# Patient Record
Sex: Female | Born: 1935 | Race: Black or African American | Hispanic: No | Smoking: Never smoker
Health system: Southern US, Community
[De-identification: ages and names within clinical notes are randomized; demographics above are authoritative.]

## PROBLEM LIST (undated history)

## (undated) DIAGNOSIS — R7611 Nonspecific reaction to tuberculin skin test without active tuberculosis: Secondary | ICD-10-CM

## (undated) DIAGNOSIS — D649 Anemia, unspecified: Secondary | ICD-10-CM

## (undated) DIAGNOSIS — E876 Hypokalemia: Secondary | ICD-10-CM

## (undated) DIAGNOSIS — I10 Essential (primary) hypertension: Secondary | ICD-10-CM

## (undated) DIAGNOSIS — M810 Age-related osteoporosis without current pathological fracture: Secondary | ICD-10-CM

## (undated) DIAGNOSIS — F0390 Unspecified dementia without behavioral disturbance: Secondary | ICD-10-CM

## (undated) DIAGNOSIS — I951 Orthostatic hypotension: Secondary | ICD-10-CM

## (undated) DIAGNOSIS — R7303 Prediabetes: Secondary | ICD-10-CM

## (undated) DIAGNOSIS — Z8781 Personal history of (healed) traumatic fracture: Secondary | ICD-10-CM

## (undated) HISTORY — PX: APPENDECTOMY: SHX54

---

## 2005-01-14 ENCOUNTER — Ambulatory Visit: Payer: Self-pay | Admitting: Family Medicine

## 2006-02-27 ENCOUNTER — Ambulatory Visit: Payer: Self-pay | Admitting: Nurse Practitioner

## 2007-03-11 ENCOUNTER — Ambulatory Visit: Payer: Self-pay | Admitting: Nurse Practitioner

## 2007-10-30 ENCOUNTER — Ambulatory Visit: Payer: Self-pay | Admitting: Gastroenterology

## 2008-03-29 ENCOUNTER — Ambulatory Visit: Payer: Self-pay | Admitting: Family Medicine

## 2009-03-30 ENCOUNTER — Ambulatory Visit: Payer: Self-pay | Admitting: Family Medicine

## 2010-04-03 ENCOUNTER — Ambulatory Visit: Payer: Self-pay | Admitting: Family Medicine

## 2010-05-10 ENCOUNTER — Ambulatory Visit: Payer: Self-pay | Admitting: Family Medicine

## 2010-05-18 ENCOUNTER — Ambulatory Visit: Payer: Self-pay | Admitting: Gynecologic Oncology

## 2010-05-29 ENCOUNTER — Ambulatory Visit: Payer: Self-pay | Admitting: Gynecologic Oncology

## 2010-06-01 LAB — PATHOLOGY REPORT

## 2010-06-18 ENCOUNTER — Ambulatory Visit: Payer: Self-pay | Admitting: Gynecologic Oncology

## 2011-05-31 ENCOUNTER — Ambulatory Visit: Payer: Self-pay | Admitting: Family Medicine

## 2012-06-02 ENCOUNTER — Ambulatory Visit: Payer: Self-pay | Admitting: Family Medicine

## 2013-06-03 ENCOUNTER — Ambulatory Visit: Payer: Self-pay | Admitting: Family Medicine

## 2014-05-25 ENCOUNTER — Ambulatory Visit: Payer: Self-pay | Admitting: Family Medicine

## 2014-06-06 ENCOUNTER — Ambulatory Visit: Payer: Self-pay | Admitting: Family Medicine

## 2016-11-01 ENCOUNTER — Emergency Department
Admission: EM | Admit: 2016-11-01 | Discharge: 2016-11-01 | Disposition: A | Discharge status: Home / Self Care | Payer: Medicare Other | Attending: Emergency Medicine | Admitting: Emergency Medicine

## 2016-11-01 ENCOUNTER — Emergency Department: Payer: Medicare Other

## 2016-11-01 ENCOUNTER — Encounter: Payer: Self-pay | Admitting: Emergency Medicine

## 2016-11-01 DIAGNOSIS — M79604 Pain in right leg: Secondary | ICD-10-CM | POA: Diagnosis present

## 2016-11-01 DIAGNOSIS — I10 Essential (primary) hypertension: Secondary | ICD-10-CM | POA: Diagnosis not present

## 2016-11-01 HISTORY — DX: Essential (primary) hypertension: I10
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
## 2016-11-24 ENCOUNTER — Encounter: Payer: Self-pay | Admitting: Emergency Medicine

## 2016-11-24 ENCOUNTER — Emergency Department
Admission: EM | Admit: 2016-11-24 | Discharge: 2016-11-24 | Disposition: A | Discharge status: Home / Self Care | Payer: Medicare Other | Attending: Emergency Medicine | Admitting: Emergency Medicine

## 2016-11-24 DIAGNOSIS — R4182 Altered mental status, unspecified: Secondary | ICD-10-CM | POA: Diagnosis present

## 2016-11-24 DIAGNOSIS — I10 Essential (primary) hypertension: Secondary | ICD-10-CM | POA: Insufficient documentation

## 2016-11-24 DIAGNOSIS — F0390 Unspecified dementia without behavioral disturbance: Secondary | ICD-10-CM | POA: Diagnosis not present

## 2016-11-24 LAB — BASIC METABOLIC PANEL
Anion gap: 8 (ref 5–15)
BUN: 15 mg/dL (ref 6–20)
CO2: 31 mmol/L (ref 22–32)
CREATININE: 0.62 mg/dL (ref 0.44–1.00)
Calcium: 11.9 mg/dL — ABNORMAL HIGH (ref 8.9–10.3)
Chloride: 105 mmol/L (ref 101–111)
GFR calc Af Amer: >60 mL/min (ref >60)
Glucose, Bld: 103 mg/dL — ABNORMAL HIGH (ref 65–99)
POTASSIUM: 3.4 mmol/L — AB (ref 3.5–5.1)
SODIUM: 144 mmol/L (ref 135–145)

## 2016-11-24 LAB — CBC WITH DIFFERENTIAL/PLATELET
BASOS PCT: 1 %
Basophils Absolute: 0.1 10*3/uL (ref 0–0.1)
EOS ABS: 0.1 10*3/uL (ref 0–0.7)
EOS PCT: 1 %
HCT: 47 % (ref 35.0–47.0)
Hemoglobin: 15.6 g/dL (ref 12.0–16.0)
LYMPHS ABS: 1.4 10*3/uL (ref 1.0–3.6)
Lymphocytes Relative: 17 %
MCH: 29.7 pg (ref 26.0–34.0)
MCHC: 33.3 g/dL (ref 32.0–36.0)
MCV: 89.2 fL (ref 80.0–100.0)
Monocytes Absolute: 0.4 10*3/uL (ref 0.2–0.9)
Monocytes Relative: 5 %
Neutro Abs: 6.2 10*3/uL (ref 1.4–6.5)
Neutrophils Relative %: 76 %
PLATELETS: 188 10*3/uL (ref 150–440)
RBC: 5.27 MIL/uL — AB (ref 3.80–5.20)
RDW: 15.2 % — ABNORMAL HIGH (ref 11.5–14.5)
WBC: 8.2 10*3/uL (ref 3.6–11.0)

## 2016-11-24 LAB — URINALYSIS, COMPLETE (UACMP) WITH MICROSCOPIC
Bacteria, UA: NONE SEEN
Bilirubin Urine: NEGATIVE
GLUCOSE, UA: NEGATIVE mg/dL
Ketones, ur: NEGATIVE mg/dL
Leukocytes, UA: NEGATIVE
Nitrite: NEGATIVE
PH: 8 (ref 5.0–8.0)
Protein, ur: 100 mg/dL — AB
Specific Gravity, Urine: 1.008 (ref 1.005–1.030)

## 2016-11-24 MED ORDER — LORAZEPAM 1 MG PO TABS: 1.0000 mg | ORAL_TABLET | Freq: Three times a day (TID) | ORAL | 0 refills | Status: DC | PRN

## 2016-11-24 NOTE — ED Notes (Signed)
Pt & family requesting psych eval while in ED today. EDP notified.
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
## 2016-11-24 NOTE — ED Notes (Signed)

## 2016-11-24 NOTE — Discharge Instructions (Signed)
Please seek medical attention for any high fevers, chest pain, shortness of breath, change in behavior, persistent vomiting, bloody stool or any other new or concerning symptoms.  
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
## 2016-12-03 ENCOUNTER — Encounter: Payer: Self-pay | Admitting: Emergency Medicine

## 2016-12-03 ENCOUNTER — Emergency Department
Admission: EM | Admit: 2016-12-03 | Discharge: 2016-12-05 | Disposition: A | Discharge status: Home / Self Care | Payer: Medicare Other | Attending: Emergency Medicine | Admitting: Emergency Medicine

## 2016-12-03 DIAGNOSIS — Z5181 Encounter for therapeutic drug level monitoring: Secondary | ICD-10-CM | POA: Insufficient documentation

## 2016-12-03 DIAGNOSIS — G309 Alzheimer's disease, unspecified: Secondary | ICD-10-CM

## 2016-12-03 DIAGNOSIS — F22 Delusional disorders: Secondary | ICD-10-CM

## 2016-12-03 DIAGNOSIS — F0280 Dementia in other diseases classified elsewhere without behavioral disturbance: Secondary | ICD-10-CM

## 2016-12-03 DIAGNOSIS — I10 Essential (primary) hypertension: Secondary | ICD-10-CM | POA: Diagnosis not present

## 2016-12-03 HISTORY — DX: Age-related osteoporosis without current pathological fracture: M81.0

## 2016-12-03 HISTORY — DX: Prediabetes: R73.03

## 2016-12-03 HISTORY — DX: Unspecified dementia, unspecified severity, without behavioral disturbance, psychotic disturbance, mood disturbance, and anxiety: F03.90

## 2016-12-03 HISTORY — DX: Hypokalemia: E87.6

## 2016-12-03 HISTORY — DX: Hypercalcemia: E83.52

## 2016-12-03 LAB — COMPREHENSIVE METABOLIC PANEL
ALBUMIN: 4.3 g/dL (ref 3.5–5.0)
ALK PHOS: 83 U/L (ref 38–126)
ALT: 13 U/L — ABNORMAL LOW (ref 14–54)
ANION GAP: 7 (ref 5–15)
AST: 22 U/L (ref 15–41)
BUN: 16 mg/dL (ref 6–20)
CALCIUM: 11.4 mg/dL — AB (ref 8.9–10.3)
CO2: 29 mmol/L (ref 22–32)
CREATININE: 0.66 mg/dL (ref 0.44–1.00)
Chloride: 106 mmol/L (ref 101–111)
GFR calc Af Amer: >60 mL/min (ref >60)
GFR calc non Af Amer: >60 mL/min (ref >60)
GLUCOSE: 157 mg/dL — AB (ref 65–99)
Potassium: 3.7 mmol/L (ref 3.5–5.1)
SODIUM: 142 mmol/L (ref 135–145)
Total Bilirubin: 0.6 mg/dL (ref 0.3–1.2)
Total Protein: 7.5 g/dL (ref 6.5–8.1)

## 2016-12-03 LAB — URINALYSIS, ROUTINE W REFLEX MICROSCOPIC
BILIRUBIN URINE: NEGATIVE
Glucose, UA: NEGATIVE mg/dL
KETONES UR: NEGATIVE mg/dL
Nitrite: NEGATIVE
Protein, ur: NEGATIVE mg/dL
SPECIFIC GRAVITY, URINE: 1.012 (ref 1.005–1.030)
pH: 7 (ref 5.0–8.0)

## 2016-12-03 LAB — CBC
HEMATOCRIT: 47.6 % — AB (ref 35.0–47.0)
HEMOGLOBIN: 15.7 g/dL (ref 12.0–16.0)
MCH: 29.8 pg (ref 26.0–34.0)
MCHC: 32.9 g/dL (ref 32.0–36.0)
MCV: 90.6 fL (ref 80.0–100.0)
Platelets: 166 10*3/uL (ref 150–440)
RBC: 5.25 MIL/uL — ABNORMAL HIGH (ref 3.80–5.20)
RDW: 14.8 % — ABNORMAL HIGH (ref 11.5–14.5)
WBC: 9.6 10*3/uL (ref 3.6–11.0)

## 2016-12-03 LAB — ETHANOL: Alcohol, Ethyl (B): <5 mg/dL (ref <5)

## 2016-12-03 LAB — URINE DRUG SCREEN, QUALITATIVE (ARMC ONLY)
Amphetamines, Ur Screen: NOT DETECTED
BARBITURATES, UR SCREEN: NOT DETECTED
Benzodiazepine, Ur Scrn: NOT DETECTED
COCAINE METABOLITE, UR ~~LOC~~: NOT DETECTED
Cannabinoid 50 Ng, Ur ~~LOC~~: NOT DETECTED
MDMA (Ecstasy)Ur Screen: NOT DETECTED
METHADONE SCREEN, URINE: NOT DETECTED
OPIATE, UR SCREEN: NOT DETECTED
Phencyclidine (PCP) Ur S: NOT DETECTED
Tricyclic, Ur Screen: NOT DETECTED

## 2016-12-03 LAB — ACETAMINOPHEN LEVEL

## 2016-12-03 LAB — SALICYLATE LEVEL: Salicylate Lvl: <7 mg/dL (ref 2.8–30.0)
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
## 2016-12-03 NOTE — ED Notes (Signed)
Patient dressed out into hospital scrubs and clothes put in bag.  Bag with clothes and purse given to sister to take home.  Sister in room during process.
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
## 2016-12-04 DIAGNOSIS — G309 Alzheimer's disease, unspecified: Secondary | ICD-10-CM

## 2016-12-04 DIAGNOSIS — G301 Alzheimer's disease with late onset: Secondary | ICD-10-CM

## 2016-12-04 DIAGNOSIS — F0280 Dementia in other diseases classified elsewhere without behavioral disturbance: Secondary | ICD-10-CM
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
## 2016-12-04 NOTE — ED Notes (Signed)
Patient ambulatory to bathroom. Patient now back resting in the bed

## 2016-12-04 NOTE — ED Notes (Signed)
Resumed care of pt at this time   Paperwork shows that she will discharge to home
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
## 2016-12-04 NOTE — ED Notes (Signed)
IVC  RESCINDED  PER  DR  CLAPACS 
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
## 2016-12-04 NOTE — ED Notes (Signed)
BEHAVIORAL HEALTH ROUNDING Patient sleeping: No. Patient alert and oriented: yes Behavior appropriate: Yes.  ; If no, describe:  Nutrition and fluids offered: yes Toileting and hygiene offered: Yes  Sitter present: q15 minute observations and security  monitoring Law enforcement present: Yes  ODS  

## 2016-12-04 NOTE — ED Provider Notes (Signed)
Patient has been cleared by psychiatry for outpatient follow-up. She is not felt to be a threat to herself or others at this time.   Yesenia FilbertJonathan E Williams, MD 12/04/16 [phone]

## 2016-12-05 NOTE — ED Notes (Signed)
Per Larita FifeLynn, CSW, sister states they will try to pick up pt this afternoon, but it may be tomorrow, based on the weather and road situation.

## 2016-12-05 NOTE — ED Notes (Signed)
Pt discharged home after verbalizing understanding of discharge instructions; nad noted. 
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
## 2016-12-05 NOTE — ED Notes (Signed)
Pt's sister called to check on pt. States she cannot get here to pick pt up and wanted to know situation. Told her that a social work consult has been ordered. Her name is Isabella BowensLacheata Hall, phone [phone]

## 2016-12-05 NOTE — ED Notes (Signed)
Resumed care from [place], [place]. Pt resting comfortably. Will continue to monitor.
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
## 2016-12-05 NOTE — ED Notes (Signed)
Pt. ambulates without assistance.

## 2016-12-05 NOTE — ED Notes (Signed)
PT  VOL  PENDING  D/C ?

## 2016-12-10 ENCOUNTER — Encounter: Payer: Self-pay | Admitting: Emergency Medicine

## 2016-12-10 ENCOUNTER — Emergency Department
Admission: EM | Admit: 2016-12-10 | Discharge: 2016-12-14 | Disposition: A | Discharge status: Home / Self Care | Payer: Medicare Other | Attending: Emergency Medicine | Admitting: Emergency Medicine

## 2016-12-10 DIAGNOSIS — Z5900 Homelessness unspecified: Secondary | ICD-10-CM

## 2016-12-10 DIAGNOSIS — R509 Fever, unspecified: Secondary | ICD-10-CM | POA: Insufficient documentation

## 2016-12-10 DIAGNOSIS — I10 Essential (primary) hypertension: Secondary | ICD-10-CM | POA: Insufficient documentation

## 2016-12-10 DIAGNOSIS — R197 Diarrhea, unspecified: Secondary | ICD-10-CM | POA: Diagnosis not present

## 2016-12-10 DIAGNOSIS — Z7982 Long term (current) use of aspirin: Secondary | ICD-10-CM | POA: Diagnosis not present

## 2016-12-10 DIAGNOSIS — F0280 Dementia in other diseases classified elsewhere without behavioral disturbance: Secondary | ICD-10-CM | POA: Insufficient documentation

## 2016-12-10 DIAGNOSIS — R2681 Unsteadiness on feet: Secondary | ICD-10-CM | POA: Insufficient documentation

## 2016-12-10 DIAGNOSIS — R109 Unspecified abdominal pain: Secondary | ICD-10-CM | POA: Diagnosis not present

## 2016-12-10 DIAGNOSIS — Z79899 Other long term (current) drug therapy: Secondary | ICD-10-CM | POA: Insufficient documentation

## 2016-12-10 DIAGNOSIS — N289 Disorder of kidney and ureter, unspecified: Secondary | ICD-10-CM

## 2016-12-10 DIAGNOSIS — G308 Other Alzheimer's disease: Secondary | ICD-10-CM | POA: Diagnosis not present

## 2016-12-10 DIAGNOSIS — N390 Urinary tract infection, site not specified: Secondary | ICD-10-CM

## 2016-12-10 DIAGNOSIS — F0390 Unspecified dementia without behavioral disturbance: Secondary | ICD-10-CM

## 2016-12-10 LAB — CBC
HCT: 44.3 % (ref 35.0–47.0)
HEMOGLOBIN: 14.7 g/dL (ref 12.0–16.0)
MCH: 29.8 pg (ref 26.0–34.0)
MCHC: 33.3 g/dL (ref 32.0–36.0)
MCV: 89.7 fL (ref 80.0–100.0)
Platelets: 153 10*3/uL (ref 150–440)
RBC: 4.94 MIL/uL (ref 3.80–5.20)
RDW: 14.8 % — ABNORMAL HIGH (ref 11.5–14.5)
WBC: 6.8 10*3/uL (ref 3.6–11.0)

## 2016-12-10 LAB — COMPREHENSIVE METABOLIC PANEL
ALK PHOS: 65 U/L (ref 38–126)
ALT: 19 U/L (ref 14–54)
ANION GAP: 8 (ref 5–15)
AST: 30 U/L (ref 15–41)
Albumin: 4 g/dL (ref 3.5–5.0)
BUN: 36 mg/dL — ABNORMAL HIGH (ref 6–20)
CALCIUM: 10.8 mg/dL — AB (ref 8.9–10.3)
CO2: 26 mmol/L (ref 22–32)
Chloride: 107 mmol/L (ref 101–111)
Creatinine, Ser: 1.1 mg/dL — ABNORMAL HIGH (ref 0.44–1.00)
GFR, EST AFRICAN AMERICAN: 50 mL/min — AB (ref >60)
GFR, EST NON AFRICAN AMERICAN: 43 mL/min — AB (ref >60)
Glucose, Bld: 136 mg/dL — ABNORMAL HIGH (ref 65–99)
Potassium: 3.5 mmol/L (ref 3.5–5.1)
SODIUM: 141 mmol/L (ref 135–145)
Total Bilirubin: 0.9 mg/dL (ref 0.3–1.2)
Total Protein: 7.1 g/dL (ref 6.5–8.1)

## 2016-12-10 LAB — LIPASE, BLOOD: LIPASE: 14 U/L (ref 11–51)

## 2016-12-10 LAB — INFLUENZA PANEL BY PCR (TYPE A & B)
INFLAPCR: NEGATIVE
INFLBPCR: NEGATIVE

## 2016-12-10 MED ORDER — SODIUM CHLORIDE 0.9 % IV BOLUS (SEPSIS)
1000.0000 mL | Freq: Once | INTRAVENOUS | Status: AC
Start: 2016-12-10 — End: 2016-12-10
  Administered 2016-12-10: 1000 mL via INTRAVENOUS
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
## 2016-12-10 NOTE — NC FL2 (Signed)
  [place] MEDICAID FL2 LEVEL OF CARE SCREENING TOOL     IDENTIFICATION  Patient Name: Izora GalaDoris O Moon Birthdate: Sep 30, 1924 Sex: female Admission Date (Current Location): 12/10/2016  [place]   Facility and Address:  [facility], [address], [place], [place] [address]      Provider Number: [id number]  Attending Physician Name and Address:  Rockne MenghiniAnne-Caroline Norman, MD  Relative Name and Phone Number:       Current Level of Care: Hospital Recommended Level of Care: Assisted Living Facility Prior Approval Number:    Date Approved/Denied:  12/10/16 PASRR Number:   [id number]   Discharge Plan: Other (Comment) (Assisted Living Facility )    Current Diagnoses: Patient Active Problem List   Diagnosis Date Noted  . Alzheimer's dementia 12/04/2016    Orientation RESPIRATION BLADDER Height & Weight     Self, Place (Dementia diagnosis )  Normal Incontinent Weight: 130 lb (59 kg) Height:     BEHAVIORAL SYMPTOMS/MOOD NEUROLOGICAL BOWEL NUTRITION STATUS     (None) Continent Diet (Regular )  AMBULATORY STATUS COMMUNICATION OF NEEDS Skin   Limited Assist Verbally Normal                       Personal Care Assistance Level of Assistance  Bathing, Feeding, Dressing Bathing Assistance: Limited assistance Feeding assistance: Independent Dressing Assistance: Limited assistance     Functional Limitations Info  Sight, Hearing, Speech Sight Info: Adequate Hearing Info: Impaired (Pt is hard of hearing ) Speech Info: Adequate    SPECIAL CARE FACTORS FREQUENCY                       Contractures Contractures Info: Not present    Additional Factors Info  Isolation Precautions         Isolation Precautions Info: Enteric precautions (UV disinfection)     Current Medications (12/10/2016):  This is the current hospital active medication list Current Facility-Administered Medications  Medication Dose Route  Frequency Provider Last Rate Last Dose  . sodium chloride 0.9 % bolus 1,000 mL  1,000 mL Intravenous Once Rockne MenghiniAnne-Caroline Norman, MD       Current Outpatient Prescriptions  Medication Sig Dispense Refill  . alendronate (FOSAMAX) 70 MG tablet Take 70 mg by mouth once a week. Take with a full glass of water on an empty stomach.    Marland Kitchen. amLODipine (NORVASC) 10 MG tablet Take 10 mg by mouth daily.    Marland Kitchen. aspirin 81 MG chewable tablet Chew 81 mg by mouth daily.    . carvedilol (COREG) 3.125 MG tablet Take 3.125 mg by mouth 2 (two) times daily with a meal.    . cholecalciferol (VITAMIN D) 1000 units tablet Take 1,000 Units by mouth daily.    . Fluocinolone Acetonide 0.01 % OIL Place 1 application in ear(s) daily.    Marland Kitchen. lisinopril (PRINIVIL,ZESTRIL) 20 MG tablet Take 40 mg by mouth daily.    Marland Kitchen. LORazepam (ATIVAN) 1 MG tablet Take 1 tablet (1 mg total) by mouth every 8 (eight) hours as needed (agitation). (Patient not taking: Reported on 12/04/2016) 20 tablet 0  . potassium chloride 20 MEQ/15ML (10%) SOLN Take 60 mEq by mouth daily.       Discharge Medications: Please see discharge summary for a list of discharge medications.  Relevant Imaging Results:  Relevant Lab Results:   Additional Information SSN: [id number]  Jonathon JordanLynn B Amilia Vandenbrink, LCSWA
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
## 2016-12-10 NOTE — ED Notes (Addendum)
Pt assisted to restroom by [name], tech, unable to catch urine sample.

## 2016-12-10 NOTE — ED Triage Notes (Signed)
Pt presents with diarrhea for three days. Pt is homeless and needs a social work consult. Pt brought in daughter who is homeless as well and has been living in a hotel.
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
## 2016-12-11 NOTE — ED Notes (Signed)
Pt used bathroom (urine) behind room door. Floor cleaned and pt linens changed. Pt sitting at foot of the bed.

## 2016-12-11 NOTE — ED Notes (Signed)
Melody from [facility] came to visit the pt. CSW was present during the visit. Melody believes that pt would be a good fit for the facility. Pt could possibly transfer to facility tomorrow or Thursday. Melody will review pt's case with her team and get back to CSW with a final answer tomorrow.   Jonathon JordanLynn B Seif Teichert, MSW, [name] [phone]

## 2016-12-11 NOTE — ED Notes (Signed)
Yesenia Leonard received a visit from Yesenia Leonard with DSS. CSW was present at the time of the visit. Yesenia Leonard completed a mental status exam on the Yesenia Leonard. CSW and Yesenia Leonard discussed placement options with the Yesenia Leonard.   CSW called [facility] at 2:39pm, via phone number [phone], to inquire about when they would be coming to assess the Yesenia Leonard. Team members were in a meeting at the time. CSW left a message with the receptionist and is awaiting a call back.  Yesenia Leonard, MSW, [name] [phone]

## 2016-12-11 NOTE — ED Notes (Signed)
According to pt's sister, Yesenia Leonard [phone], pt's daughter left the ED last night and is now staying with a cousin.   Per [name] at [facility], pt's referral is currently being considered. [facility] would have an available bed for the pt on Friday. Pt's referral is also being considered by [facility]. Christian (admission Interior and spatial designerdirector at [facility]) will be coming to the ED today around 2 or 2:30 to assess the pt for appropriateness.   CSW received call from Cory MunchJanay Powell with APS [phone]). CSW explained that pt remains in the ED however, pt's daughter is no longer in the ED. CSW informed Marijo ConceptionJanay of above and stated that she would keep her posted on pt's status. Marijo ConceptionJanay thanked CSW for her assistance.  [name], MSW, [name] [phone]

## 2016-12-11 NOTE — ED Notes (Signed)
Spoke with a lady by the name of Guy Sandiferat Borbon that reports that this pts daughter is living with her - she spoke with the pt on the phone for more than 15 minutes  The pt now reports that she can go live with her sister in law Bethann Gooatty     Pat Helmuth  [phone]

## 2016-12-11 NOTE — ED Notes (Signed)
Vitals attempted by tech and RN, patient refused. Pt walking around wanting her dad to be called.

## 2016-12-11 NOTE — ED Notes (Signed)
Pt sitting on endof bed looking through her calendar. Pt is being cooperative.

## 2016-12-11 NOTE — ED Notes (Signed)
Pt given lunch tray, but refusing to eat. States she doesn't think she is going to do that.

## 2016-12-11 NOTE — ED Notes (Signed)
PT VOLUNTARY PENDING PLACEMENT. 

## 2016-12-11 NOTE — ED Notes (Signed)
She is sitting at the foot of the bed  - NAD observed  States to me  "I am ready to go home - I need to help my daughter  I am supposed to live with her."  Pt reassured  toileting offered  - I introduced myself to her
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
## 2016-12-12 ENCOUNTER — Emergency Department: Payer: Medicare Other

## 2016-12-12 LAB — URINALYSIS, COMPLETE (UACMP) WITH MICROSCOPIC
Bilirubin Urine: NEGATIVE
GLUCOSE, UA: NEGATIVE mg/dL
Ketones, ur: NEGATIVE mg/dL
NITRITE: NEGATIVE
PROTEIN: 100 mg/dL — AB
Specific Gravity, Urine: 1.018 (ref 1.005–1.030)
pH: 5 (ref 5.0–8.0)

## 2016-12-12 MED ORDER — CEPHALEXIN 500 MG PO CAPS
500.0000 mg | ORAL_CAPSULE | Freq: Four times a day (QID) | ORAL | Status: DC
  Administered 2016-12-12 – 2016-12-14 (×10): 500 mg via ORAL
  Filled 2016-12-12 (×10): qty 1

## 2016-12-12 MED ORDER — POTASSIUM CHLORIDE 20 MEQ/15ML (10%) PO SOLN
60.0000 meq | Freq: Every day | ORAL | Status: DC
  Administered 2016-12-12 – 2016-12-13 (×2): 60 meq via ORAL
  Filled 2016-12-12 (×3): qty 45

## 2016-12-12 MED ORDER — AMLODIPINE BESYLATE 5 MG PO TABS
10.0000 mg | ORAL_TABLET | Freq: Every day | ORAL | Status: DC
  Administered 2016-12-13: 10 mg via ORAL
  Filled 2016-12-12 (×3): qty 2

## 2016-12-12 MED ORDER — CARVEDILOL 6.25 MG PO TABS
3.1250 mg | ORAL_TABLET | Freq: Two times a day (BID) | ORAL | Status: DC
  Administered 2016-12-13 – 2016-12-14 (×3): 3.125 mg via ORAL
  Filled 2016-12-12 (×3): qty 1

## 2016-12-12 MED ORDER — VITAMIN D 1000 UNITS PO TABS
1000.0000 [IU] | ORAL_TABLET | Freq: Every day | ORAL | Status: DC
  Administered 2016-12-12 – 2016-12-14 (×3): 1000 [IU] via ORAL
  Filled 2016-12-12 (×3): qty 1

## 2016-12-12 MED ORDER — ASPIRIN 81 MG PO CHEW
81.0000 mg | CHEWABLE_TABLET | Freq: Every day | ORAL | Status: DC
  Administered 2016-12-12 – 2016-12-14 (×3): 81 mg via ORAL
  Filled 2016-12-12 (×3): qty 1

## 2016-12-12 MED ORDER — LISINOPRIL 20 MG PO TABS
40.0000 mg | ORAL_TABLET | Freq: Every day | ORAL | Status: DC
  Administered 2016-12-13: 40 mg via ORAL
  Filled 2016-12-12 (×2): qty 2

## 2016-12-12 MED ORDER — LATANOPROST 0.005 % OP SOLN
1.0000 [drp] | Freq: Every day | OPHTHALMIC | Status: DC
  Administered 2016-12-12 – 2016-12-13 (×2): 1 [drp] via OPHTHALMIC
  Filled 2016-12-12: qty 2.5

## 2016-12-12 MED ORDER — LORAZEPAM 1 MG PO TABS
1.0000 mg | ORAL_TABLET | Freq: Three times a day (TID) | ORAL | Status: DC | PRN
Start: 2016-12-12 — End: 2016-12-15

## 2016-12-12 NOTE — ED Notes (Signed)
Pt is asleep; rise and fall of chest noted; lights are out, tv is on with low volume; stretcher locked in lowest position.

## 2016-12-12 NOTE — ED Notes (Signed)
Daughter wanda called. Let her speak with pt. Daughters phone number is [phone]

## 2016-12-12 NOTE — ED Notes (Signed)
All clothing secured. Only other belongings were ring. Left on pt to wear, did not remove.
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
## 2016-12-12 NOTE — ED Notes (Signed)
Attempted to call daughter [name] at [phone] to ask when the patient last had her Fosamax; there was no answer; no voicemail available to leave message.  Will attempt to call again at a later time.

## 2016-12-12 NOTE — ED Notes (Signed)
Patient back from x-ray 

## 2016-12-12 NOTE — ED Notes (Signed)
Urine sample sent to lab for UA.

## 2016-12-12 NOTE — ED Notes (Signed)
Pt given breakfast tray and juice. 

## 2016-12-12 NOTE — ED Notes (Signed)
Patients niece called on phone. Pt given phone to talk to niece to see if can help re-orient her. Pt willing to speak with her.

## 2016-12-12 NOTE — ED Notes (Addendum)
Pt put the styrofoam food tray box on the floor and attempted to void in that box, pt ended up voiding on the floor in the room; Ed tech helped the patient clean up, change clothes and get her back to bed; pt offered liquids; pt refused.

## 2016-12-12 NOTE — ED Notes (Addendum)
RN at bedside with tech to attempt to obtain blood sample. Pt very agitated and confused at this time. Will not let anyone touch her or draw blood. Keeps stating "what is this place". Attempted to re-orient pt. Pt states "i need to go to my daughters funeral, you know I have a husband".

## 2016-12-12 NOTE — ED Provider Notes (Signed)
Patient remains medically stable for psychiatric or social work disposition or placement Vitals:   12/12/16 0548 12/12/16 0740  BP: (!) 173/108 (!) 148/92  Pulse: (!) 109   Resp: 18   Temp: 97.5 F (36.4 C)       Emily FilbertJonathan E Williams, MD 12/12/16 [phone]

## 2016-12-12 NOTE — Clinical Social Work Note (Signed)
Yesenia Leonard stated that [facility] but they will require a chest xray that specifically is read to rule out TB or a PPD that would have to be initiated and read prior to patient coming to their facility. In addition, she stated that the patient's DSS APS caseworker would have to verify that she has applied for medicaid. CSW contacted the DSS APS caseworker: Yesenia MunchJanay Leonard: [phone] and she stated she was going to apply at 1pm today.  York SpanielMonica Eleisha Leonard MSW,LCSW [phone]

## 2016-12-12 NOTE — ED Notes (Signed)
Pt drank two cups of water and still had pills in her mouth and had not swallowed them.  With the third cup of water, pt finally swallowed the pills.Pt seems to have difficulty swallowing medications with water; may consider administering medication with applesauce for the next administration or administration one pill at a time instead of all of them at once.

## 2016-12-12 NOTE — ED Notes (Signed)
Lunch was given to patient ,patient refused ,but placed on side of sink

## 2016-12-12 NOTE — ED Notes (Addendum)
Pt is cooperative; alert and oriented to self; pt asked about taking her misplaced blood pressure medication; this RN told the patient that we will provide her with all her needed medications.

## 2016-12-12 NOTE — ED Notes (Signed)
Attempted again to let RN draw blood. Pt refusing stating "Yesenia Leonard drew her blood".
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
## 2016-12-12 NOTE — ED Notes (Signed)
Given meal tray.

## 2016-12-12 NOTE — ED Notes (Signed)
PT VOLUNTARY PENDING PLACEMENT. 

## 2016-12-12 NOTE — ED Notes (Signed)
Patient transported to X-ray 

## 2016-12-12 NOTE — ED Notes (Signed)
Pt given paper scrubs and changed out of clothes. Cleaned up. Given toothbrush/toothpaste to brush teeth. Offered drink, declined.

## 2016-12-12 NOTE — ED Notes (Signed)
A printed result page from lab confirmed collection date of Urine is 12/12/16, not what is indicated in chart date of 12/10/16. Paper results placed on chart.

## 2016-12-13 NOTE — ED Notes (Addendum)
Pt assisted in shower by Crystal, NT. Switched to a hospital bed. Pt now on phone with daughter.

## 2016-12-13 NOTE — Evaluation (Signed)
Physical Therapy Evaluation Patient Details Name: Yesenia Leonard MRN: [id number] DOB: [birth year] Today's Date: 12/13/2016   History of Present Illness  Pt is a 83 y/o F who presented to the ED with recent h/o diarrhea.  Pt's PMH includes dementia, osteoporosis.  Clinical Impression  Pt currently with functional limitations due to the deficits listed below (see PT Problem List). Yesenia Leonard was unable to provide information on PLOF or home layout and no family present at time of PT evaluation.  She currently requires min assist via 1 person HHA to remain steady with sit<>stand transfers and ambulation.  Given pt's h/o dementia and current mobility status, she will need 24/7 supervision/assist at d/c.  Pt will benefit from skilled PT to increase their independence and safety with mobility to allow discharge to the venue listed below.      Follow Up Recommendations SNF;Supervision/Assistance - 24 hour    Equipment Recommendations  Other (comment) (TBD at next venue of care)    Recommendations for Other Services       Precautions / Restrictions Precautions Precautions: Fall Restrictions Weight Bearing Restrictions: No      Mobility  Bed Mobility Overal bed mobility: Needs Assistance Bed Mobility: Supine to Sit;Sit to Supine     Supine to sit: Min guard;HOB elevated Sit to supine: Min guard;HOB elevated   General bed mobility comments: Pt requires increased time  Transfers Overall transfer level: Needs assistance Equipment used: 1 person hand held assist Transfers: Sit to/from Stand Sit to Stand: Min assist         General transfer comment: Assist via 1 person HHA as pt unsteady and begins reaching out for something to hold onto during sit>stand while bracing legs against side of bed to prevent posterior lean.  Ambulation/Gait Ambulation/Gait assistance: Min assist Ambulation Distance (Feet): 125 Feet Assistive device: 1 person hand held assist Gait Pattern/deviations:  Step-through pattern;Decreased stride length;Trunk flexed;Narrow base of support Gait velocity: decreased Gait velocity interpretation: Below normal speed for age/gender General Gait Details: Pt unsteady and needs guidance and support via 1 person HHA to remain steady and to direct her around obstacles in the hallway.    Stairs            Wheelchair Mobility    Modified Rankin (Stroke Patients Only)       Balance Overall balance assessment: Needs assistance Sitting-balance support: No upper extremity supported;Feet supported Sitting balance-Leahy Scale: Good     Standing balance support: Single extremity supported;During functional activity Standing balance-Leahy Scale: Poor Standing balance comment: Relies on UE support to remain steady with static and dynamic activities                             Pertinent Vitals/Pain Pain Assessment: No/denies pain    Home Living Family/patient expects to be discharged to:: Skilled nursing facility Living Arrangements: Children (daughter)               Additional Comments: SW in contact with APS and searching for other alternative options for pt at d/c.    Prior Function Level of Independence: Needs assistance         Comments: Pt unable to provide reliable information and no family present to confirm information.  Pt asked if she uses a cane or walker when ambulating and she replies "Yes".  Then when asked if she has a cane she responds, "No" and if she has a walker responds "No".  Hand Dominance        Extremity/Trunk Assessment   Upper Extremity Assessment Upper Extremity Assessment:  (BUE strength grossly 3+/5)    Lower Extremity Assessment Lower Extremity Assessment:  (BLE strength grossly 4/5)    Cervical / Trunk Assessment Cervical / Trunk Assessment: Kyphotic  Communication   Communication: No difficulties  Cognition Arousal/Alertness: Awake/alert Behavior During Therapy: WFL for  tasks assessed/performed (very pleasant) Overall Cognitive Status: History of cognitive impairments - at baseline                 General Comments: oriented to self only    General Comments General comments (skin integrity, edema, etc.): 5xSTS: 20.51 seconds indicating pt at increased risk of falling with impaired LE strength.     Exercises     Assessment/Plan    PT Assessment Patient needs continued PT services  PT Problem List Decreased strength;Decreased balance;Decreased knowledge of use of DME;Decreased safety awareness          PT Treatment Interventions DME instruction;Gait training;Stair training;Functional mobility training;Therapeutic exercise;Therapeutic activities;Balance training;Neuromuscular re-education;Cognitive remediation;Patient/family education    PT Goals (Current goals can be found in the Care Plan section)  Acute Rehab PT Goals Patient Stated Goal: none stated PT Goal Formulation: With patient Time For Goal Achievement: 12/27/16 Potential to Achieve Goals: Good    Frequency Min 2X/week   Barriers to discharge Decreased caregiver support Unsure of assist available from family at d/c    Co-evaluation               End of Session Equipment Utilized During Treatment: Gait belt Activity Tolerance: Patient tolerated treatment well Patient left: in bed;Other (comment) (nurse tech just outside room) Nurse Communication: Mobility status    Functional Assessment Tool Used: Clinical Judgement Functional Limitation: Mobility: Walking and moving around Mobility: Walking and Moving Around Current Status (B3419): At least 20 percent but less than 40 percent impaired, limited or restricted Mobility: Walking and Moving Around Goal Status [phone]): At least 20 percent but less than 40 percent impaired, limited or restricted    Time: 1535-1545 PT Time Calculation (min) (ACUTE ONLY): 10 min   Charges:   PT Evaluation $PT Eval Low Complexity: 1  Procedure     PT G Codes:   PT G-Codes **NOT FOR INPATIENT CLASS** Functional Assessment Tool Used: Clinical Judgement Functional Limitation: Mobility: Walking and moving around Mobility: Walking and Moving Around Current Status (I0973): At least 20 percent but less than 40 percent impaired, limited or restricted Mobility: Walking and Moving Around Goal Status [phone]): At least 20 percent but less than 40 percent impaired, limited or restricted    Encarnacion Chu PT, DPT 12/13/2016, 4:17 PM

## 2016-12-13 NOTE — ED Notes (Signed)

## 2016-12-13 NOTE — ED Notes (Signed)
Pt given meal tray. Pt encouraged to eat.

## 2016-12-13 NOTE — ED Notes (Signed)
PT at bedside.

## 2016-12-13 NOTE — ED Notes (Signed)
Pt got up a couple of times asking about her children and her dog; pt was redirected back to bed; pt is calm and cooperative and is sleeping at this time.  Toiletting, nutrition and fluids offered.

## 2016-12-13 NOTE — Progress Notes (Signed)
LCSW re-did patients Fl2/Passr, awaiting [name] signature, patient information and EDP notes and ED Psychiatry notes already provided and [facility]. Number was issued [phone].  LCSW called ED RN and requested a PT consult to assist with possible weekend placement. Patient does have medicare as per Ms Marlene Lardhonda Lambert Finance office [phone]. Once all documentation signed PT consult done LCSW will refax info via the HUB to SNF- Memory care.  LCSW consulted with Jerrye NobleJonay Powell [phone]  Arrie Senatelaudine Giorgi Debruin LCSW [phone]

## 2016-12-13 NOTE — ED Notes (Signed)
BEHAVIORAL HEALTH ROUNDING  Patient sleeping: No.  Patient alert and oriented: yes at her baseline Behavior appropriate: Yes. ; If no, describe:  Nutrition and fluids offered: Yes  Toileting and hygiene offered: Yes  Sitter present: not applicable, Q 15 min safety rounds and observation.  Law enforcement present: Yes ODS

## 2016-12-13 NOTE — NC FL2 (Signed)
[place] MEDICAID FL2 LEVEL OF CARE SCREENING TOOL     IDENTIFICATION  Patient Name: Yesenia Leonard Birthdate: [birth year] Sex: female Admission Date (Current Location): 12/10/2016  [place] and [place] Number:  Chiropodist and Address:  [facility], [address] [place], [place] [address]      Provider Number: [id number]  Attending Physician Name and Address:  No att. providers found  Relative Name and Phone Number:       Current Level of Care: Hospital Recommended Level of Care: Memory Care, Skilled Nursing Facility, LTCF Prior Approval Number:    Date Approved/Denied:   PASRR Number:  [id number]  Discharge Plan: SNF,LTCF    Current Diagnoses: Patient Active Problem List   Diagnosis Date Noted  . Alzheimer's dementia 12/04/2016    Orientation RESPIRATION BLADDER Height & Weight     Self, Time  Normal Incontinent Weight: 130 lb (59 kg) Height:     BEHAVIORAL SYMPTOMS/MOOD NEUROLOGICAL BOWEL NUTRITION STATUS     (None) Continent Diet (Regular)  AMBULATORY STATUS COMMUNICATION OF NEEDS Skin   Limited Assist Verbally Normal                       Personal Care Assistance Level of Assistance  Bathing, Feeding, Dressing Bathing Assistance: Limited assistance Feeding assistance: Limited assistance Dressing Assistance: Limited assistance     Functional Limitations Info  Sight, Hearing, Speech Sight Info: Adequate Hearing Info: Impaired (Patient is hard of hearing) Speech Info: Adequate    SPECIAL CARE FACTORS FREQUENCY                       Contractures Contractures Info: Not present    Additional Factors Info  Isolation Precautions         Isolation Precautions Info: Enteric precautions (UV disinfection)     Current Medications (12/13/2016):  This is the current hospital active medication list Current Facility-Administered Medications  Medication Dose Route Frequency Provider Last Rate Last Dose   . amLODipine (NORVASC) tablet 10 mg  10 mg Oral Daily Phineas Semen, MD   10 mg at 12/13/16 0944  . aspirin chewable tablet 81 mg  81 mg Oral Daily Phineas Semen, MD   81 mg at 12/13/16 0944  . carvedilol (COREG) tablet 3.125 mg  3.125 mg Oral BID WC Phineas Semen, MD   3.125 mg at 12/13/16 0803  . cephALEXin (KEFLEX) capsule 500 mg  500 mg Oral Q6H Emily Filbert, MD   500 mg at 12/13/16 0803  . cholecalciferol (VITAMIN D) tablet 1,000 Units  1,000 Units Oral Daily Phineas Semen, MD   1,000 Units at 12/13/16 0944  . latanoprost (XALATAN) 0.005 % ophthalmic solution 1 drop  1 drop Both Eyes QHS Phineas Semen, MD   1 drop at 12/12/16 2233  . lisinopril (PRINIVIL,ZESTRIL) tablet 40 mg  40 mg Oral Daily Phineas Semen, MD   40 mg at 12/13/16 0944  . LORazepam (ATIVAN) tablet 1 mg  1 mg Oral Q8H PRN Phineas Semen, MD      . potassium chloride 20 MEQ/15ML (10%) solution 60 mEq  60 mEq Oral Daily Phineas Semen, MD   60 mEq at 12/13/16 5621   Current Outpatient Prescriptions  Medication Sig Dispense Refill  . alendronate (FOSAMAX) 70 MG tablet Take 70 mg by mouth once a week. Take with a full glass of water on an empty stomach.    Marland Kitchen amLODipine (NORVASC) 10 MG tablet Take  10 mg by mouth daily.    Marland Kitchen. aspirin 81 MG chewable tablet Chew 81 mg by mouth daily.    . carvedilol (COREG) 3.125 MG tablet Take 3.125 mg by mouth 2 (two) times daily with a meal.    . cholecalciferol (VITAMIN D) 1000 units tablet Take 1,000 Units by mouth daily.    . Fluocinolone Acetonide 0.01 % OIL Place 1 application in ear(s) daily.    Marland Kitchen. latanoprost (XALATAN) 0.005 % ophthalmic solution Place 1 drop into both eyes at bedtime.  5  . lisinopril (PRINIVIL,ZESTRIL) 20 MG tablet Take 40 mg by mouth daily.    . potassium chloride 20 MEQ/15ML (10%) SOLN Take 60 mEq by mouth daily.    Marland Kitchen. LORazepam (ATIVAN) 1 MG tablet Take 1 tablet (1 mg total) by mouth every 8 (eight) hours as needed (agitation). (Patient not taking:  Reported on 12/04/2016) 20 tablet 0     Discharge Medications: Please see discharge summary for a list of discharge medications.  Relevant Imaging Results:  Relevant Lab Results:   Additional Information SSN: [id number]  Cheron SchaumannBandi, Tiron Suski M, [place]

## 2016-12-13 NOTE — ED Notes (Signed)
BEHAVIORAL HEALTH ROUNDING  Patient sleeping: No.  Patient alert and oriented: yes  Behavior appropriate: Yes. ; If no, describe:  Nutrition and fluids offered: Yes  Toileting and hygiene offered: Yes  Sitter present: not applicable, Q 15 min safety rounds and observation.  Law enforcement present: Yes ODS  

## 2016-12-13 NOTE — ED Notes (Signed)
BEHAVIORAL HEALTH ROUNDING Patient sleeping: Yes.   Patient alert and oriented: not applicable SLEEPING Behavior appropriate: Yes.  ; If no, describe: SLEEPING Nutrition and fluids offered: No SLEEPING Toileting and hygiene offered: NoSLEEPING Sitter present: not applicable, Q 15 min safety rounds and observation. Law enforcement present: Yes ODS 

## 2016-12-13 NOTE — ED Provider Notes (Signed)
-----------------------------------------   7:04 AM on 12/13/2016 -----------------------------------------   Blood pressure (!) 147/76, pulse 79, temperature 97.8 F (36.6 C), temperature source Oral, resp. rate 16, weight 130 lb (59 kg), SpO2 98 %.  The patient had no acute events since last update.  Calm and cooperative at this time.  Disposition is pending Psychiatry/Behavioral Medicine team recommendations.     Irean HongJade J Khalessi Blough, MD 12/13/16 [phone]

## 2016-12-13 NOTE — ED Notes (Signed)
Talked to Barnes & NobleClaudine, Child psychotherapistsocial worker. Permission to contact daughter. Phone number- [phone].

## 2016-12-13 NOTE — ED Notes (Signed)
ENVIRONMENTAL ASSESSMENT  Potentially harmful objects out of patient reach: Yes.  Personal belongings secured: Yes.  Patient dressed in hospital provided attire only: Yes.  Plastic bags out of patient reach: Yes.  Patient care equipment (cords, cables, call bells, lines, and drains) shortened, removed, or accounted for: Yes.  Equipment and supplies removed from bottom of stretcher: Yes.  Potentially toxic materials out of patient reach: Yes.  Sharps container removed or out of patient reach: Yes  BEHAVIORAL HEALTH ROUNDING  Patient sleeping: No.  Patient alert and oriented: yes  Behavior appropriate: Yes. ; If no, describe:  Nutrition and fluids offered: Yes  Toileting and hygiene offered: Yes  Sitter present: not applicable, Q 15 min safety rounds and observation.  Law enforcement present: Yes ODS  ED BHU PLACEMENT JUSTIFICATION  Is the patient under IVC or is there intent for IVC: No  Is the patient medically cleared: Yes.  Is there vacancy in the ED BHU: Yes.  Is the population mix appropriate for patient: No.  Is the patient awaiting placement in inpatient or outpatient setting: Yes.  Has the patient had a psychiatric consult: Yes.  Survey of unit performed for contraband, proper placement and condition of furniture, tampering with fixtures in bathroom, shower, and each patient room: Yes. ; Findings: All clear  APPEARANCE/BEHAVIOR  calm, cooperative and adequate rapport can be established  NEURO ASSESSMENT  Orientation:  place and person per her baseline Hallucinations: No.None noted (Hallucinations)  Speech: Normal  Gait: normal  RESPIRATORY ASSESSMENT  WNL  CARDIOVASCULAR ASSESSMENT  WNL  GASTROINTESTINAL ASSESSMENT  WNL  EXTREMITIES  WNL  PLAN OF CARE  Provide calm/safe environment. Vital signs assessed TID. ED BHU Assessment once each 12-hour shift. Collaborate with TTS daily or as condition indicates. Assure the ED provider has rounded once each shift. Provide and  encourage hygiene. Provide redirection as needed. Assess for escalating behavior; address immediately and inform ED provider.  Assess family dynamic and appropriateness for visitation as needed: Yes. ; If necessary, describe findings:  Educate the patient/family about BHU procedures/visitation: Yes. ; If necessary, describe findings: Pt is calm and cooperative at this time. Pt understanding and accepting of unit procedures/rules. Will continue to monitor with Q 15 min safety rounds and observation.    .Marland Kitchen

## 2016-12-13 NOTE — ED Notes (Signed)
Pt  Vol  Pending  placement 

## 2016-12-13 NOTE — Progress Notes (Signed)
LCSW called Marlene LardRhonda Lambert [phone] Financial counselor called and left a message.  Called Sunny SchleinFelicia ED RN to inquire if TB test/ She is going to speak to Dr Demetrius CharityP to see if this can expedited and he can have radiologist confirm on chest x-ray and write that no TB present. She will work on this and advise LCSW outcomes. Other blood work will take up to 3 days for results. DR P willing to write something up based on ex-ray.  [facility] - Gave her heads up over finances and she will speak to [facility] Director and call me back, LCSW disclosed combined income.  Spoke to [place] APS- Pt has Income for 1395.00 ssdi and 205.00 pension from late husband. ( 1600.00)   PATIENT DOES NOT QUALIFY FOR MEDICAID OR MEDICAID SPECIAL ASSISTANCE   LCSW inquired if daughter is allowed to have contact with patient and could  visit and we were informed this OK by APS.   Delta Air LinesClaudine Hubert Derstine LCSW [phone]

## 2016-12-13 NOTE — Progress Notes (Addendum)
LCSW and APS worker Ms Lowell Guitarowell- weekend number [phone] consulted and reviewed SNF that we were in contact with today.  LCSW has resent information out to facilities or called results are as follows.  [facility]- Under review [facility] -information sent awaiting a call back [facility] Fl[facility] and awaiting a consult. [facility]ind[facility]  [facility] in [place]/ Left message and will call back, Other family care homes that work with APS on a short interim process were contacted and APS worker Cory MunchJanay Powell will advise CSW as she hears back.  [place] and LCSW called patients sister Liz MaladyLashita Hall [phone]. She may consider taking patient home until APS can find housing. Her grandsons also expressed interest in providing care for their grandmother. The petition is now before the Magistrate as Ethelene Brownsnthony is requesting guardian ship of his grandmother. LCSW will contact them tomorrow.  Haig Prophetnthony Williamson [phone]  Felicie MornMarcus Williamson   [phone]  Arrie Senatelaudine Reannon Candella LCSW [phone]

## 2016-12-13 NOTE — ED Notes (Signed)
Pt given meal tray and juice. 

## 2016-12-13 NOTE — Progress Notes (Signed)
LCSW was informed that [facility] not [facility] as she will require memory care and that unit costs 3600.00 month.  LCSW will contact APS and see if they have  Other placement alternatives. Left message awaiting call back  LCSW will review other [facility] to see about prices.  EDRN notified placement issue  Hargun Spurling LCSW [phone]

## 2016-12-13 NOTE — ED Notes (Signed)
Pt ate 25% of food.

## 2016-12-13 NOTE — ED Notes (Signed)
Pt assisted to bathroom to clean up and change brief.

## 2016-12-14 LAB — BASIC METABOLIC PANEL
Anion gap: 4 — ABNORMAL LOW (ref 5–15)
BUN: 12 mg/dL (ref 6–20)
CALCIUM: 11.2 mg/dL — AB (ref 8.9–10.3)
CHLORIDE: 110 mmol/L (ref 101–111)
CO2: 30 mmol/L (ref 22–32)
Creatinine, Ser: 0.77 mg/dL (ref 0.44–1.00)
GFR calc non Af Amer: >60 mL/min (ref >60)
GLUCOSE: 84 mg/dL (ref 65–99)
Potassium: 3.9 mmol/L (ref 3.5–5.1)
Sodium: 144 mmol/L (ref 135–145)

## 2016-12-14 MED ORDER — CIPROFLOXACIN HCL 500 MG PO TABS: 500.0000 mg | ORAL_TABLET | Freq: Two times a day (BID) | ORAL | 0 refills | Status: DC

## 2016-12-14 NOTE — ED Notes (Signed)
Discharge instructions and extensive information about community resources given to grandson Yesenia Leonard who verbalizes understanding.

## 2016-12-14 NOTE — ED Notes (Signed)
Pt visualized in NAD at this time. Pt laying back in bed at this time. Pt requesting to speak with her daughter, this RN dialed phone for her. No change in patient condition.

## 2016-12-14 NOTE — Progress Notes (Signed)
LCSW called other Larey BrickGrandson Anthony [phone]. LCSW explained at length the patient has been discharged and needs to be taken care of by the family ( 24 hour supervision) Lucila MaineGrandson is in agreeable and was explained the cost of private pay ALF vs hotel and care providers. He is agreeable that his grandmother should be cared for by family. It was explained because patient is able to walk with minimal assistance, she can qualify for out patient PT. It was explained that patient will need to be transported by family to and from PT and doctors appointments to ensure she is thriving.  LCSW/ED Care manager was consulted and agreed to put together several resources for the patient's family to access to meet the patients direct needs.   APS Cory MunchJanay Powell was called at home cell number [phone] APS worker was called and informed that the grandsons will be picking up patient today and providing 24 -7 care for their grandmother  [place]  LCSW agreed to fax over  patients family contact information to APS worker with their consent so she can follow the patients and family progress.  Texted: Earl LagosZack Brook  Assistant Clinical Director of Forensic scientistocial workwith plan. LCSW will leave resource bundle with ED RN so once family members pick up patient resources will be provided.  Delta Air LinesClaudine Shamara Soza LCSW [phone]

## 2016-12-14 NOTE — ED Notes (Signed)
BEHAVIORAL HEALTH ROUNDING Patient sleeping: Yes.   Patient alert and oriented: not applicable SLEEPING Behavior appropriate: Yes.  ; If no, describe: SLEEPING Nutrition and fluids offered: No SLEEPING Toileting and hygiene offered: NoSLEEPING Sitter present: not applicable, Q 15 min safety rounds and observation. Law enforcement present: Yes ODS 

## 2016-12-14 NOTE — ED Notes (Signed)
NAD noted at this time. Pt resting in bed, sitting on the side. Pt denies any needs at this time, pt denies wanting TV on. No change in patient condition, noted to be calm and cooperative at this time.

## 2016-12-14 NOTE — ED Notes (Signed)
BEHAVIORAL HEALTH ROUNDING  Patient sleeping: No.  Patient alert and oriented: yes  Behavior appropriate: Yes. ; If no, describe:  Nutrition and fluids offered: Yes  Toileting and hygiene offered: Yes  Sitter present: not applicable, Q 15 min safety rounds and observation.  Law enforcement present: Yes ODS  

## 2016-12-14 NOTE — ED Notes (Signed)
Pt visualized in NAD at this time. Pt noted to be sitting on side of bed with feet on floor while wrapped in a blanket. Pt requesting to speak with her daughter at this time. Pt is alert and at baseline, respirations even and unlabored at this time.

## 2016-12-14 NOTE — ED Provider Notes (Signed)
Vitals:   12/14/16 1515 12/14/16 1739  BP: 131/85 (!) 149/76  Pulse: 73 65  Resp: 18 18  Temp: 98.2 F (36.8 C)    Patient with a protracted ER course, family is going to take her to a hotel. At this point she is medically stable for discharge. She does have a UTI and she will be treated for same.   Emily FilbertJonathan E Delesa Kawa, MD 12/14/16 2036

## 2016-12-14 NOTE — ED Notes (Signed)
Pt given lunch tray at this time

## 2016-12-14 NOTE — ED Notes (Signed)
Report received from Ann, RN.

## 2016-12-14 NOTE — ED Notes (Signed)
ENVIRONMENTAL ASSESSMENT  Potentially harmful objects out of patient reach: Yes.  Personal belongings secured: Yes.  Patient dressed in hospital provided attire only: Yes.  Plastic bags out of patient reach: Yes.  Patient care equipment (cords, cables, call bells, lines, and drains) shortened, removed, or accounted for: Yes.  Equipment and supplies removed from bottom of stretcher: Yes.  Potentially toxic materials out of patient reach: Yes.  Sharps container removed or out of patient reach: Yes.   BEHAVIORAL HEALTH ROUNDING  Patient sleeping: No.  Patient alert and oriented: yes  Behavior appropriate: Yes. ; If no, describe:  Nutrition and fluids offered: Yes  Toileting and hygiene offered: Yes  Sitter present: not applicable, Q 15 min safety rounds and observation.  Law enforcement present: Yes ODS  ED BHU PLACEMENT JUSTIFICATION  Is the patient under IVC or is there intent for IVC: No.  Is the patient medically cleared: Yes.  Is there vacancy in the ED BHU: Yes.  Is the population mix appropriate for patient: No.  Is the patient awaiting placement in inpatient or outpatient setting: Awaiting discharge to family per report today.  Has the patient had a psychiatric consult: Yes.  Survey of unit performed for contraband, proper placement and condition of furniture, tampering with fixtures in bathroom, shower, and each patient room: Yes. ; Findings: All clear  APPEARANCE/BEHAVIOR  calm, cooperative and adequate rapport can be established  NEURO ASSESSMENT  Orientation:  place and person per her baseline Hallucinations: No.None noted (Hallucinations)  Speech: Normal  Gait: normal  RESPIRATORY ASSESSMENT  WNL  CARDIOVASCULAR ASSESSMENT  WNL  GASTROINTESTINAL ASSESSMENT  WNL  EXTREMITIES  WNL  PLAN OF CARE  Provide calm/safe environment. Vital signs assessed TID. ED BHU Assessment once each 12-hour shift. Collaborate with TTS daily or as condition indicates. Assure the ED  provider has rounded once each shift. Provide and encourage hygiene. Provide redirection as needed. Assess for escalating behavior; address immediately and inform ED provider.  Assess family dynamic and appropriateness for visitation as needed: Yes. ; If necessary, describe findings:  Educate the patient/family about BHU procedures/visitation: Yes. ; If necessary, describe findings: Pt is calm and cooperative at this time. Pt understanding and accepting of unit procedures/rules. Will continue to monitor with Q 15 min safety rounds and observation.
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
## 2016-12-14 NOTE — Progress Notes (Signed)
Patient ID: Yesenia Leonard, female   DOB: 06/25/1924, 83 y.o.   MRN: [id number]  Yesenia Senatelaudine Bandi, LCSW consulted with Yesenia Leonard Assistant Clinical Director of Social Work. He is in agreeance with patient staying in a hotel with supervision from family and home health assistance until APS can work with the family to find placement for patient.  CSW contacted Yesenia Leonard (grandson [phone]). Grandson stating that he will be making arrangements to pick up patient this afternoon. Grandson informed CSW that he spoke with his mother who states she would be willing to stay with patient some nights in the hotel and assist with supervising patient throughout the day. Yesenia MaineGrandson stated he would call CSW back with the time he could pick up patient. He was unsure of the time due to his travel distance from the hospital. Yesenia MaineGrandson lives in [place].   Yesenia Leonard MSW, LCSWA 12/14/2016 1:48 PM

## 2016-12-14 NOTE — ED Provider Notes (Signed)
-----------------------------------------   10:21 AM on 12/14/2016 -----------------------------------------   Blood pressure (!) 162/83, pulse 83, temperature 98 F (36.7 C), temperature source Oral, resp. rate 18, weight 130 lb (59 kg), SpO2 96 %.  The patient had no acute events since last update.  Calm and cooperative at this time.  Disposition is pending Psychiatry/Behavioral Medicine team recommendations.     Willy EddyPatrick Jarone Ostergaard, MD 12/14/16 1021

## 2016-12-14 NOTE — ED Notes (Signed)
Report given to Ann, RN.

## 2016-12-14 NOTE — Progress Notes (Signed)
LCSW met with patient who was oriented to person, place and time this afternoon. She was informed that she is to be discharged with her grandson Beverely Low who is arranging accomodation and will have other family members take care of her. Patient is agreeable with plan.  ED RN and LCSW consulted and explained the family situation/provided yellow envelope with PT resources/[employer] resources specific to [facility].Enis Slipper LCSW [phone]

## 2016-12-14 NOTE — ED Notes (Signed)
Pt visualized in NAD at this time. Pt resting in bed with eyes closed, respirations even and unlabored. Will continue to monitor for further patient needs.

## 2016-12-14 NOTE — ED Notes (Signed)
Pt sat up in bed and given a breakfast tray at this time. This RN opened apple juice for patient at this time.

## 2016-12-14 NOTE — ED Notes (Signed)
Pt sitting up on side of bed and given a meal tray. NAD noted at this time. Will continue to monitor for further patient needs.

## 2016-12-14 NOTE — Progress Notes (Signed)
Patient ID: Izora GalaDoris O Ryle, female   DOB: [birth year], 83 y.o.   MRN: [id number]  CSW contacted Lyndel SafeLachita Hall (sister [phone]) and informed her that patient was cleared for discharge. Sister stated that she would not be able to pick patient up and is refusing to assist with the care of patient. Sister stated she is unable to care for patient because she cannot handle the care of patient due to her having to care for husband.  CSW contacted Felicie MornMarcus Williamson (grandson [phone]) and informed him that patient was cleared for discharge. Grandson stated he has limited space in his home and patient would not have room in his home. Grandson asked if he could put her [place] and keep an eye on her until APS is able to find placement. Berna SpareMarcus stated he would contact his brother and try to figure out a plan for patient until APS can find placement. Berna SpareMarcus also stated that patient's daughter would not be able to assist with the care of patient due to her mental health limitations.   CSW attempted to contact Haig Prophetnthony Williamson (grandson [phone]). No answer, CSW left voicemail providing contact information to call CSW back.   CSW Claudine GuilfordBandi, [place], consulted Zack Industrial/product designerBrooks Assistant Clinical Director of Social Work on how to proceed with patient.  Martiza Speth G. Garnette CzechSampson MSW, LCSWA 12/14/2016 11:51 AM

## 2016-12-14 NOTE — ED Notes (Signed)
Pt to restroom with assistance from this tech

## 2016-12-14 NOTE — ED Notes (Signed)
Pt given dinner tray at this time.  
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
## 2016-12-16 ENCOUNTER — Telehealth: Payer: Self-pay | Admitting: Emergency Medicine

## 2016-12-16 LAB — QUANTIFERON IN TUBE
QFT TB AG MINUS NIL VALUE: 6.27 [IU]/mL
QUANTIFERON MITOGEN VALUE: 6.33 IU/mL
QUANTIFERON TB AG VALUE: 6.32 IU/mL
QUANTIFERON TB GOLD: POSITIVE — AB
Quantiferon Nil Value: 0.05 IU/mL

## 2016-12-16 LAB — QUANTIFERON TB GOLD ASSAY (BLOOD)

## 2016-12-16 NOTE — Telephone Encounter (Signed)
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  I called [facility] and they also feel it is latent. I will send them a referral so they can offer medication, but say they would not mandate it in this case.  Spoke with lula and explained that they should follow up with [facility] and decide whether patient should take meds.  I also told her that this could be a barrier to placement, and advised her to notify the Child psychotherapistsocial worker.  Also advised to notify her pcp at [facility].  She agrees.

## 2016-12-19 ENCOUNTER — Emergency Department: Payer: Medicare Other

## 2016-12-19 ENCOUNTER — Inpatient Hospital Stay: Payer: Medicare Other | Admitting: Anesthesiology

## 2016-12-19 ENCOUNTER — Encounter
Admission: EM | Disposition: A | Discharge status: Skilled Nursing Facility | Payer: Self-pay | Source: Home / Self Care | Attending: Internal Medicine

## 2016-12-19 ENCOUNTER — Encounter: Payer: Self-pay | Admitting: Emergency Medicine

## 2016-12-19 ENCOUNTER — Inpatient Hospital Stay
Admission: EM | Admit: 2016-12-19 | Discharge: 2016-12-23 | DRG: 480 | Disposition: A | Discharge status: Skilled Nursing Facility | Payer: Medicare Other | Attending: Internal Medicine | Admitting: Internal Medicine

## 2016-12-19 ENCOUNTER — Inpatient Hospital Stay: Payer: Medicare Other

## 2016-12-19 DIAGNOSIS — Z9289 Personal history of other medical treatment: Secondary | ICD-10-CM

## 2016-12-19 DIAGNOSIS — J101 Influenza due to other identified influenza virus with other respiratory manifestations: Secondary | ICD-10-CM | POA: Diagnosis present

## 2016-12-19 DIAGNOSIS — I951 Orthostatic hypotension: Secondary | ICD-10-CM | POA: Diagnosis not present

## 2016-12-19 DIAGNOSIS — R262 Difficulty in walking, not elsewhere classified: Secondary | ICD-10-CM

## 2016-12-19 DIAGNOSIS — Z6821 Body mass index (BMI) 21.0-21.9, adult: Secondary | ICD-10-CM

## 2016-12-19 DIAGNOSIS — D696 Thrombocytopenia, unspecified: Secondary | ICD-10-CM

## 2016-12-19 DIAGNOSIS — R42 Dizziness and giddiness: Secondary | ICD-10-CM

## 2016-12-19 DIAGNOSIS — S72141A Displaced intertrochanteric fracture of right femur, initial encounter for closed fracture: Secondary | ICD-10-CM | POA: Diagnosis present

## 2016-12-19 DIAGNOSIS — W010XXA Fall on same level from slipping, tripping and stumbling without subsequent striking against object, initial encounter: Secondary | ICD-10-CM | POA: Diagnosis present

## 2016-12-19 DIAGNOSIS — S72001A Fracture of unspecified part of neck of right femur, initial encounter for closed fracture: Secondary | ICD-10-CM

## 2016-12-19 DIAGNOSIS — E7800 Pure hypercholesterolemia, unspecified: Secondary | ICD-10-CM | POA: Diagnosis present

## 2016-12-19 DIAGNOSIS — M810 Age-related osteoporosis without current pathological fracture: Secondary | ICD-10-CM | POA: Diagnosis present

## 2016-12-19 DIAGNOSIS — I10 Essential (primary) hypertension: Secondary | ICD-10-CM | POA: Diagnosis present

## 2016-12-19 DIAGNOSIS — E43 Unspecified severe protein-calorie malnutrition: Secondary | ICD-10-CM | POA: Diagnosis present

## 2016-12-19 DIAGNOSIS — I959 Hypotension, unspecified: Secondary | ICD-10-CM

## 2016-12-19 DIAGNOSIS — H409 Unspecified glaucoma: Secondary | ICD-10-CM | POA: Diagnosis present

## 2016-12-19 DIAGNOSIS — R7303 Prediabetes: Secondary | ICD-10-CM | POA: Diagnosis present

## 2016-12-19 DIAGNOSIS — F419 Anxiety disorder, unspecified: Secondary | ICD-10-CM | POA: Diagnosis present

## 2016-12-19 DIAGNOSIS — M25551 Pain in right hip: Secondary | ICD-10-CM | POA: Diagnosis present

## 2016-12-19 DIAGNOSIS — S72009A Fracture of unspecified part of neck of unspecified femur, initial encounter for closed fracture: Secondary | ICD-10-CM | POA: Diagnosis present

## 2016-12-19 DIAGNOSIS — Y92009 Unspecified place in unspecified non-institutional (private) residence as the place of occurrence of the external cause: Secondary | ICD-10-CM | POA: Diagnosis not present

## 2016-12-19 DIAGNOSIS — F0390 Unspecified dementia without behavioral disturbance: Secondary | ICD-10-CM | POA: Diagnosis present

## 2016-12-19 DIAGNOSIS — Z9889 Other specified postprocedural states: Secondary | ICD-10-CM

## 2016-12-19 DIAGNOSIS — Z419 Encounter for procedure for purposes other than remedying health state, unspecified: Secondary | ICD-10-CM

## 2016-12-19 DIAGNOSIS — W19XXXA Unspecified fall, initial encounter: Secondary | ICD-10-CM

## 2016-12-19 DIAGNOSIS — Z8781 Personal history of (healed) traumatic fracture: Secondary | ICD-10-CM

## 2016-12-19 DIAGNOSIS — D62 Acute posthemorrhagic anemia: Secondary | ICD-10-CM

## 2016-12-19 HISTORY — DX: Nonspecific reaction to tuberculin skin test without active tuberculosis: R76.11

## 2016-12-19 HISTORY — PX: INTRAMEDULLARY (IM) NAIL INTERTROCHANTERIC: SHX5875

## 2016-12-19 LAB — URINALYSIS, COMPLETE (UACMP) WITH MICROSCOPIC
BILIRUBIN URINE: NEGATIVE
Bacteria, UA: NONE SEEN
GLUCOSE, UA: NEGATIVE mg/dL
Ketones, ur: NEGATIVE mg/dL
LEUKOCYTES UA: NEGATIVE
NITRITE: NEGATIVE
PH: 6 (ref 5.0–8.0)
Protein, ur: 30 mg/dL — AB
SPECIFIC GRAVITY, URINE: 1.016 (ref 1.005–1.030)

## 2016-12-19 LAB — COMPREHENSIVE METABOLIC PANEL
ALT: 23 U/L (ref 14–54)
ANION GAP: 7 (ref 5–15)
AST: 40 U/L (ref 15–41)
Albumin: 3.4 g/dL — ABNORMAL LOW (ref 3.5–5.0)
Alkaline Phosphatase: 50 U/L (ref 38–126)
BUN: 18 mg/dL (ref 6–20)
CALCIUM: 10.2 mg/dL (ref 8.9–10.3)
CHLORIDE: 102 mmol/L (ref 101–111)
CO2: 29 mmol/L (ref 22–32)
Creatinine, Ser: 0.81 mg/dL (ref 0.44–1.00)
Glucose, Bld: 102 mg/dL — ABNORMAL HIGH (ref 65–99)
Potassium: 3.5 mmol/L (ref 3.5–5.1)
Sodium: 138 mmol/L (ref 135–145)
Total Bilirubin: 0.9 mg/dL (ref 0.3–1.2)
Total Protein: 6.9 g/dL (ref 6.5–8.1)

## 2016-12-19 LAB — LACTIC ACID, PLASMA: Lactic Acid, Venous: 1.9 mmol/L (ref 0.5–1.9)

## 2016-12-19 LAB — CBC WITH DIFFERENTIAL/PLATELET
Basophils Absolute: 0 10*3/uL (ref 0–0.1)
Basophils Relative: 0 %
EOS PCT: 1 %
Eosinophils Absolute: 0.1 10*3/uL (ref 0–0.7)
HCT: 39.7 % (ref 35.0–47.0)
Hemoglobin: 13.4 g/dL (ref 12.0–16.0)
LYMPHS ABS: 1.4 10*3/uL (ref 1.0–3.6)
LYMPHS PCT: 17 %
MCH: 29.6 pg (ref 26.0–34.0)
MCHC: 33.7 g/dL (ref 32.0–36.0)
MCV: 87.9 fL (ref 80.0–100.0)
MONO ABS: 0.8 10*3/uL (ref 0.2–0.9)
MONOS PCT: 10 %
Neutro Abs: 5.8 10*3/uL (ref 1.4–6.5)
Neutrophils Relative %: 72 %
PLATELETS: 124 10*3/uL — AB (ref 150–440)
RBC: 4.52 MIL/uL (ref 3.80–5.20)
RDW: 14.5 % (ref 11.5–14.5)
WBC: 8 10*3/uL (ref 3.6–11.0)

## 2016-12-19 LAB — TROPONIN I: Troponin I: 0.04 ng/mL (ref <0.03)

## 2016-12-19 LAB — INFLUENZA PANEL BY PCR (TYPE A & B)
Influenza A By PCR: POSITIVE — AB
Influenza B By PCR: NEGATIVE

## 2016-12-19 LAB — PROTIME-INR
INR: 1.14
PROTHROMBIN TIME: 14.7 s (ref 11.4–15.2)

## 2016-12-19 SURGERY — FIXATION, FRACTURE, INTERTROCHANTERIC, WITH INTRAMEDULLARY ROD
Anesthesia: General | Site: Hip | Laterality: Right | Wound class: Clean

## 2016-12-19 MED ORDER — FENTANYL CITRATE (PF) 100 MCG/2ML IJ SOLN
INTRAMUSCULAR | Status: DC | PRN
  Administered 2016-12-19: 50 ug via INTRAVENOUS
  Administered 2016-12-19: 25 ug via INTRAVENOUS

## 2016-12-19 MED ORDER — FENTANYL CITRATE (PF) 100 MCG/2ML IJ SOLN
INTRAMUSCULAR | Status: AC
  Filled 2016-12-19: qty 2

## 2016-12-19 MED ORDER — PROPOFOL 10 MG/ML IV BOLUS
INTRAVENOUS | Status: DC | PRN
  Administered 2016-12-19: 80 mg via INTRAVENOUS

## 2016-12-19 MED ORDER — PHENYLEPHRINE HCL 10 MG/ML IJ SOLN
INTRAMUSCULAR | Status: DC | PRN
  Administered 2016-12-19 (×4): 100 ug via INTRAVENOUS

## 2016-12-19 MED ORDER — DEXAMETHASONE SODIUM PHOSPHATE 10 MG/ML IJ SOLN
INTRAMUSCULAR | Status: DC | PRN
  Administered 2016-12-19: 5 mg via INTRAVENOUS

## 2016-12-19 MED ORDER — AMLODIPINE BESYLATE 10 MG PO TABS
10.0000 mg | ORAL_TABLET | Freq: Every day | ORAL | Status: DC
  Filled 2016-12-19: qty 1

## 2016-12-19 MED ORDER — ACETAMINOPHEN 650 MG RE SUPP: 650.0000 mg | Freq: Four times a day (QID) | RECTAL | Status: DC | PRN

## 2016-12-19 MED ORDER — LIDOCAINE HCL (CARDIAC) 20 MG/ML IV SOLN
INTRAVENOUS | Status: DC | PRN
  Administered 2016-12-19: 60 mg via INTRAVENOUS

## 2016-12-19 MED ORDER — MORPHINE SULFATE (PF) 2 MG/ML IV SOLN: 2.0000 mg | INTRAVENOUS | Status: DC | PRN

## 2016-12-19 MED ORDER — CARVEDILOL 3.125 MG PO TABS: 3.1250 mg | ORAL_TABLET | Freq: Two times a day (BID) | ORAL | Status: DC

## 2016-12-19 MED ORDER — LISINOPRIL 20 MG PO TABS
40.0000 mg | ORAL_TABLET | Freq: Every day | ORAL | Status: DC
  Filled 2016-12-19: qty 2

## 2016-12-19 MED ORDER — CEFAZOLIN SODIUM-DEXTROSE 2-3 GM-% IV SOLR
INTRAVENOUS | Status: DC | PRN
  Administered 2016-12-19: 2 g via INTRAVENOUS

## 2016-12-19 MED ORDER — ACETAMINOPHEN 650 MG RE SUPP
650.0000 mg | Freq: Four times a day (QID) | RECTAL | Status: DC | PRN
Start: 2016-12-19 — End: 2016-12-23

## 2016-12-19 MED ORDER — SUCCINYLCHOLINE CHLORIDE 200 MG/10ML IV SOSY
PREFILLED_SYRINGE | INTRAVENOUS | Status: AC
  Filled 2016-12-19: qty 10

## 2016-12-19 MED ORDER — BUPIVACAINE-EPINEPHRINE (PF) 0.5% -1:200000 IJ SOLN
INTRAMUSCULAR | Status: DC | PRN
  Administered 2016-12-19: 20 mL via PERINEURAL

## 2016-12-19 MED ORDER — DEXAMETHASONE SODIUM PHOSPHATE 10 MG/ML IJ SOLN
INTRAMUSCULAR | Status: AC
  Filled 2016-12-19: qty 1

## 2016-12-19 MED ORDER — CEFAZOLIN SODIUM-DEXTROSE 2-3 GM-% IV SOLR
2.0000 g | Freq: Four times a day (QID) | INTRAVENOUS | Status: AC
  Administered 2016-12-19 – 2016-12-20 (×3): 2 g via INTRAVENOUS
  Filled 2016-12-19 (×3): qty 50

## 2016-12-19 MED ORDER — MORPHINE SULFATE (PF) 2 MG/ML IV SOLN: 1.0000 mg | INTRAVENOUS | Status: DC | PRN

## 2016-12-19 MED ORDER — ONDANSETRON HCL 4 MG/2ML IJ SOLN: 4.0000 mg | Freq: Once | INTRAMUSCULAR | Status: DC | PRN

## 2016-12-19 MED ORDER — MAGNESIUM HYDROXIDE 400 MG/5ML PO SUSP: 30.0000 mL | Freq: Every day | ORAL | Status: DC | PRN

## 2016-12-19 MED ORDER — FENTANYL CITRATE (PF) 100 MCG/2ML IJ SOLN
25.0000 ug | Freq: Once | INTRAMUSCULAR | Status: AC
  Administered 2016-12-19: 25 ug via INTRAVENOUS
  Filled 2016-12-19: qty 2

## 2016-12-19 MED ORDER — SODIUM CHLORIDE 0.9 % IV BOLUS (SEPSIS)
1000.0000 mL | Freq: Once | INTRAVENOUS | Status: AC
  Administered 2016-12-19: 1000 mL via INTRAVENOUS

## 2016-12-19 MED ORDER — CEFAZOLIN SODIUM-DEXTROSE 2-4 GM/100ML-% IV SOLN
2.0000 g | Freq: Once | INTRAVENOUS | Status: DC
  Filled 2016-12-19: qty 100

## 2016-12-19 MED ORDER — ACETAMINOPHEN 10 MG/ML IV SOLN
INTRAVENOUS | Status: AC
Start: 2016-12-19 — End: 2016-12-19
  Filled 2016-12-19: qty 100

## 2016-12-19 MED ORDER — ACETAMINOPHEN 10 MG/ML IV SOLN
INTRAVENOUS | Status: DC | PRN
  Administered 2016-12-19: 1000 mg via INTRAVENOUS

## 2016-12-19 MED ORDER — ENOXAPARIN SODIUM 40 MG/0.4ML ~~LOC~~ SOLN
40.0000 mg | SUBCUTANEOUS | Status: DC
  Administered 2016-12-20 – 2016-12-23 (×4): 40 mg via SUBCUTANEOUS
  Filled 2016-12-19 (×4): qty 0.4

## 2016-12-19 MED ORDER — ONDANSETRON HCL 4 MG PO TABS: 4.0000 mg | ORAL_TABLET | Freq: Four times a day (QID) | ORAL | Status: DC | PRN

## 2016-12-19 MED ORDER — SUCCINYLCHOLINE CHLORIDE 20 MG/ML IJ SOLN
INTRAMUSCULAR | Status: DC | PRN
  Administered 2016-12-19: 100 mg via INTRAVENOUS

## 2016-12-19 MED ORDER — ONDANSETRON HCL 4 MG/2ML IJ SOLN: 4.0000 mg | Freq: Four times a day (QID) | INTRAMUSCULAR | Status: DC | PRN

## 2016-12-19 MED ORDER — DOCUSATE SODIUM 100 MG PO CAPS
100.0000 mg | ORAL_CAPSULE | Freq: Two times a day (BID) | ORAL | Status: DC
  Administered 2016-12-19 – 2016-12-23 (×7): 100 mg via ORAL
  Filled 2016-12-19 (×7): qty 1

## 2016-12-19 MED ORDER — SODIUM CHLORIDE 0.9 % IV SOLN: INTRAVENOUS | Status: DC

## 2016-12-19 MED ORDER — FLEET ENEMA 7-19 GM/118ML RE ENEM: 1.0000 | ENEMA | Freq: Once | RECTAL | Status: DC | PRN

## 2016-12-19 MED ORDER — ROCURONIUM BROMIDE 100 MG/10ML IV SOLN
INTRAVENOUS | Status: DC | PRN
  Administered 2016-12-19 (×2): 10 mg via INTRAVENOUS

## 2016-12-19 MED ORDER — SUGAMMADEX SODIUM 200 MG/2ML IV SOLN
INTRAVENOUS | Status: DC | PRN
  Administered 2016-12-19: 120 mg via INTRAVENOUS

## 2016-12-19 MED ORDER — ACETAMINOPHEN 500 MG PO TABS
1000.0000 mg | ORAL_TABLET | Freq: Four times a day (QID) | ORAL | Status: AC
  Administered 2016-12-19 – 2016-12-20 (×3): 1000 mg via ORAL
  Filled 2016-12-19 (×3): qty 2

## 2016-12-19 MED ORDER — OSELTAMIVIR PHOSPHATE 30 MG PO CAPS
30.0000 mg | ORAL_CAPSULE | Freq: Two times a day (BID) | ORAL | Status: DC
  Administered 2016-12-19 – 2016-12-23 (×8): 30 mg via ORAL
  Filled 2016-12-19 (×8): qty 1

## 2016-12-19 MED ORDER — FENTANYL CITRATE (PF) 100 MCG/2ML IJ SOLN: 25.0000 ug | INTRAMUSCULAR | Status: DC | PRN

## 2016-12-19 MED ORDER — OXYCODONE HCL 5 MG PO TABS
5.0000 mg | ORAL_TABLET | ORAL | Status: DC | PRN
  Administered 2016-12-19: 5 mg via ORAL
  Filled 2016-12-19: qty 1

## 2016-12-19 MED ORDER — SUGAMMADEX SODIUM 200 MG/2ML IV SOLN
INTRAVENOUS | Status: AC
  Filled 2016-12-19: qty 2

## 2016-12-19 MED ORDER — ACETAMINOPHEN 325 MG PO TABS: 650.0000 mg | ORAL_TABLET | Freq: Four times a day (QID) | ORAL | Status: DC | PRN

## 2016-12-19 MED ORDER — DIPHENHYDRAMINE HCL 12.5 MG/5ML PO ELIX: 12.5000 mg | ORAL_SOLUTION | ORAL | Status: DC | PRN

## 2016-12-19 MED ORDER — LEVOFLOXACIN 500 MG PO TABS
500.0000 mg | ORAL_TABLET | Freq: Once | ORAL | Status: AC
  Administered 2016-12-19: 500 mg via ORAL
  Filled 2016-12-19: qty 1

## 2016-12-19 MED ORDER — PANTOPRAZOLE SODIUM 40 MG PO TBEC
40.0000 mg | DELAYED_RELEASE_TABLET | Freq: Every day | ORAL | Status: DC
  Administered 2016-12-19 – 2016-12-23 (×5): 40 mg via ORAL
  Filled 2016-12-19 (×5): qty 1

## 2016-12-19 MED ORDER — ONDANSETRON HCL 4 MG/2ML IJ SOLN
INTRAMUSCULAR | Status: DC | PRN
  Administered 2016-12-19: 4 mg via INTRAVENOUS

## 2016-12-19 MED ORDER — METOCLOPRAMIDE HCL 5 MG/ML IJ SOLN: 5.0000 mg | Freq: Three times a day (TID) | INTRAMUSCULAR | Status: DC | PRN

## 2016-12-19 MED ORDER — LEVOFLOXACIN 500 MG PO TABS: 250.0000 mg | ORAL_TABLET | Freq: Every day | ORAL | Status: DC

## 2016-12-19 MED ORDER — HYDROCODONE-ACETAMINOPHEN 5-325 MG PO TABS
1.0000 | ORAL_TABLET | ORAL | Status: DC | PRN
  Administered 2016-12-23: 1 via ORAL
  Filled 2016-12-19: qty 1

## 2016-12-19 MED ORDER — LORAZEPAM 1 MG PO TABS: 1.0000 mg | ORAL_TABLET | Freq: Three times a day (TID) | ORAL | Status: DC | PRN

## 2016-12-19 MED ORDER — NEOMYCIN-POLYMYXIN B GU 40-200000 IR SOLN
Status: DC | PRN
  Administered 2016-12-19: 2 mL

## 2016-12-19 MED ORDER — SODIUM CHLORIDE 0.9 % IV SOLN: Freq: Once | INTRAVENOUS | Status: DC

## 2016-12-19 MED ORDER — FLUOCINOLONE ACETONIDE 0.01 % OT OIL
1.0000 "application " | TOPICAL_OIL | Freq: Every day | OTIC | Status: DC | PRN
  Filled 2016-12-19: qty 20

## 2016-12-19 MED ORDER — ONDANSETRON HCL 4 MG/2ML IJ SOLN
INTRAMUSCULAR | Status: AC
  Filled 2016-12-19: qty 2

## 2016-12-19 MED ORDER — ROCURONIUM BROMIDE 50 MG/5ML IV SOSY
PREFILLED_SYRINGE | INTRAVENOUS | Status: AC
  Filled 2016-12-19: qty 5

## 2016-12-19 MED ORDER — CEFAZOLIN SODIUM-DEXTROSE 2-4 GM/100ML-% IV SOLN
2.0000 g | Freq: Four times a day (QID) | INTRAVENOUS | Status: DC
  Filled 2016-12-19 (×3): qty 100

## 2016-12-19 MED ORDER — LACTATED RINGERS IV SOLN
INTRAVENOUS | Status: DC | PRN
  Administered 2016-12-19 (×2): via INTRAVENOUS

## 2016-12-19 MED ORDER — LATANOPROST 0.005 % OP SOLN
1.0000 [drp] | Freq: Every day | OPHTHALMIC | Status: DC
  Administered 2016-12-19 – 2016-12-22 (×3): 1 [drp] via OPHTHALMIC
  Filled 2016-12-19: qty 2.5

## 2016-12-19 MED ORDER — PROPOFOL 10 MG/ML IV BOLUS
INTRAVENOUS | Status: AC
  Filled 2016-12-19: qty 20

## 2016-12-19 MED ORDER — METOCLOPRAMIDE HCL 10 MG PO TABS: 5.0000 mg | ORAL_TABLET | Freq: Three times a day (TID) | ORAL | Status: DC | PRN

## 2016-12-19 MED ORDER — POTASSIUM CHLORIDE IN NACL 20-0.9 MEQ/L-% IV SOLN
INTRAVENOUS | Status: DC
  Administered 2016-12-19 – 2016-12-20 (×2): via INTRAVENOUS
  Filled 2016-12-19 (×8): qty 1000

## 2016-12-19 MED ORDER — BISACODYL 10 MG RE SUPP
10.0000 mg | Freq: Every day | RECTAL | Status: DC | PRN
  Administered 2016-12-21: 10 mg via RECTAL
  Filled 2016-12-19: qty 1

## 2016-12-19 SURGICAL SUPPLY — 38 items
BIT DRILL 4.3MMS DISTAL GRDTED (BIT) ×1 IMPLANT
BNDG COHESIVE 4X5 TAN STRL (GAUZE/BANDAGES/DRESSINGS) ×2 IMPLANT
BNDG COHESIVE 6X5 TAN STRL LF (GAUZE/BANDAGES/DRESSINGS) ×2 IMPLANT
CANISTER SUCT 1200ML W/VALVE (MISCELLANEOUS) ×2 IMPLANT
CHLORAPREP W/TINT 26ML (MISCELLANEOUS) ×4 IMPLANT
DRAPE C-ARMOR (DRAPES) ×2 IMPLANT
DRAPE SHEET LG 3/4 BI-LAMINATE (DRAPES) ×2 IMPLANT
DRILL 4.3MMS DISTAL GRADUATED (BIT) ×2
DRSG OPSITE POSTOP 3X4 (GAUZE/BANDAGES/DRESSINGS) ×10 IMPLANT
DRSG OPSITE POSTOP 4X6 (GAUZE/BANDAGES/DRESSINGS) ×4 IMPLANT
ELECT CAUTERY BLADE 6.4 (BLADE) ×2 IMPLANT
ELECT REM PT RETURN 9FT ADLT (ELECTROSURGICAL) ×2
ELECTRODE REM PT RTRN 9FT ADLT (ELECTROSURGICAL) ×1 IMPLANT
GAUZE SPONGE 4X4 12PLY STRL (GAUZE/BANDAGES/DRESSINGS) ×2 IMPLANT
GLOVE BIO SURGEON STRL SZ8 (GLOVE) ×4 IMPLANT
GLOVE INDICATOR 8.0 STRL GRN (GLOVE) ×2 IMPLANT
GOWN STRL REUS W/ TWL LRG LVL3 (GOWN DISPOSABLE) ×1 IMPLANT
GOWN STRL REUS W/ TWL XL LVL3 (GOWN DISPOSABLE) ×1 IMPLANT
GOWN STRL REUS W/TWL LRG LVL3 (GOWN DISPOSABLE) ×1
GOWN STRL REUS W/TWL XL LVL3 (GOWN DISPOSABLE) ×1
GUIDEPIN VERSANAIL DSP 3.2X444 ×2 IMPLANT
GUIDEWIRE BALL NOSE 80CM (WIRE) ×2 IMPLANT
HFN RH 130 DEG 9MM X 360MM (Nail) ×2 IMPLANT
MAT BLUE FLOOR 46X72 FLO (MISCELLANEOUS) ×2 IMPLANT
NEEDLE FILTER BLUNT 18X 1/2SAF (NEEDLE) ×1
NEEDLE FILTER BLUNT 18X1 1/2 (NEEDLE) ×1 IMPLANT
NEEDLE HYPO 22GX1.5 SAFETY (NEEDLE) ×2 IMPLANT
NS IRRIG 500ML POUR BTL (IV SOLUTION) ×2 IMPLANT
PACK HIP COMPR (MISCELLANEOUS) ×2 IMPLANT
SCREW BONE CORTICAL 5.0X40 (Screw) ×2 IMPLANT
SCREW LAG HIP NAIL 10.5X95 (Screw) ×2 IMPLANT
STAPLER SKIN PROX 35W (STAPLE) ×2 IMPLANT
STRAP SAFETY BODY (MISCELLANEOUS) ×2 IMPLANT
SUT VIC AB 1 CT1 36 (SUTURE) ×2 IMPLANT
SUT VIC AB 2-0 CT1 (SUTURE) ×4 IMPLANT
SYR 30ML LL (SYRINGE) ×2 IMPLANT
SYRINGE 10CC LL (SYRINGE) ×2 IMPLANT
TAPE MICROFOAM 4IN (TAPE) ×2 IMPLANT

## 2016-12-19 NOTE — ED Notes (Signed)
See triage note. Pt with possible R hip/femur fracture s/p fall yesterday. Pt required x4 person assist to transfer from wheelchair to bed.

## 2016-12-19 NOTE — Consult Note (Signed)
ORTHOPAEDIC CONSULTATION  REQUESTING PHYSICIAN: Auburn BilberryShreyang Patel, MD  Chief Complaint:   Right hip pain.  History of Present Illness: Yesenia Leonard is a 83 y.o. female with a history of mild dementia, hypertension, osteoporosis, hypercalcemia, and hypokalemia who lives independently with her sister. Apparently, she lost her balance and fell last evening, landing on her right side. Initially, she did not seek treatment, but because of continued inability to weight-bear, she was brought to the emergency room today where x-rays demonstrated a mildly displaced 2 part intertrochanteric fracture of her right hip. The patient's sister observed her fall and notes that the patient did not sustain any associated injuries. She did not strike her head or lose consciousness. The patient denies any lightheadedness, dizziness, chest pain, shortness breath, or other symptoms that may have precipitated her fall.  Past Medical History:  Diagnosis Date  . Dementia   . Hypercalcemia   . Hypertension   . Hypokalemia   . Osteoporosis   . PPD positive   . Pre-diabetes    Past Surgical History:  Procedure Laterality Date  . APPENDECTOMY     Social History   Social History  . Marital status: Widowed    Spouse name: N/A  . Number of children: N/A  . Years of education: N/A   Social History Main Topics  . Smoking status: Never Smoker  . Smokeless tobacco: Never Used  . Alcohol use No  . Drug use: No  . Sexual activity: Not Asked   Other Topics Concern  . None   Social History Narrative  . None   Family History  Problem Relation Age of Onset  . Hypertension Mother    No Known Allergies Prior to Admission medications   Medication Sig Start Date End Date Taking? Authorizing Provider  acetaminophen (TYLENOL) 500 MG tablet Take 500 mg by mouth every 6 (six) hours as needed.   Yes Historical Provider, MD  alendronate (FOSAMAX) 70 MG  tablet Take 70 mg by mouth once a week. Take with a full glass of water on an empty stomach.   Yes Historical Provider, MD  amLODipine (NORVASC) 10 MG tablet Take 10 mg by mouth daily.   Yes Historical Provider, MD  aspirin 81 MG chewable tablet Chew 81 mg by mouth daily.   Yes Historical Provider, MD  carvedilol (COREG) 3.125 MG tablet Take 3.125 mg by mouth 2 (two) times daily with a meal.   Yes Historical Provider, MD  cholecalciferol (VITAMIN D) 1000 units tablet Take 1,000 Units by mouth daily.   Yes Historical Provider, MD  Fluocinolone Acetonide 0.01 % OIL Place 1 application in ear(s) daily.   Yes Historical Provider, MD  latanoprost (XALATAN) 0.005 % ophthalmic solution Place 1 drop into both eyes at bedtime. 11/28/16  Yes Historical Provider, MD  lisinopril (PRINIVIL,ZESTRIL) 20 MG tablet Take 40 mg by mouth daily.   Yes Historical Provider, MD  LORazepam (ATIVAN) 1 MG tablet Take 1 tablet (1 mg total) by mouth every 8 (eight) hours as needed (agitation). Patient not taking: Reported on 12/04/2016 11/24/16 11/24/17  Phineas SemenGraydon Goodman, MD   Dg Chest 2 View  Result Date: 12/19/2016 CLINICAL DATA:  Hypotension. EXAM: CHEST  2 VIEW COMPARISON:  Radiographs of December 12, 2016. FINDINGS: The heart size and mediastinal contours are within normal limits. Both lungs are clear. Atherosclerosis of thoracic aorta is noted. No pneumothorax or pleural effusion is noted. The visualized skeletal structures are unremarkable. IMPRESSION: No active cardiopulmonary disease.  Aortic atherosclerosis. Electronically Signed  By: Lupita Raider, M.D.   On: 12/19/2016 13:08   Dg Hip Unilat W Or Wo Pelvis 2-3 Views Right  Result Date: 12/19/2016 CLINICAL DATA:  Right hip pain after fall yesterday. EXAM: DG HIP (WITH OR WITHOUT PELVIS) 2-3V RIGHT COMPARISON:  None. FINDINGS: Moderately displaced intertrochanteric fracture of proximal right femur is noted. No dislocation is noted. IMPRESSION: Moderately displaced  intertrochanteric proximal right femoral fracture. Electronically Signed   By: Lupita Raider, M.D.   On: 12/19/2016 13:10    Positive ROS: All other systems have been reviewed and were otherwise negative with the exception of those mentioned in the HPI and as above.  Physical Exam: General:  Alert, no acute distress Psychiatric:  Patient is mildly demented with normal mood and affect   Cardiovascular:  No pedal edema Respiratory:  No wheezing, non-labored breathing GI:  Abdomen is soft and non-tender Skin:  No lesions in the area of chief complaint Neurologic:  Sensation intact distally Lymphatic:  No axillary or cervical lymphadenopathy  Orthopedic Exam:  Orthopedic examination is limited to the right hip and lower extremity. The right lower extremity is slightly shortened and externally rotated as compared to the left lower extremity. Skin inspection around the right hip is unremarkable. She has moderate tenderness to palpation over the greater trochanteric region laterally. She has more severe pain with any attempted active or passive motion of the right hip. She is able to actively dorsi flex and plantar flex her toes and ankle. Sensation is intact to light touch to all digits patient's Rich has good capillary refill to her right foot.  X-rays:  X-rays of the pelvis and right hip are available for review. These films demonstrate a mildly displaced two-part intertrochanteric fracture of her right hip. No significant degenerative changes of the right hip are noted. No other acute bony abnormalities are identified.  Assessment: Displaced intertrochanteric fracture right hip.  Plan: The treatment options are discussed with the patient and her sister, who is at the bedside, including both surgical and nonsurgical options. The patient and her sister would like to proceed with surgical intervention to include reduction and intramedullary nailing of the right hip fracture. This procedure has  been discussed in detail, as have the potential risks (including bleeding, infection, nerve and/or blood vessel injury, persistent or recurrent pain, stiffness, malunion or nonunion, need for further surgery, blood clots, strokes, heart attacks and/or arrhythmias, etc.) and benefits with the patient states her understanding and wishes to proceed. A consent has been signed.  Thank you for ask me to participate in the care of this most pleasant, yet unfortunate woman. I will be happy to follow her with you.   Maryagnes Amos, MD  Beeper #:  [phone]  12/19/2016 4:18 PM

## 2016-12-19 NOTE — Progress Notes (Signed)
Order for levofloxacin 500 mg PO daily changed to 500 mg PO x1 followed by 250 mg PO Daily per renal function.  Cindi CarbonMary M Garris Melhorn, PharmD 12/19/16 2:26 PM

## 2016-12-19 NOTE — ED Triage Notes (Addendum)
Fell yesterday while walking onto hardwood floors.  Patient lives with sister.  Sister states patient unable to bear weight today and c/o right hip and upper leg pain.  Patient seen at health department yesterday for cough.  Patient had Xray and blood work done.

## 2016-12-19 NOTE — Anesthesia Procedure Notes (Signed)
Procedure Name: Intubation Date/Time: 12/19/2016 4:33 PM Performed by: Dionne Bucy Pre-anesthesia Checklist: Patient identified, Patient being monitored, Timeout performed, Emergency Drugs available and Suction available Patient Re-evaluated:Patient Re-evaluated prior to inductionOxygen Delivery Method: Circle system utilized Preoxygenation: Pre-oxygenation with 100% oxygen Intubation Type: IV induction Ventilation: Mask ventilation without difficulty Laryngoscope Size: Mac and 3 Grade View: Grade II Tube type: Oral Tube size: 7.0 mm Number of attempts: 1 Airway Equipment and Method: Stylet Placement Confirmation: ETT inserted through vocal cords under direct vision,  positive ETCO2 and breath sounds checked- equal and bilateral Secured at: 21 cm Tube secured with: Tape Dental Injury: Teeth and Oropharynx as per pre-operative assessment
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
## 2016-12-19 NOTE — Anesthesia Postprocedure Evaluation (Signed)
Anesthesia Post Note  Patient: Yesenia Leonard  Procedure(s) Performed: Procedure(s) (LRB): INTRAMEDULLARY (IM) NAIL INTERTROCHANTRIC (Right)  Patient location during evaluation: PACU Anesthesia Type: General Level of consciousness: awake and alert Pain management: pain level controlled Vital Signs Assessment: post-procedure vital signs reviewed and stable Respiratory status: spontaneous breathing, nonlabored ventilation, respiratory function stable and patient connected to nasal cannula oxygen Cardiovascular status: blood pressure returned to baseline and stable Postop Assessment: no signs of nausea or vomiting Anesthetic complications: no     Last Vitals:  Vitals:   12/19/16 1824 12/19/16 1839  BP: (!) 143/85   Pulse: 80   Resp: 14   Temp:  36.8 C    Last Pain:  Vitals:   12/19/16 1839  TempSrc:   PainSc: 0-No pain                 Cleda MccreedyJoseph K Maisy [place]

## 2016-12-19 NOTE — Progress Notes (Signed)
ANTIBIOTIC CONSULT NOTE - INITIAL  Pharmacy Consult for Tamiflu  Indication: flu  No Known Allergies  Patient Measurements: Height: 5\' 5"  (165.1 cm) Weight: 130 lb (59 kg) IBW/kg (Calculated) : 57 Adjusted Body Weight:   Vital Signs: Temp: 98.3 F (36.8 C) (02/01 1839) Temp Source: Oral (02/01 1423) BP: 143/85 (02/01 1824) Pulse Rate: 80 (02/01 1824) Intake/Output from previous day: No intake/output data recorded. Intake/Output from this shift: Total I/O In: 2000 [I.V.:1000; IV Piggyback:1000] Out: 100 [Blood:100]  Labs:  Recent Labs  12/19/16 1041  WBC 8.0  HGB 13.4  PLT 124*  CREATININE 0.81   Estimated Creatinine Clearance: 40.7 mL/min (by C-G formula based on SCr of 0.81 mg/dL). No results for input(s): VANCOTROUGH, VANCOPEAK, VANCORANDOM, GENTTROUGH, GENTPEAK, GENTRANDOM, TOBRATROUGH, TOBRAPEAK, TOBRARND, AMIKACINPEAK, AMIKACINTROU, AMIKACIN in the last 72 hours.   Microbiology: No results found for this or any previous visit (from the past 720 hour(s)).  Medical History: Past Medical History:  Diagnosis Date  . Dementia   . Hypercalcemia   . Hypertension   . Hypokalemia   . Osteoporosis   . PPD positive   . Pre-diabetes     Medications:  Scheduled:  . sodium chloride   Intravenous Once  . [START ON 12/20/2016] levofloxacin  250 mg Oral q1800  . oseltamivir  30 mg Oral BID   Assessment: CrCl = 40.7 ml/min   Goal of Therapy:  resolution of infection  Plan:  Expected duration 7 days with resolution of temperature and/or normalization of WBC   Tamiflu 75 mg PO BID originally ordered, will adjust dose to Tamiflu 30 mg PO BID based on CrCl < 60 ml/min.   Yesenia Leonard D 12/19/2016,6:53 PM

## 2016-12-19 NOTE — H&P (Signed)
[employer] - [place] [facility]   PATIENT NAME: Yesenia LukesDoris Leonard    MR#:  [id number]  DATE OF BIRTH:  [birth year]  DATE OF ADMISSION:  12/19/2016  PRIMARY CARE PHYSICIAN: [facility]   REQUESTING/REFERRING PHYSICIAN: Nita Sicklearolina Veronese MD  CHIEF COMPLAINT:   Chief Complaint  Patient presents with  . Fall    HISTORY OF PRESENT ILLNESS: Yesenia LukesDoris Leonard  is a 83 y.o. female with a known history of  Mild dementia, essential hypertension, hypokalemia, osteoporosis, recent PPD positive who is brought in after a fall. Patient started complaining of having right hip pain. Patient fell yesterday evening and was unable to walk. Fall was witnessed by patient's sister. And it was a traumatic fall. There was no loss of consciousness no dizziness or syncope. Patient has been having a him nonproductive cough and upper respiratory congestion recently.  She also reports positive PPD with a negative chest x-ray.   PAST MEDICAL HISTORY:   Past Medical History:  Diagnosis Date  . Dementia   . Hypercalcemia   . Hypertension   . Hypokalemia   . Osteoporosis   . PPD positive   . Pre-diabetes     PAST SURGICAL HISTORY: Past Surgical History:  Procedure Laterality Date  . APPENDECTOMY      SOCIAL HISTORY:  Social History  Substance Use Topics  . Smoking status: Never Smoker  . Smokeless tobacco: Never Used  . Alcohol use No    FAMILY HISTORY:  Family History  Problem Relation Age of Onset  . Hypertension Mother     DRUG ALLERGIES: No Known Allergies  REVIEW OF SYSTEMS:   CONSTITUTIONAL: No fever, fatigue or weakness.  EYES: No blurred or double vision.  EARS, NOSE, AND THROAT: No tinnitus or ear pain.  RESPIRATORY: Positive cough, shortness of breath, wheezing or hemoptysis.  CARDIOVASCULAR: No chest pain, orthopnea, edema.  GASTROINTESTINAL: No nausea, vomiting, diarrhea or abdominal pain.  GENITOURINARY: No dysuria, hematuria.  ENDOCRINE: No  polyuria, nocturia,  HEMATOLOGY: No anemia, easy bruising or bleeding SKIN: No rash or lesion. MUSCULOSKELETAL: No joint pain or arthritis.   NEUROLOGIC: No tingling, numbness, weakness.  PSYCHIATRY: No anxiety or depression.   MEDICATIONS AT HOME:  Prior to Admission medications   Medication Sig Start Date End Date Taking? Authorizing Provider  acetaminophen (TYLENOL) 500 MG tablet Take 500 mg by mouth every 6 (six) hours as needed.   Yes Historical Provider, MD  alendronate (FOSAMAX) 70 MG tablet Take 70 mg by mouth once a week. Take with a full glass of water on an empty stomach.   Yes Historical Provider, MD  amLODipine (NORVASC) 10 MG tablet Take 10 mg by mouth daily.   Yes Historical Provider, MD  aspirin 81 MG chewable tablet Chew 81 mg by mouth daily.   Yes Historical Provider, MD  carvedilol (COREG) 3.125 MG tablet Take 3.125 mg by mouth 2 (two) times daily with a meal.   Yes Historical Provider, MD  cholecalciferol (VITAMIN D) 1000 units tablet Take 1,000 Units by mouth daily.   Yes Historical Provider, MD  Fluocinolone Acetonide 0.01 % OIL Place 1 application in ear(s) daily.   Yes Historical Provider, MD  latanoprost (XALATAN) 0.005 % ophthalmic solution Place 1 drop into both eyes at bedtime. 11/28/16  Yes Historical Provider, MD  lisinopril (PRINIVIL,ZESTRIL) 20 MG tablet Take 40 mg by mouth daily.   Yes Historical Provider, MD  LORazepam (ATIVAN) 1 MG tablet Take 1 tablet (1 mg total) by mouth  every 8 (eight) hours as needed (agitation). Patient not taking: Reported on 12/04/2016 11/24/16 11/24/17  Phineas Semen, MD      PHYSICAL EXAMINATION:   VITAL SIGNS: Blood pressure 132/73, pulse 77, temperature 98.4 F (36.9 C), temperature source Oral, resp. rate (!) 21, height 5\' 5"  (1.651 m), weight 130 lb (59 kg), SpO2 98 %.  GENERAL:  83 y.o.-year-old patient lying in the bed with no acute distress.  EYES: Pupils equal, round, reactive to light and accommodation. No scleral  icterus. Extraocular muscles intact.  HEENT: Head atraumatic, normocephalic. Oropharynx and nasopharynx clear.  NECK:  Supple, no jugular venous distention. No thyroid enlargement, no tenderness.  LUNGS: Normal breath sounds bilaterally, no wheezing, rales,rhonchi or crepitation. No use of accessory muscles of respiration.  CARDIOVASCULAR: S1, S2 normal. No murmurs, rubs, or gallops.  ABDOMEN: Soft, nontender, nondistended. Bowel sounds present. No organomegaly or mass.  EXTREMITIES: No pedal edema, cyanosis, or clubbing.  NEUROLOGIC: Cranial nerves II through XII are intact. Muscle strength 5/5 in all extremities. Sensation intact. Gait not checked.  PSYCHIATRIC: The patient is alert and oriented x 3.  SKIN: No obvious rash, lesion, or ulcer.   LABORATORY PANEL:   CBC  Recent Labs Lab 12/19/16 1041  WBC 8.0  HGB 13.4  HCT 39.7  PLT 124*  MCV 87.9  MCH 29.6  MCHC 33.7  RDW 14.5  LYMPHSABS 1.4  MONOABS 0.8  EOSABS 0.1  BASOSABS 0.0   ------------------------------------------------------------------------------------------------------------------  Chemistries   Recent Labs Lab 12/14/16 1113 12/19/16 1041  NA 144 138  K 3.9 3.5  CL 110 102  CO2 30 29  GLUCOSE 84 102*  BUN 12 18  CREATININE 0.77 0.81  CALCIUM 11.2* 10.2  AST  --  40  ALT  --  23  ALKPHOS  --  50  BILITOT  --  0.9   ------------------------------------------------------------------------------------------------------------------ estimated creatinine clearance is 40.7 mL/min (by C-G formula based on SCr of 0.81 mg/dL). ------------------------------------------------------------------------------------------------------------------ No results for input(s): TSH, T4TOTAL, T3FREE, THYROIDAB in the last 72 hours.  Invalid input(s): FREET3   Coagulation profile No results for input(s): INR, PROTIME in the last 168  hours. ------------------------------------------------------------------------------------------------------------------- No results for input(s): DDIMER in the last 72 hours. -------------------------------------------------------------------------------------------------------------------  Cardiac Enzymes No results for input(s): CKMB, TROPONINI, MYOGLOBIN in the last 168 hours.  Invalid input(s): CK ------------------------------------------------------------------------------------------------------------------ Invalid input(s): POCBNP  ---------------------------------------------------------------------------------------------------------------  Urinalysis    Component Value Date/Time   COLORURINE YELLOW (A) 12/19/2016 1134   APPEARANCEUR CLEAR (A) 12/19/2016 1134   LABSPEC 1.016 12/19/2016 1134   PHURINE 6.0 12/19/2016 1134   GLUCOSEU NEGATIVE 12/19/2016 1134   HGBUR SMALL (A) 12/19/2016 1134   BILIRUBINUR NEGATIVE 12/19/2016 1134   KETONESUR NEGATIVE 12/19/2016 1134   PROTEINUR 30 (A) 12/19/2016 1134   NITRITE NEGATIVE 12/19/2016 1134   LEUKOCYTESUR NEGATIVE 12/19/2016 1134     RADIOLOGY: Dg Chest 2 View  Result Date: 12/19/2016 CLINICAL DATA:  Hypotension. EXAM: CHEST  2 VIEW COMPARISON:  Radiographs of December 12, 2016. FINDINGS: The heart size and mediastinal contours are within normal limits. Both lungs are clear. Atherosclerosis of thoracic aorta is noted. No pneumothorax or pleural effusion is noted. The visualized skeletal structures are unremarkable. IMPRESSION: No active cardiopulmonary disease.  Aortic atherosclerosis. Electronically Signed   By: Lupita Raider, M.D.   On: 12/19/2016 13:08   Dg Hip Unilat W Or Wo Pelvis 2-3 Views Right  Result Date: 12/19/2016 CLINICAL DATA:  Right hip pain after fall yesterday. EXAM: DG HIP (WITH  OR WITHOUT PELVIS) 2-3V RIGHT COMPARISON:  None. FINDINGS: Moderately displaced intertrochanteric fracture of proximal right femur  is noted. No dislocation is noted. IMPRESSION: Moderately displaced intertrochanteric proximal right femoral fracture. Electronically Signed   By: Lupita Raider, M.D.   On: 12/19/2016 13:10    EKG: Orders placed or performed during the hospital encounter of 12/19/16  . ED EKG  . ED EKG  . EKG 12-Lead  . EKG 12-Lead    IMPRESSION AND PLAN: Patient is a 83 year old presenting after a fall noted to have right hip fracture  1. Moderately displaced intertrochanteric proximal right femoral Fracture. The ER physician has spoken to the all call orthopedic physician. Patient may be taken to the operating room today We will keep her nothing by mouth She is at moderate risk of surgical complications. No further cardiopulmonary workup needed  2. Cough congestion and upper respiratory infection symptoms I will place patient on Levaquin Check flu  3. Essential hypertension we'll continue amlodipine and Coreg and lisinopril hold these as needed  4. Glaucoma continue eyedrops  5. Anxiety continue Ativan when necessary  6. Misc: lovenox post surgery if ok with ortho  7. + ppd neg cxr, sw consult will need snf for rehab    All the records are reviewed and case discussed with ED provider. Management plans discussed with the patient, family and they are in agreement.  CODE STATUS: Code Status History    This patient does not have a recorded code status. Please follow your organizational policy for patients in this situation.       TOTAL TIME TAKING CARE OF THIS PATIENT:43minutes.    Auburn Bilberry M.D on 12/19/2016 at 2:26 PM  Between 7am to 6pm - Pager - [phone]  After 6pm go to [email or web address] - password [email or web address] [facility]  [facility] [place] [facility]  Office  [phone]  CC: Primary care physician; [facility]

## 2016-12-19 NOTE — Transfer of Care (Signed)
Immediate Anesthesia Transfer of Care Note  Patient: Yesenia Leonard  Procedure(s) Performed: Procedure(s): INTRAMEDULLARY (IM) NAIL INTERTROCHANTRIC (Right)  Patient Location: PACU  Anesthesia Type:General  Level of Consciousness: awake  Airway & Oxygen Therapy: Patient connected to face mask oxygen  Post-op Assessment: Post -op Vital signs reviewed and stable  Post vital signs: stable  Last Vitals:  Vitals:   12/19/16 1500 12/19/16 1812  BP: 114/70 125/88  Pulse: 72   Resp: (!) 21 18  Temp:      Last Pain:  Vitals:   12/19/16 1423  TempSrc: Oral  PainSc:          Complications: No apparent anesthesia complications

## 2016-12-19 NOTE — ED Provider Notes (Signed)
[facility] Emergency Department Provider Note  ____________________________________________  Time seen: Approximately 11:15 AM  I have reviewed the triage vital signs and the nursing notes.   HISTORY  Chief Complaint Fall   HPI Yesenia Leonard is a 83 y.o. female the history of dementia, hypertension, osteoporosis who presents for evaluation of right hip pain status post mechanical fall. Patient sustained a fall yesterday evening and since then has been unable to walk. Every time she moves her right leg she complains of severe pain in the right hip. Patient denies any pain at rest. She reports that the pain is located in her proximal femur and hip area and nonradiating.  fall was witnessed by patient's sister who she lives with with no head trauma or LOC. Patient does not take any blood thinners. Patient tells me that she was walking too fast with her walker when she fell. She denies palpitations, dizziness, headache, chest pain, shortness of breath, back pain both preceding or after the fall.   Past Medical History:  Diagnosis Date  . Dementia   . Hypercalcemia   . Hypertension   . Hypokalemia   . Osteoporosis   . Pre-diabetes     Patient Active Problem List   Diagnosis Date Noted  . Alzheimer's dementia 12/04/2016    History reviewed. No pertinent surgical history.  Prior to Admission medications   Medication Sig Start Date End Date Taking? Authorizing Provider  alendronate (FOSAMAX) 70 MG tablet Take 70 mg by mouth once a week. Take with a full glass of water on an empty stomach.    Historical Provider, MD  amLODipine (NORVASC) 10 MG tablet Take 10 mg by mouth daily.    Historical Provider, MD  aspirin 81 MG chewable tablet Chew 81 mg by mouth daily.    Historical Provider, MD  carvedilol (COREG) 3.125 MG tablet Take 3.125 mg by mouth 2 (two) times daily with a meal.    Historical Provider, MD  cholecalciferol (VITAMIN D) 1000 units tablet Take  1,000 Units by mouth daily.    Historical Provider, MD  ciprofloxacin (CIPRO) 500 MG tablet Take 1 tablet (500 mg total) by mouth 2 (two) times daily. 12/14/16 12/21/16  Emily Filbert, MD  Fluocinolone Acetonide 0.01 % OIL Place 1 application in ear(s) daily.    Historical Provider, MD  latanoprost (XALATAN) 0.005 % ophthalmic solution Place 1 drop into both eyes at bedtime. 11/28/16   Historical Provider, MD  lisinopril (PRINIVIL,ZESTRIL) 20 MG tablet Take 40 mg by mouth daily.    Historical Provider, MD  LORazepam (ATIVAN) 1 MG tablet Take 1 tablet (1 mg total) by mouth every 8 (eight) hours as needed (agitation). Patient not taking: Reported on 12/04/2016 11/24/16 11/24/17  Phineas Semen, MD  potassium chloride 20 MEQ/15ML (10%) SOLN Take 60 mEq by mouth daily.    Historical Provider, MD    Allergies Patient has no known allergies.  No family history on file.  Social History Social History  Substance Use Topics  . Smoking status: Never Smoker  . Smokeless tobacco: Never Used  . Alcohol use No    Review of Systems Constitutional: Negative for fever. Eyes: Negative for visual changes. ENT: Negative for facial injury or neck injury Cardiovascular: Negative for chest injury. Respiratory: Negative for shortness of breath. Negative for chest wall injury. Gastrointestinal: Negative for abdominal pain or injury. Genitourinary: Negative for dysuria. Musculoskeletal: Negative for back injury, + R hip pain. Skin: Negative for laceration/abrasions. Neurological: Negative for  head injury.  ____________________________________________   PHYSICAL EXAM:  VITAL SIGNS: ED Triage Vitals  Enc Vitals Group     BP 12/19/16 1027 (!) 81/52     Pulse Rate 12/19/16 1027 61     Resp 12/19/16 1027 15     Temp -- 98.4     Temp Source 12/19/16 1027 Oral     SpO2 --      Weight 12/19/16 1029 130 lb (59 kg)     Height 12/19/16 1029 5\' 5"  (1.651 m)     Head Circumference --      Peak Flow --       Pain Score 12/19/16 1030 0     Pain Loc --      Pain Edu? --      Excl. in GC? --    Full spinal precautions maintained throughout the trauma exam. Constitutional: Alert and oriented. No acute distress. Does not appear intoxicated. HEENT Head: Normocephalic and atraumatic. Face: No facial bony tenderness. Stable midface Ears: No hemotympanum bilaterally. No Battle sign Eyes: No eye injury. PERRL. No raccoon eyes Nose: Nontender. No epistaxis. No rhinorrhea Mouth/Throat: Mucous membranes are moist. No oropharyngeal blood. No dental injury. Airway patent without stridor. Normal voice. Neck: no C-collar in place. No midline c-spine tenderness.  Cardiovascular: Normal rate, regular rhythm. Normal and symmetric distal pulses are present in all extremities. Pulmonary/Chest: Chest wall is stable and nontender to palpation/compression. Normal respiratory effort. Breath sounds are normal. No crepitus.  Abdominal: Soft, nontender, non distended. Musculoskeletal: significant pain located in the R hip and proximal femur with internal and external rotation, no deformity. Nontender with normal full range of motion in all other extremities. No deformities. No thoracic or lumbar midline spinal tenderness. Pelvis is stable. Skin: Skin is warm, dry and intact. No abrasions or contutions. Psychiatric: Speech and behavior are appropriate. Neurological: Normal speech and language. Moves all extremities to command. No gross focal neurologic deficits are appreciated.  Glascow Coma Score: 4 - Opens eyes on own 6 - Follows simple motor commands 5 - Alert and oriented GCS: 15   ____________________________________________   LABS (all labs ordered are listed, but only abnormal results are displayed)  Labs Reviewed  URINALYSIS, COMPLETE (UACMP) WITH MICROSCOPIC - Abnormal; Notable for the following:       Result Value   Color, Urine YELLOW (*)    APPearance CLEAR (*)    Hgb urine dipstick SMALL (*)     Protein, ur 30 (*)    Squamous Epithelial / LPF 0-5 (*)    All other components within normal limits  CBC WITH DIFFERENTIAL/PLATELET - Abnormal; Notable for the following:    Platelets 124 (*)    All other components within normal limits  COMPREHENSIVE METABOLIC PANEL - Abnormal; Notable for the following:    Glucose, Bld 102 (*)    Albumin 3.4 (*)    All other components within normal limits  CULTURE, BLOOD (ROUTINE X 2)  CULTURE, BLOOD (ROUTINE X 2)  URINE CULTURE  LACTIC ACID, PLASMA  PROTIME-INR  TYPE AND SCREEN   ____________________________________________  EKG  ED ECG REPORT I, Nita Sicklearolina Seferino Oscar, the attending physician, personally viewed and interpreted this ECG.  Normal sinus rhythm, rate of 98, normal intervals, normal axis, no ST elevations or depressions.  ____________________________________________  RADIOLOGY  DG R hip: Moderately displaced intertrochanteric proximal right femoral fracture.  CXR: No active cardiopulmonary disease. Aortic atherosclerosis ____________________________________________   PROCEDURES  Procedure(s) performed: None Procedures Critical Care performed:  None  ____________________________________________   INITIAL IMPRESSION / ASSESSMENT AND PLAN / ED COURSE  83 y.o. female the history of dementia, hypertension, osteoporosis who presents for evaluation of right hip pain status post mechanical fall last night. Patient has significant tenderness with internal and external rotation of the right hip with no obvious deformities. We'll get an x-ray. We'll give fentanyl for the pain. Blood pressure here is noted to be low and IV fluids have been ordered. Patient is on medications for her blood pressure which will be adjusted prior to her discharge. Even though the fall was witnessed by patient's sister and seemed mechanical in nature since patient is demented and with a lower blood pressure will get blood work and UA to look for possible  etiologies leading to the cause such as UTI, AKI, electrolyte abnormalities. Temp measured by me during my evaluation of the patient 98.57F. Lactic acid and blood cultures were sent by triage nurse prior to my evaluation. No signs or symptoms of sepsis.  Clinical Course as of Dec 19 1330  Thu Dec 19, 2016  1330 X-ray concerning for right hip fracture. Will discuss with Dr. Joice Lofts for admission. Foley ordered. Labs with no acute findings. Patient NPO, maintenance IVF ordered.   [CV]    Clinical Course User Index [CV] Nita Sickle, MD    Pertinent labs & imaging results that were available during my care of the patient were reviewed by me and considered in my medical decision making (see chart for details).    ____________________________________________   FINAL CLINICAL IMPRESSION(S) / ED DIAGNOSES  Final diagnoses:  Closed fracture of right hip, initial encounter (HCC)  Fall, initial encounter      NEW MEDICATIONS STARTED DURING THIS VISIT:  New Prescriptions   No medications on file     Note:  This document was prepared using Dragon voice recognition software and may include unintentional dictation errors.    Nita Sickle, MD 12/19/16 1332

## 2016-12-19 NOTE — ED Notes (Signed)
Pt O2 sat 89% on RA after dilaudid. Pt must lay flat at this time until hip fx ruled out. Placed on 2L O2 via n/c. O2 sat on 2L 98%.

## 2016-12-19 NOTE — Anesthesia Preprocedure Evaluation (Addendum)
Anesthesia Evaluation  Patient identified by MRN, date of birth, ID band Patient awake and Patient confused    Reviewed: Allergy & Precautions, H&P , NPO status , Patient's Chart, lab work & pertinent test results  Airway Mallampati: III  TM Distance: >3 FB Neck ROM: limited    Dental  (+) Poor Dentition, Chipped   Pulmonary neg pulmonary ROS, neg shortness of breath,    Pulmonary exam normal breath sounds clear to auscultation       Cardiovascular Exercise Tolerance: Good hypertension, (-) angina(-) Past MI and (-) DOE Normal cardiovascular exam Rhythm:regular Rate:Normal     Neuro/Psych PSYCHIATRIC DISORDERS negative neurological ROS     GI/Hepatic negative GI ROS, Neg liver ROS, neg GERD  ,  Endo/Other  negative endocrine ROS  Renal/GU      Musculoskeletal   Abdominal   Peds  Hematology negative hematology ROS (+)   Anesthesia Other Findings Past Medical History: No date: Dementia No date: Hypercalcemia No date: Hypertension No date: Hypokalemia No date: Osteoporosis No date: PPD positive No date: Pre-diabetes  Past Surgical History: No date: APPENDECTOMY  BMI    Body Mass Index:  21.63 kg/m      Reproductive/Obstetrics negative OB ROS                           Anesthesia Physical Anesthesia Plan  ASA: III  Anesthesia Plan: General   Post-op Pain Management:    Induction:   Airway Management Planned:   Additional Equipment:   Intra-op Plan:   Post-operative Plan:   Informed Consent: I have reviewed the patients History and Physical, chart, labs and discussed the procedure including the risks, benefits and alternatives for the proposed anesthesia with the patient or authorized representative who has indicated his/her understanding and acceptance.   Dental Advisory Given  Plan Discussed with: CRNA, Anesthesiologist and Surgeon  Anesthesia Plan Comments:  (Patient ate this morning at 0800 so plan to intubate.  Due to patients dementia I consented the patient and her family member.  Positive PPD, Negative CXR per medicine, plan for our team to wear protective equipment including masks when interacting with this patient.  Patient and family informed that patient is higher risk for complications from anesthesia during this procedure due to their medical history and age including but not limited to post operative cognitive dysfunction.  They voiced understanding. )      Anesthesia Quick Evaluation

## 2016-12-19 NOTE — ED Notes (Signed)
Pt transported to OR by tech Dorma Russell([place]). All belongings given to family except for rings. Pt to give those to family upon arrival in OR.

## 2016-12-19 NOTE — Anesthesia Post-op Follow-up Note (Cosign Needed)
Anesthesia QCDR form completed.        

## 2016-12-20 ENCOUNTER — Encounter: Payer: Self-pay | Admitting: Surgery

## 2016-12-20 LAB — URINE CULTURE: Culture: NO GROWTH

## 2016-12-20 LAB — BASIC METABOLIC PANEL
Anion gap: 5 (ref 5–15)
BUN: 16 mg/dL (ref 6–20)
CHLORIDE: 109 mmol/L (ref 101–111)
CO2: 27 mmol/L (ref 22–32)
CREATININE: 0.67 mg/dL (ref 0.44–1.00)
Calcium: 9.3 mg/dL (ref 8.9–10.3)
GFR calc Af Amer: >60 mL/min (ref >60)
GFR calc non Af Amer: >60 mL/min (ref >60)
GLUCOSE: 125 mg/dL — AB (ref 65–99)
Potassium: 3.7 mmol/L (ref 3.5–5.1)
SODIUM: 141 mmol/L (ref 135–145)

## 2016-12-20 LAB — CBC WITH DIFFERENTIAL/PLATELET
Basophils Absolute: 0.1 10*3/uL (ref 0–0.1)
Basophils Relative: 2 %
EOS ABS: 0 10*3/uL (ref 0–0.7)
EOS PCT: 0 %
HCT: 32.4 % — ABNORMAL LOW (ref 35.0–47.0)
Hemoglobin: 11 g/dL — ABNORMAL LOW (ref 12.0–16.0)
LYMPHS ABS: 0.3 10*3/uL — AB (ref 1.0–3.6)
LYMPHS PCT: 6 %
MCH: 29.7 pg (ref 26.0–34.0)
MCHC: 33.9 g/dL (ref 32.0–36.0)
MCV: 87.8 fL (ref 80.0–100.0)
MONO ABS: 0.2 10*3/uL (ref 0.2–0.9)
MONOS PCT: 5 %
Neutro Abs: 4.5 10*3/uL (ref 1.4–6.5)
Neutrophils Relative %: 87 %
PLATELETS: 89 10*3/uL — AB (ref 150–440)
RBC: 3.69 MIL/uL — AB (ref 3.80–5.20)
RDW: 14 % (ref 11.5–14.5)
WBC: 5.2 10*3/uL (ref 3.6–11.0)

## 2016-12-20 MED ORDER — DOCUSATE SODIUM 100 MG PO CAPS: 100.0000 mg | ORAL_CAPSULE | Freq: Two times a day (BID) | ORAL | 0 refills | Status: DC

## 2016-12-20 MED ORDER — OSELTAMIVIR PHOSPHATE 30 MG PO CAPS: 30.0000 mg | ORAL_CAPSULE | Freq: Two times a day (BID) | ORAL | Status: DC

## 2016-12-20 MED ORDER — ENOXAPARIN SODIUM 40 MG/0.4ML ~~LOC~~ SOLN: 40.0000 mg | SUBCUTANEOUS | Status: DC

## 2016-12-20 MED ORDER — LORAZEPAM 1 MG PO TABS: 1.0000 mg | ORAL_TABLET | Freq: Three times a day (TID) | ORAL | 0 refills | Status: DC | PRN

## 2016-12-20 MED ORDER — CARVEDILOL 3.125 MG PO TABS
3.1250 mg | ORAL_TABLET | Freq: Two times a day (BID) | ORAL | Status: DC
  Filled 2016-12-20: qty 1

## 2016-12-20 MED ORDER — HYDROCODONE-ACETAMINOPHEN 5-325 MG PO TABS: 1.0000 | ORAL_TABLET | Freq: Four times a day (QID) | ORAL | 0 refills | Status: DC | PRN

## 2016-12-20 MED ORDER — SODIUM CHLORIDE 0.9 % IV BOLUS (SEPSIS)
500.0000 mL | Freq: Once | INTRAVENOUS | Status: AC
  Administered 2016-12-20: 500 mL via INTRAVENOUS

## 2016-12-20 NOTE — Clinical Social Work Note (Addendum)
Clinical Social Work Assessment  Patient Details  Name: Yesenia Leonard MRN: [id number] Date of Birth: 10-08-1924  Date of referral:  12/20/16               Reason for consult:  Facility Placement                Permission sought to share information with:  Chartered certified accountant granted to share information::  Yes, Verbal Permission Granted  Name::      [place]::   [place]   Relationship::     Contact Information:     Housing/Transportation Living arrangements for the past 2 months:  Single Family Home Source of Information:  Patient, Other (Comment Required) (Sister ) Patient Interpreter Needed:  None Criminal Activity/Legal Involvement Pertinent to Current Situation/Hospitalization:  No - Comment as needed Significant Relationships:  Siblings Lives with:  Siblings Do you feel safe going back to the place where you live?  Yes Need for family participation in patient care:  Yes (Comment)  Care giving concerns:  Patient is staying temporarily with her sister Yesenia Leonard in [place].    Social Worker assessment / plan:  Holiday representative (CSW) received SNF consult. PT is recommending SNF. CSW met with patient to discuss D/C plan. Patient was alone at bedside and laying in the bed. Patient was oriented to self and place. Patient reported that she has been staying with her sister and she does not know where her daughter Yesenia Leonard is at. CSW explained that PT is recommending SNF. Patient is agreeable to SNF search in [facility]. FL2 complete and faxed out. CSW explained to patient that she will have limited bed offers because she is positive for the flu. CSW presented bed offers (patient only had [facility] and [facility]). Patient chose [facility].  CSW contacted patient's sister Yesenia Leonard. Per sister patient's daughter Yesenia Leonard came to [facility] ED with patient on 12/14/16 stating that they were homeless  and had no where to go. Per sister they have been desperately trying to place patient however the medicaid worker said she does not qualify for medicaid because her social security check is a little bit over the required amount. Per sister Yesenia Leonard went to stay at a cousins house and has not been heard of. Per sister Yesenia Leonard is not reliable. Sister reported that patient is currently staying at their sister Yesenia Leonard's house however that cannot be permanent. Per sister, Yesenia Leonard is elderly and also taking care of her husband with dementia. Sister reported that Yesenia Leonard feels terrible that patient fell at her house. CSW provided emotional support and explained that patient can apply for long term care medicaid while in rehab because the income level can increase for medicaid while patient is in a facility. Sister was very happy to hear that and reported that she would look into medicaid again. Sister is agreeable for patient to go to [facility] when stable.   Per [facility] admissions coordinator at [facility] they will accept patient on Sunday 12/22/16. CSW faxed chest x-ray that showed that patient was negative for TB to [facility] at [facility] request. CSW will continue to follow and assist as needed.  CSW sent D/C Summary to [facility] today.   Employment status:  Retired Forensic scientist:  Medicare PT Recommendations:  [place] / Referral to community resources:  [place]  Patient/Family's Response to care:  Patient and sister Yesenia Leonard are agreeable  for patient to go to [facility] when stable.   Patient/Family's Understanding of and Emotional Response to Diagnosis, Current Treatment, and Prognosis:  Patient and sister were very pleasant and thanked CSW for assistance.   Emotional Assessment Appearance:  Appears stated age Attitude/Demeanor/Rapport:    Affect (typically observed):  Accepting, Adaptable, Pleasant Orientation:   Oriented to Self, Oriented to Place, Oriented to  Time, Oriented to Situation Alcohol / Substance use:  Not Applicable Psych involvement (Current and /or in the community):  No (Comment)  Discharge Needs  Concerns to be addressed:  Discharge Planning Concerns Readmission within the last 30 days:  No Current discharge risk:  Dependent with Mobility Barriers to Discharge:  Continued Medical Work up   [employer], Veronia Beets, LCSW 12/20/2016, 2:09 PM

## 2016-12-20 NOTE — Progress Notes (Signed)
[facility] [employer] [facility] [place] [facility]   PATIENT NAME: Yesenia LukesDoris Leonard    MR#:  [id number]  DATE OF BIRTH:  1924/10/31  SUBJECTIVE:  CHIEF COMPLAINT:   Chief Complaint  Patient presents with  . Fall   The patient is demented. BP 85/46 REVIEW OF SYSTEMS:  Review of Systems  Unable to perform ROS: Dementia    DRUG ALLERGIES:  No Known Allergies VITALS:  Blood pressure (!) 93/53, pulse 67, temperature 98.2 F (36.8 C), temperature source Oral, resp. rate 16, height 5\' 5"  (1.651 m), weight 130 lb (59 kg), SpO2 98 %. PHYSICAL EXAMINATION:  Physical Exam  Constitutional:  Malnutrition.  HENT:  Head: Normocephalic and atraumatic.  Eyes: Conjunctivae and EOM are normal.  Neck: Normal range of motion. Neck supple. No JVD present. No tracheal deviation present.  Cardiovascular: Normal rate, regular rhythm and normal heart sounds.   Pulmonary/Chest: Effort normal and breath sounds normal. No respiratory distress. She has no wheezes. She has no rales.  Abdominal: Soft. Bowel sounds are normal. She exhibits no distension. There is no tenderness.  Musculoskeletal: She exhibits no edema.  Neurological: No cranial nerve deficit.  Skin: No rash noted. No erythema.  Psychiatric:  Demented   LABORATORY PANEL:   CBC  Recent Labs Lab 12/20/16 0353  WBC 5.2  HGB 11.0*  HCT 32.4*  PLT 89*   ------------------------------------------------------------------------------------------------------------------ Chemistries   Recent Labs Lab 12/19/16 1041 12/20/16 0353  NA 138 141  K 3.5 3.7  CL 102 109  CO2 29 27  GLUCOSE 102* 125*  BUN 18 16  CREATININE 0.81 0.67  CALCIUM 10.2 9.3  AST 40  --   ALT 23  --   ALKPHOS 50  --   BILITOT 0.9  --    RADIOLOGY:  Dg Hip Operative Unilat W Or W/o Pelvis Right  Result Date: 12/19/2016 CLINICAL DATA:  ORIF proximal right femur fracture EXAM: OPERATIVE RIGHT HIP (WITH PELVIS IF PERFORMED) 4 VIEWS TECHNIQUE: Fluoroscopic  spot image(s) were submitted for interpretation post-operatively. COMPARISON:  12/19/2016 right hip radiographs FINDINGS: Fluoroscopy time 1 minutes 18 seconds. Four nondiagnostic spot fluoroscopic intraoperative radiographs demonstrate postsurgical changes from ORIF right intertrochanteric proximal femur fracture, which is transfixed by intramedullary rod with interlocking right femoral neck screw and interlocking distal screw. IMPRESSION: Intraoperative fluoroscopic guidance for ORIF intratrochanteric right proximal femur fracture . Electronically Signed   By: Delbert PhenixJason A Poff M.D.   On: 12/19/2016 18:27   ASSESSMENT AND PLAN:   Patient is a 83 year old presenting after a fall noted to have right hip fracture  1. Moderately displaced intertrochanteric proximal right femoral Fracture. S/p Reduction and internal fixation of two-part intertrochanteric right hip fracture with [employer] TFN nail. POD1. Pain control and DVT prophylaxis.  2. Influenza A Continue Tamiflu and discontinue Levaquin  * Hypotension. Hold hypertension medication, give normal saline bolus 500 ML, BP up to 93/53. Continue normal saline IV.  3. Essential hypertension. hold amlodipine and Coreg and lisinopril due to hypotension.  4. Glaucoma continue eyedrops  5. Anxiety continue Ativan when necessary  6. Misc: lovenox post surgery if ok with ortho  7. + ppd neg cxr, sw consult will need snf for rehab  Thrombocytopenia. Platelets decreased to 89. CBC.  All the records are reviewed and case discussed with Care Management/Social Worker. Management plans discussed with the patient, family and they are in agreement.  CODE STATUS: full code.  TOTAL TIME TAKING CARE OF THIS PATIENT: 36 minutes.   More  than 50% of the time was spent in counseling/coordination of care: YES  POSSIBLE D/C IN 2 DAYS, DEPENDING ON CLINICAL CONDITION.   [name] M.D on 12/20/2016 at 3:31 PM  Between 7am to 6pm - Pager -  [phone]  After 6pm go to [email or web address] - Scientist, research (life sciences) [facility]  Office  [phone]  CC: Primary care physician; [facility]  Note: This dictation was prepared with Dragon dictation along with smaller phrase technology. Any transcriptional errors that result from this process are unintentional.

## 2016-12-20 NOTE — Progress Notes (Signed)
  Subjective: 1 Day Post-Op Procedure(s) (LRB): INTRAMEDULLARY (IM) NAIL INTERTROCHANTRIC (Right) Patient reports pain as mild.   Patient is well, but treating treated for influenza.  On droplet precautions. PT and care management to assist with discharge. Negative for chest pain and shortness of breath Fever: no Gastrointestinal:Negative for nausea and vomiting  Objective: Vital signs in last 24 hours: Temp:  [97.3 F (36.3 C)-98.5 F (36.9 C)] 97.3 F (36.3 C) (02/02 0437) Pulse Rate:  [60-87] 61 (02/02 0437) Resp:  [12-27] 20 (02/02 0437) BP: (81-147)/(48-88) 93/48 (02/02 0437) SpO2:  [89 %-100 %] 97 % (02/02 0437) Weight:  [59 kg (130 lb)] 59 kg (130 lb) (02/01 1029)  Intake/Output from previous day:  Intake/Output Summary (Last 24 hours) at 12/20/16 0758 Last data filed at 12/20/16 0400  Gross per 24 hour  Intake          3238.33 ml  Output              100 ml  Net          3138.33 ml    Intake/Output this shift: No intake/output data recorded.  Labs:  Recent Labs  12/19/16 1041 12/20/16 0353  HGB 13.4 11.0*    Recent Labs  12/19/16 1041 12/20/16 0353  WBC 8.0 5.2  RBC 4.52 3.69*  HCT 39.7 32.4*  PLT 124* 89*    Recent Labs  12/19/16 1041 12/20/16 0353  NA 138 141  K 3.5 3.7  CL 102 109  CO2 29 27  BUN 18 16  CREATININE 0.81 0.67  GLUCOSE 102* 125*  CALCIUM 10.2 9.3    Recent Labs  12/19/16 1444  INR 1.14     EXAM General - Patient is Alert and lacking, able to answer simple questions but not alert to her environment Extremity - ABD soft Sensation intact distally Dorsiflexion/Plantar flexion intact Incision: scant drainage No cellulitis present Dressing/Incision - blood tinged drainage Motor Function - intact, moving foot and toes well on exam.  Abdomen soft with normal BS, no tympany.  Past Medical History:  Diagnosis Date  . Dementia   . Hypercalcemia   . Hypertension   . Hypokalemia   . Osteoporosis   . PPD positive    . Pre-diabetes     Assessment/Plan: 1 Day Post-Op Procedure(s) (LRB): INTRAMEDULLARY (IM) NAIL INTERTROCHANTRIC (Right) Active Problems:   Hip fracture (HCC)  Estimated body mass index is 21.63 kg/m as calculated from the following:   Height as of this encounter: 5\' 5"  (1.651 m).   Weight as of this encounter: 59 kg (130 lb). Advance diet Up with therapy   BP 93/48, continue IVF until tolerating PO diet.  Bolus may be necessary. Up with therapy today. Labs reviewed, CBC and BMP ordered for tomorrow morning.  DVT Prophylaxis - Lovenox, Foot Pumps and TED hose Weight-Bearing as tolerated to right leg  J. Horris LatinoLance Eldo Umanzor, PA-C [facility] 12/20/2016, 7:58 AM

## 2016-12-20 NOTE — Progress Notes (Signed)
Physical Therapy Treatment Patient Details Name: Yesenia Leonard MRN: [id number] DOB: 1923/12/29 Today's Date: 12/20/2016    History of Present Illness Pt is a 83 y/o female that presents after fall at home suffering a R hip fx, repaired via ORIF. History of dementia and osteoporosis.     PT Comments    Pt alert and awake and responsive to basic commands during PT treatment. Blood pressure assessed in supine and ws 80/43. Pt limited to Therex due to orthostatic hypotension and symptoms of dizziness when changing positions. Able to participate in supine therex w/ min assist for SLR, hip abd and heel slides. Pt's blood pressure rose to 93/49 and attempted sitting, Pt able to move to sitting position w/ mod assist and cuing for hand placement. She was able to maintain sitting posture for 2 minutes and started complaining of dizziness, blood pressure dropped to 70/51 and patient was safely returned to supine w/ max assist. Her blood pressure rose up to 108/51 in supine. Pt currently displays poor activity tolerance secondary to low blood pressure as well as strength and ROM deficits that limit safe household mobility. She will continue to benefit from skilled PT and recommend dc to STR following acute hospitalization.    Follow Up Recommendations  SNF     Equipment Recommendations       Recommendations for Other Services       Precautions / Restrictions Precautions Precautions: Fall Restrictions Weight Bearing Restrictions: Yes RLE Weight Bearing: Weight bearing as tolerated    Mobility  Bed Mobility Overal bed mobility: Needs Assistance Bed Mobility: Supine to Sit;Sit to Supine     Supine to sit: Mod assist Sit to supine: Max assist   General bed mobility comments: follows basic cuing for hand placement, mod assist to guide R LE and scoot to EOB  Transfers                 General transfer comment: not assessed due to Low BP   Ambulation/Gait             General  Gait Details: not assessed due to Low BP   Stairs            Wheelchair Mobility    Modified Rankin (Stroke Patients Only)       Balance Overall balance assessment: Needs assistance Sitting-balance support: Bilateral upper extremity supported;Feet supported Sitting balance-Leahy Scale: Fair Sitting balance - Comments: intermittent min assist and cuing to maintain upright posture        Standing balance comment: did not attempt                     Cognition Arousal/Alertness: Awake/alert Behavior During Therapy: WFL for tasks assessed/performed Overall Cognitive Status: History of cognitive impairments - at baseline                      Exercises Other Exercises Other Exercises: supine therex: to improve LE strength for functional activities; 1x15; AROM; SAQs, heel slides, ankle pumps, hip add, hip abd, SLR; verbal and tactile cuing, min assist for SLR and hip abd Other Exercises: Sitting therex; to improve LE strength for functional activities, 1x10; AROM, seated marching; cuing, decreased strength for R LE     General Comments        Pertinent Vitals/Pain Faces Pain Scale: Hurts little more Pain Location: R hip Pain Descriptors / Indicators: Aching;Operative site guarding Pain Intervention(s): Monitored during session;Repositioned;Limited activity within patient's tolerance  Home Living                      Prior Function            PT Goals (current goals can now be found in the care plan section) Acute Rehab PT Goals Patient Stated Goal: To move around more independerntly PT Goal Formulation: With patient Time For Goal Achievement: 01/03/17 Potential to Achieve Goals: Fair Progress towards PT goals: Progressing toward goals    Frequency    BID      PT Plan Current plan remains appropriate    Co-evaluation             End of Session   Activity Tolerance: Treatment limited secondary to medical complications  (Comment) (decreased BP) Patient left: in bed;with bed alarm set;with call bell/phone within reach;with nursing/sitter in room     Time: 1445-1513 PT Time Calculation (min) (ACUTE ONLY): 28 min  Charges:                       G Codes:      [employer] Kanani Mowbray Student PT 12/20/2016, 3:49 PM

## 2016-12-20 NOTE — Discharge Summary (Signed)
[employer] [facility] [place] [facility]   PATIENT NAME: Yesenia LukesDoris Leonard    MR#:  [id number]  DATE OF BIRTH:  1924-03-01  DATE OF ADMISSION:  12/19/2016   ADMITTING PHYSICIAN: Auburn BilberryShreyang Patel, MD  DATE OF DISCHARGE: 12/22/2016 PRIMARY CARE PHYSICIAN: [facility]   ADMISSION DIAGNOSIS:  Surgery, elective [Z41.9] Fall, initial encounter [W19.XXXA] Closed fracture of right hip, initial encounter (HCC) [S72.001A] DISCHARGE DIAGNOSIS:  Active Problems:   Hip fracture (HCC) Influenza A Hypotension SECONDARY DIAGNOSIS:   Past Medical History:  Diagnosis Date  . Dementia   . Hypercalcemia   . Hypertension   . Hypokalemia   . Osteoporosis   . PPD positive   . Pre-diabetes    HOSPITAL COURSE:  Patient is a [age] year old presenting after a fall noted to have right hip fracture  1. Moderately displaced intertrochanteric proximal right femoral Fracture. S/p Reduction and internal fixation of two-part intertrochanteric righthip fracture with Biomet Affixis TFN nail. POD1. Pain control and DVT prophylaxis.  2. Influenza A Continue Tamiflu and discontinue Levaquin  * Hypotension. Hold hypertension medication, give normal saline bolus 500 ML, BP up to 93/53. Continue normal saline IV.  3. Essential hypertension. hold amlodipine and Coreg and lisinopril due to hypotension.  4. Glaucoma continue eyedrops  5. Anxiety continue Ativan when necessary  6. Misc: lovenox post surgery if ok with ortho  7. + ppd neg cxr, sw consult will need snf for rehab  Thrombocytopenia. Platelets decreased to 89. CBC. Malnutrition. Dietitian consult. DISCHARGE CONDITIONS:  The patient will be discharged to skilled nursing facility on Sunday if stable. CONSULTS OBTAINED:  Treatment Team:  Christena FlakeJohn J Poggi, MD DRUG ALLERGIES:  No Known Allergies DISCHARGE MEDICATIONS:   Allergies as of 12/20/2016   No Known Allergies     Medication List    STOP taking  these medications   amLODipine 10 MG tablet Commonly known as:  NORVASC   carvedilol 3.125 MG tablet Commonly known as:  COREG   lisinopril 20 MG tablet Commonly known as:  PRINIVIL,ZESTRIL     TAKE these medications   acetaminophen 500 MG tablet Commonly known as:  TYLENOL Take 500 mg by mouth every 6 (six) hours as needed.   alendronate 70 MG tablet Commonly known as:  FOSAMAX Take 70 mg by mouth once a week. Take with a full glass of water on an empty stomach.   aspirin 81 MG chewable tablet Chew 81 mg by mouth daily.   cholecalciferol 1000 units tablet Commonly known as:  VITAMIN D Take 1,000 Units by mouth daily.   docusate sodium 100 MG capsule Commonly known as:  COLACE Take 1 capsule (100 mg total) by mouth 2 (two) times daily.   enoxaparin 40 MG/0.4ML injection Commonly known as:  LOVENOX Inject 0.4 mLs (40 mg total) into the skin daily. Start taking on:  12/21/2016   Fluocinolone Acetonide 0.01 % Oil Place 1 application in ear(s) daily.   HYDROcodone-acetaminophen 5-325 MG tablet Commonly known as:  NORCO/VICODIN Take 1 tablet by mouth every 6 (six) hours as needed for moderate pain.   latanoprost 0.005 % ophthalmic solution Commonly known as:  XALATAN Place 1 drop into both eyes at bedtime.   LORazepam 1 MG tablet Commonly known as:  ATIVAN Take 1 tablet (1 mg total) by mouth every 8 (eight) hours as needed (agitation).   oseltamivir 30 MG capsule Commonly known as:  TAMIFLU Take 1 capsule (30 mg total) by mouth 2 (two) times daily.  DISCHARGE INSTRUCTIONS:  See AVS.  If you experience worsening of your admission symptoms, develop shortness of breath, life threatening emergency, suicidal or homicidal thoughts you must seek medical attention immediately by calling 911 or calling your MD immediately  if symptoms less severe.  You Must read complete instructions/literature along with all the possible adverse reactions/side effects for all  the Medicines you take and that have been prescribed to you. Take any new Medicines after you have completely understood and accpet all the possible adverse reactions/side effects.   Please note  You were cared for by a hospitalist during your hospital stay. If you have any questions about your discharge medications or the care you received while you were in the hospital after you are discharged, you can call the unit and asked to speak with the hospitalist on call if the hospitalist that took care of you is not available. Once you are discharged, your primary care physician will handle any further medical issues. Please note that NO REFILLS for any discharge medications will be authorized once you are discharged, as it is imperative that you return to your primary care physician (or establish a relationship with a primary care physician if you do not have one) for your aftercare needs so that they can reassess your need for medications and monitor your lab values.    On the day of Discharge:  VITAL SIGNS:  Blood pressure (!) 93/53, pulse 67, temperature 98.2 F (36.8 C), temperature source Oral, resp. rate 16, height 5\' 5"  (1.651 m), weight 130 lb (59 kg), SpO2 98 %. PHYSICAL EXAMINATION:  GENERAL:  83 y.o.-year-old patient lying in the bed with no acute distress. Malnutrition. EYES: Pupils equal, round, reactive to light and accommodation. No scleral icterus. Extraocular muscles intact.  HEENT: Head atraumatic, normocephalic. Oropharynx and nasopharynx clear.  NECK:  Supple, no jugular venous distention. No thyroid enlargement, no tenderness.  LUNGS: Normal breath sounds bilaterally, no wheezing, rales,rhonchi or crepitation. No use of accessory muscles of respiration.  CARDIOVASCULAR: S1, S2 normal. No murmurs, rubs, or gallops.  ABDOMEN: Soft, non-tender, non-distended. Bowel sounds present. No organomegaly or mass.  EXTREMITIES: No pedal edema, cyanosis, or clubbing.  NEUROLOGIC: Cranial  nerves II through XII are intact.  PSYCHIATRIC: The patient is Demented. SKIN: No obvious rash, lesion, or ulcer.  DATA REVIEW:   CBC  Recent Labs Lab 12/20/16 0353  WBC 5.2  HGB 11.0*  HCT 32.4*  PLT 89*    Chemistries   Recent Labs Lab 12/19/16 1041 12/20/16 0353  NA 138 141  K 3.5 3.7  CL 102 109  CO2 29 27  GLUCOSE 102* 125*  BUN 18 16  CREATININE 0.81 0.67  CALCIUM 10.2 9.3  AST 40  --   ALT 23  --   ALKPHOS 50  --   BILITOT 0.9  --      Microbiology Results  Results for orders placed or performed during the hospital encounter of 12/19/16  Culture, blood (Routine x 2)     Status: None (Preliminary result)   Collection Time: 12/19/16 10:43 AM  Result Value Ref Range Status   Specimen Description BLOOD R HAND  Final   Special Requests   Final    BOTTLES DRAWN AEROBIC AND ANAEROBIC AER ANA   Culture NO GROWTH < 24 HOURS  Final   Report Status PENDING  Incomplete  Culture, blood (Routine x 2)     Status: None (Preliminary result)   Collection Time: 12/19/16 10:43  AM  Result Value Ref Range Status   Specimen Description BLOOD L FA  Final   Special Requests   Final    BOTTLES DRAWN AEROBIC AND ANAEROBIC AER ANA   Culture NO GROWTH < 24 HOURS  Final   Report Status PENDING  Incomplete  Urine culture     Status: None   Collection Time: 12/19/16 11:34 AM  Result Value Ref Range Status   Specimen Description URINE, RANDOM  Final   Special Requests NONE  Final   Culture   Final    NO GROWTH Performed at [facility], [address] [place], [place] [address]    Report Status 12/20/2016 FINAL  Final    RADIOLOGY:  Dg Hip Operative Unilat W Or W/o Pelvis Right  Result Date: 12/19/2016 CLINICAL DATA:  ORIF proximal right femur fracture EXAM: OPERATIVE RIGHT HIP (WITH PELVIS IF PERFORMED) 4 VIEWS TECHNIQUE: Fluoroscopic spot image(s) were submitted for interpretation post-operatively. COMPARISON:  12/19/2016 right hip radiographs  FINDINGS: Fluoroscopy time 1 minutes 18 seconds. Four nondiagnostic spot fluoroscopic intraoperative radiographs demonstrate postsurgical changes from ORIF right intertrochanteric proximal femur fracture, which is transfixed by intramedullary rod with interlocking right femoral neck screw and interlocking distal screw. IMPRESSION: Intraoperative fluoroscopic guidance for ORIF intratrochanteric right proximal femur fracture . Electronically Signed   By: Delbert Phenix M.D.   On: 12/19/2016 18:27     Management plans discussed with the patient, family and they are in agreement.  CODE STATUS:     Code Status Orders        Start     Ordered   12/19/16 1900  Full code  Continuous     12/19/16 1859    Code Status History    Date Active Date Inactive Code Status Order ID Comments User Context   12/19/2016  6:59 PM  Full Code [id number]  Christena Flake, MD Inpatient      TOTAL TIME TAKING CARE OF THIS PATIENT: 37 minutes.    [name] M.D on 12/20/2016 at 3:45 PM  Between 7am to 6pm - Pager - [phone]  After 6pm go to [email or web address] - Scientist, research (life sciences) [facility]  Office  [phone]  CC: Primary care physician; [facility]   Note: This dictation was prepared with Dragon dictation along with smaller phrase technology. Any transcriptional errors that result from this process are unintentional.

## 2016-12-20 NOTE — Plan of Care (Signed)
Patient is TB positive quantiferon, however CXray was negative.

## 2016-12-20 NOTE — Evaluation (Signed)
Physical Therapy Evaluation Patient Details Name: Yesenia Leonard MRN: [id number] DOB: November 27, 1923 Today's Date: 12/20/2016   History of Present Illness  Pt is a 83 y/o female that presents after fall at home suffering a R hip fx, repaired via ORIF. History of dementia and osteoporosis.   Clinical Impression  Patient admitted after a fall at home resulting in R femur fx. She has a history of dementia and is confused throughout session, though this appears to be her baseline. She is able to provide good effort with exercises and bed mobility, however upon standing she has several near buckles on her RLE and reports she becomes faint, returned to sitting on bed at that time. She asked for water, which she began to choke on in semi-recumbent, improved with PT assisting with sitting. Given her significant need for physical assistance and decreased mobility, patient would benefit from short term rehab placement when she is medically appropriate for discharge.     Follow Up Recommendations SNF    Equipment Recommendations       Recommendations for Other Services       Precautions / Restrictions Precautions Precautions: Fall Restrictions Weight Bearing Restrictions: Yes RLE Weight Bearing: Weight bearing as tolerated      Mobility  Bed Mobility Overal bed mobility: Needs Assistance Bed Mobility: Supine to Sit;Sit to Supine     Supine to sit: Mod assist Sit to supine: Max assist   General bed mobility comments: Patient unable to follow cuing, difficulty with trunk control and LE strength to safely manage transfers.   Transfers Overall transfer level: Needs assistance Equipment used: Rolling walker (2 wheeled) Transfers: Sit to/from Stand Sit to Stand: Min assist;Mod assist         General transfer comment: Patient performs transfer surprisingly well with therapist cuing for technique and hand placement.   Ambulation/Gait Ambulation/Gait assistance: Min assist;Mod  assist Ambulation Distance (Feet): 2 Feet Assistive device: Rolling walker (2 wheeled)       General Gait Details: Patient takes several lateral steps to her L then informs therapist she is about to faint and was returned to sitting.   Stairs            Wheelchair Mobility    Modified Rankin (Stroke Patients Only)       Balance Overall balance assessment: Needs assistance Sitting-balance support: Bilateral upper extremity supported Sitting balance-Leahy Scale: Fair   Postural control: Left lateral lean Standing balance support: Bilateral upper extremity supported Standing balance-Leahy Scale: Poor Standing balance comment: Multiple near buckles on her RLE.                              Pertinent Vitals/Pain Pain Assessment: Faces Faces Pain Scale: Hurts even more Pain Location: R hip when standing.  Pain Descriptors / Indicators: Aching;Operative site guarding Pain Intervention(s): Limited activity within patient's tolerance;Monitored during session;Repositioned    Home Living Family/patient expects to be discharged to:: Skilled nursing facility Living Arrangements: Children (Daughter)                    Prior Function Level of Independence: Needs assistance         Comments: Patient is unable to provide accurate history and no family is present to provide further information.      Hand Dominance        Extremity/Trunk Assessment   Upper Extremity Assessment Upper Extremity Assessment: Difficult to assess due to impaired cognition  Lower Extremity Assessment Lower Extremity Assessment:  (Unable to flex R hip/extend R knee against gravity without assistance)    Cervical / Trunk Assessment Cervical / Trunk Assessment: Kyphotic  Communication   Communication: No difficulties  Cognition Arousal/Alertness: Awake/alert Behavior During Therapy: WFL for tasks assessed/performed Overall Cognitive Status: History of cognitive  impairments - at baseline                 General Comments: oriented to self only    General Comments      Exercises General Exercises - Lower Extremity Ankle Circles/Pumps: AROM;10 reps;Both Long Arc Quad: AROM;Left;AAROM;Right;10 reps Heel Slides: AROM;Left;AAROM;Right;10 reps Hip ABduction/ADduction: AROM;Left;AAROM;Right;10 reps Straight Leg Raises: AROM;Left;AAROM;10 reps;Right Hip Flexion/Marching: AROM;Left;AAROM;Right;10 reps;Seated   Assessment/Plan    PT Assessment Patient needs continued PT services  PT Problem List Decreased strength;Decreased balance;Decreased knowledge of use of DME;Decreased safety awareness;Decreased mobility;Decreased activity tolerance;Pain          PT Treatment Interventions DME instruction;Gait training;Stair training;Functional mobility training;Therapeutic exercise;Therapeutic activities;Balance training;Neuromuscular re-education;Cognitive remediation;Patient/family education    PT Goals (Current goals can be found in the Care Plan section)  Acute Rehab PT Goals Patient Stated Goal: To move around more independerntly PT Goal Formulation: With patient Time For Goal Achievement: 01/03/17 Potential to Achieve Goals: Fair    Frequency BID   Barriers to discharge Decreased caregiver support      Co-evaluation               End of Session Equipment Utilized During Treatment: Gait belt Activity Tolerance: Patient tolerated treatment well Patient left: in bed;with bed alarm set;with call bell/phone within reach Nurse Communication: Mobility status         Time: 1010-1040 PT Time Calculation (min) (ACUTE ONLY): 30 min   Charges:   PT Evaluation $PT Eval Moderate Complexity: 1 Procedure PT Treatments $Therapeutic Exercise: 8-22 mins   PT G Codes:       Kerin RansomPatrick A McNamara, PT, DPT    12/20/2016, 12:00 PM

## 2016-12-20 NOTE — Clinical Social Work Placement (Signed)
   CLINICAL SOCIAL WORK PLACEMENT  NOTE  Date:  12/20/2016  Patient Details  Name: Yesenia Leonard MRN: [id number] Date of Birth: January 25, 1924  Clinical Social Work is seeking post-discharge placement for this patient at the Skilled  Nursing Facility level of care (*CSW will initial, date and re-position this form in  chart as items are completed):  Yes   Patient/family provided with [place] Clinical Social Work Department's list of facilities offering this level of care within the geographic area requested by the patient (or if unable, by the patient's family).  Yes   Patient/family informed of their freedom to choose among providers that offer the needed level of care, that participate in Medicare, Medicaid or managed care program needed by the patient, have an available bed and are willing to accept the patient.  Yes   Patient/family informed of [place]'s ownership interest in [facility] and [facility], as well as of the fact that they are under no obligation to receive care at these facilities.  PASRR submitted to EDS on 12/20/16     PASRR number received on 12/20/16     Existing PASRR number confirmed on       FL2 transmitted to all facilities in geographic area requested by pt/family on 12/20/16     FL2 transmitted to all facilities within larger geographic area on       Patient informed that his/her managed care company has contracts with or will negotiate with certain facilities, including the following:        Yes   Patient/family informed of bed offers received.  Patient [facility] bed at  [facility]     Physician recommends and patient chooses bed at      Patient to be transferred to   on  .  Patient to be transferred to facility by       Patient family notified on   of transfer.  Name of family member notified:        PHYSICIAN       Additional Comment:    _______________________________________________ Abcde Oneil, [name],  LCSW 12/20/2016, 2:08 PM

## 2016-12-20 NOTE — NC FL2 (Signed)
Yesenia Leonard MEDICAID FL2 LEVEL OF CARE SCREENING TOOL     IDENTIFICATION  Patient Name: Yesenia Leonard Birthdate: [birth year] Sex: female Admission Date (Current Location): 12/19/2016  [place]  Yesenia Leonard:  Yesenia Leonard, [address], [place], [place] [address]      Provider Number: [id number]  Attending Physician Name and Leonard:  Yesenia PollackQing Laketta Soderberg, MD  Relative Name and Phone Number:       Current Level of Care: Hospital Recommended Level of Care: Skilled Nursing Facility Prior Approval Number:    Date Approved/Denied:   PASRR Number:  [id number] )  Discharge Plan: SNF    Current Diagnoses: Patient Active Problem List   Diagnosis Date Noted  . Hip fracture (HCC) 12/19/2016  . Alzheimer's dementia 12/04/2016    Orientation RESPIRATION BLADDER Height & Weight     Self, Time, Situation, Place  O2 (2 Liters Oxygen ) Incontinent Weight: 130 lb (59 kg) Height:  5\' 5"  (165.1 cm)  BEHAVIORAL SYMPTOMS/MOOD NEUROLOGICAL BOWEL NUTRITION STATUS   (none)  (none) Continent Diet (Diet: Heart Healthy )  AMBULATORY STATUS COMMUNICATION OF NEEDS Skin   Extensive Assist Verbally Surgical wounds (Incision: Right Hip. )                       Personal Care Assistance Level of Assistance  Bathing, Feeding, Dressing Bathing Assistance: Limited assistance Feeding assistance: Independent Dressing Assistance: Limited assistance     Functional Limitations Info  Sight, Hearing, Speech Sight Info: Adequate Hearing Info: Adequate Speech Info: Adequate    SPECIAL CARE FACTORS FREQUENCY  PT (By licensed PT), OT (By licensed OT)     PT Frequency:  (5) OT Frequency:  (5)            Contractures      Additional Factors Info  Code Status, Allergies, Isolation Precautions Code Status Info:  (Full Code. ) Allergies Info:  (No Known Allergies. )     Isolation Precautions Info:  (Droplet precautions positive for  the flu)     Current Medications (12/20/2016):  This is the current hospital active medication list Current Facility-Administered Medications  Medication Dose Route Frequency Provider Last Rate Last Dose  . 0.9 % NaCl with KCl 20 mEq/ L  infusion   Intravenous Continuous Yesenia FlakeJohn J Poggi, MD 100 mL/hr at 12/19/16 2055    . acetaminophen (TYLENOL) tablet 650 mg  650 mg Oral Q6H PRN Yesenia BilberryShreyang Patel, MD       Or  . acetaminophen (TYLENOL) suppository 650 mg  650 mg Rectal Q6H PRN Yesenia BilberryShreyang Patel, MD      . acetaminophen (TYLENOL) tablet 1,000 mg  1,000 mg Oral Q6H Yesenia FlakeJohn J Poggi, MD   1,000 mg at 12/20/16 0500  . bisacodyl (DULCOLAX) suppository 10 mg  10 mg Rectal Daily PRN Yesenia FlakeJohn J Poggi, MD      . carvedilol (COREG) tablet 3.125 mg  3.125 mg Oral BID WC Yesenia PollackQing Kaylan Yates, MD      . ceFAZolin (ANCEF) IVPB 2 g/50 mL premix  2 g Intravenous Q6H Yesenia BilberryShreyang Patel, MD   2 g at 12/20/16 0500  . diphenhydrAMINE (BENADRYL) 12.5 MG/5ML elixir 12.5-25 mg  12.5-25 mg Oral Q4H PRN Yesenia FlakeJohn J Poggi, MD      . docusate sodium (COLACE) capsule 100 mg  100 mg Oral BID Yesenia FlakeJohn J Poggi, MD   100 mg at 12/20/16 0948  . enoxaparin (LOVENOX) injection 40 mg  40 mg  Subcutaneous Q24H Yesenia Flake, MD   40 mg at 12/20/16 0947  . Fluocinolone Acetonide 0.01 % OIL 1 application  1 application Otic Daily PRN Yesenia Bilberry, MD      . HYDROcodone-acetaminophen (NORCO/VICODIN) 5-325 MG per tablet 1-2 tablet  1-2 tablet Oral Q4H PRN Yesenia Bilberry, MD      . latanoprost (XALATAN) 0.005 % ophthalmic solution 1 drop  1 drop Both Eyes QHS Yesenia Bilberry, MD   1 drop at 12/19/16 2106  . levofloxacin (LEVAQUIN) tablet 250 mg  250 mg Oral [address] Yesenia Leonard      . LORazepam (ATIVAN) tablet 1 mg  1 mg Oral Q8H PRN Yesenia Bilberry, MD      . magnesium hydroxide (MILK OF MAGNESIA) suspension 30 mL  30 mL Oral Daily PRN Yesenia Flake, MD      . metoCLOPramide (REGLAN) tablet 5-10 mg  5-10 mg Oral Q8H PRN Yesenia Flake, MD       Or  . metoCLOPramide (REGLAN)  injection 5-10 mg  5-10 mg Intravenous Q8H PRN Yesenia Flake, MD      . morphine 2 MG/ML injection 1-2 mg  1-2 mg Intravenous Q2H PRN Yesenia Flake, MD      . ondansetron [facility]) tablet 4 mg  4 mg Oral Q6H PRN Yesenia Bilberry, MD       Or  . ondansetron (ZOFRAN) injection 4 mg  4 mg Intravenous Q6H PRN Yesenia Bilberry, MD      . oseltamivir (TAMIFLU) capsule 30 mg  30 mg Oral BID Yesenia Bilberry, MD   30 mg at 12/20/16 0948  . oxyCODONE (Oxy IR/ROXICODONE) immediate release tablet 5-10 mg  5-10 mg Oral Q3H PRN Yesenia Flake, MD   5 mg at 12/19/16 2055  . pantoprazole (PROTONIX) EC tablet 40 mg  40 mg Oral Daily Yesenia Flake, MD   40 mg at 12/20/16 0948  . sodium phosphate (FLEET) 7-19 GM/118ML enema 1 enema  1 enema Rectal Once PRN Yesenia Flake, MD         Discharge Medications: Please see discharge summary for a list of discharge medications.  Relevant Imaging Results:  Relevant Lab Results:   Additional Information  (SSN: [id number])  Sample, Yesenia Crocker, LCSW

## 2016-12-21 LAB — CBC
HCT: 23.8 % — ABNORMAL LOW (ref 35.0–47.0)
HCT: 26.4 % — ABNORMAL LOW (ref 35.0–47.0)
HEMOGLOBIN: 9 g/dL — AB (ref 12.0–16.0)
Hemoglobin: 7.9 g/dL — ABNORMAL LOW (ref 12.0–16.0)
MCH: 29.3 pg (ref 26.0–34.0)
MCH: 29.9 pg (ref 26.0–34.0)
MCHC: 33.3 g/dL (ref 32.0–36.0)
MCHC: 34.1 g/dL (ref 32.0–36.0)
MCV: 88 fL (ref 80.0–100.0)
MCV: 87.6 fL (ref 80.0–100.0)
Platelets: 96 10*3/uL — ABNORMAL LOW (ref 150–440)
Platelets: 94 10*3/uL — ABNORMAL LOW (ref 150–440)
RBC: 2.71 MIL/uL — ABNORMAL LOW (ref 3.80–5.20)
RBC: 3.01 MIL/uL — ABNORMAL LOW (ref 3.80–5.20)
RDW: 14.2 % (ref 11.5–14.5)
RDW: 13.7 % (ref 11.5–14.5)
WBC: 5.9 10*3/uL (ref 3.6–11.0)
WBC: 6.8 10*3/uL (ref 3.6–11.0)

## 2016-12-21 LAB — BASIC METABOLIC PANEL
Anion gap: 1 — ABNORMAL LOW (ref 5–15)
BUN: 18 mg/dL (ref 6–20)
CALCIUM: 9.5 mg/dL (ref 8.9–10.3)
CO2: 28 mmol/L (ref 22–32)
Chloride: 112 mmol/L — ABNORMAL HIGH (ref 101–111)
Creatinine, Ser: 0.74 mg/dL (ref 0.44–1.00)
GFR calc Af Amer: >60 mL/min (ref >60)
GLUCOSE: 89 mg/dL (ref 65–99)
POTASSIUM: 3.8 mmol/L (ref 3.5–5.1)
Sodium: 141 mmol/L (ref 135–145)

## 2016-12-21 LAB — HEMOGLOBIN: HEMOGLOBIN: 8.4 g/dL — AB (ref 12.0–16.0)

## 2016-12-21 LAB — ABO/RH: ABO/RH(D): O POS

## 2016-12-21 LAB — PREPARE RBC (CROSSMATCH)

## 2016-12-21 MED ORDER — SODIUM CHLORIDE 0.9 % IV SOLN
Freq: Once | INTRAVENOUS | Status: AC
  Administered 2016-12-21: 09:00:00 via INTRAVENOUS

## 2016-12-21 NOTE — Progress Notes (Signed)
Patient resting in bed, dressing intact on right hip. Patient confused calling out for sister and says her car is right there (pointing at information board in room. Patient reoriented and redirected, but is easily confused again. One unit prbc today, tolerated well. No complaints of pain at the moment. Continue to monitor.

## 2016-12-21 NOTE — Progress Notes (Addendum)
   Subjective: 2 Days Post-Op Procedure(s) (LRB): INTRAMEDULLARY (IM) NAIL INTERTROCHANTRIC (Right) Patient reports pain as mild.   Patient is well, and has had no acute complaints or problems Continue with physical  therapy today.  Plan is to go Rehab after hospital stay. no nausea and no vomiting Patient denies any chest pains or shortness of breath. Objective: Vital signs in last 24 hours: Temp:  [97.4 F (36.3 C)-98.4 F (36.9 C)] 98.4 F (36.9 C) (02/03 0411) Pulse Rate:  [64-84] 84 (02/03 0411) Resp:  [16] 16 (02/02 1438) BP: (85-116)/(46-60) 116/60 (02/03 0411) SpO2:  [94 %-98 %] 97 % (02/03 0411) well approximated incision Heels are non tender and elevated off the bed using rolled towels Intake/Output from previous day: 02/02 0701 - 02/03 0700 In: 1740 [P.O.:240; I.V.:1000; IV Piggyback:500] Out: -  Intake/Output this shift: No intake/output data recorded.   Recent Labs  12/19/16 1041 12/20/16 0353 12/21/16 0441  HGB 13.4 11.0* 7.9*    Recent Labs  12/20/16 0353 12/21/16 0441  WBC 5.2 5.9  RBC 3.69* 2.71*  HCT 32.4* 23.8*  PLT 89* 96*    Recent Labs  12/20/16 0353 12/21/16 0441  NA 141 141  K 3.7 3.8  CL 109 112*  CO2 27 28  BUN 16 18  CREATININE 0.67 0.74  GLUCOSE 125* 89  CALCIUM 9.3 9.5    Recent Labs  12/19/16 1444  INR 1.14    EXAM General - Patient is Alert, Appropriate and Oriented Extremity - Neurologically intact Neurovascular intact Sensation intact distally Intact pulses distally Dorsiflexion/Plantar flexion intact No cellulitis present Compartment soft Dressing - scant drainage Motor Function - intact, moving foot and toes well on exam.   Compression leg wraps on both legs  Past Medical History:  Diagnosis Date  . Dementia   . Hypercalcemia   . Hypertension   . Hypokalemia   . Osteoporosis   . PPD positive   . Pre-diabetes     Assessment/Plan: 2 Days Post-Op Procedure(s) (LRB): INTRAMEDULLARY (IM) NAIL  INTERTROCHANTRIC (Right) Active Problems:   Hip fracture (HCC)  Estimated body mass index is 21.63 kg/m as calculated from the following:   Height as of this encounter: 5\' 5"  (1.651 m).   Weight as of this encounter: 59 kg (130 lb). Up with therapy Discharge to SNF when medically cleared  Labs: reviewed DVT Prophylaxis - Lovenox, Foot Pumps and TED hose Weight-Bearing as tolerated to right leg Patient needs a bowel movement today [facility]. Sooner if any problems Staples to be removed 01/02/17 and apply benzoin and steri strips Continue lovenox for 14 days post op 40 mg Change dressing as needed.  Lynnda ShieldsJon R. [facility]fe PA [facility] 12/21/2016, 7:50 AM  Addendum: Some hypotension yesterday. Hgb today is 7.9. Discussed with Medicine. Will transfuse 1 unit pRBC this AM. Continue PT.  Illene LabradorJames P. Angie FavaHooten, Jr. M.D.

## 2016-12-21 NOTE — Progress Notes (Signed)
Physical Therapy Treatment Patient Details Name: Yesenia Leonard MRN: [id number] DOB: [birth year] Today's Date: 12/21/2016    History of Present Illness Pt is a 83 y/o female that presents after fall at home suffering a R hip fx, repaired via ORIF. History of dementia and osteoporosis.     PT Comments    Patient demonstrates improved BP and tolerance for mobility in this session, however she continues to be symptomatic with standing orthostatics. She continues to have pain in R hip with there-ex and mobility, though she appears to be improving in her tolerance for this as well. Patient is progressing towards goals, though limited by orthostatics BP at this time. She continues to complain of dizziness while standing, and is only able to stand for brief period of time.   Follow Up Recommendations  SNF     Equipment Recommendations       Recommendations for Other Services       Precautions / Restrictions Precautions Precautions: Fall Restrictions Weight Bearing Restrictions: Yes RLE Weight Bearing: Weight bearing as tolerated    Mobility  Bed Mobility Overal bed mobility: Needs Assistance Bed Mobility: Supine to Sit;Sit to Supine     Supine to sit: Mod assist;Max assist Sit to supine: Max assist   General bed mobility comments: Patient is able to assist with trunk, however requires extensive assistance for transfer from sit to supine secondary to LE weakness.   Transfers Overall transfer level: Needs assistance Equipment used: Rolling walker (2 wheeled) Transfers: Sit to/from Stand Sit to Stand: Mod assist         General transfer comment: Patient completed transfer, however reports dizziness upon initially standing.   Ambulation/Gait             General Gait Details: Given her orthostatic complaints, deferred gait.    Stairs            Wheelchair Mobility    Modified Rankin (Stroke Patients Only)       Balance Overall balance assessment: Needs  assistance Sitting-balance support: Bilateral upper extremity supported;Feet supported Sitting balance-Leahy Scale: Fair     Standing balance support: Bilateral upper extremity supported Standing balance-Leahy Scale: Poor                      Cognition Arousal/Alertness: Awake/alert Behavior During Therapy: WFL for tasks assessed/performed Overall Cognitive Status: History of cognitive impairments - at baseline                      Exercises General Exercises - Lower Extremity Long Arc Quad: AROM;Left;AAROM;Right;20 reps Heel Slides: AROM;Left;AAROM;Right;15 reps Hip ABduction/ADduction: AROM;Left;AAROM;Right;10 reps Straight Leg Raises: AROM;Left;AAROM;Right;10 reps Hip Flexion/Marching: AROM;Left;AAROM;Right;10 reps;Seated    General Comments        Pertinent Vitals/Pain Pain Assessment: Faces Faces Pain Scale: Hurts whole lot Pain Location: R hip Pain Descriptors / Indicators: Aching;Operative site guarding Pain Intervention(s): Limited activity within patient's tolerance;Monitored during session;Repositioned    Home Living                      Prior Function            PT Goals (current goals can now be found in the care plan section) Acute Rehab PT Goals Patient Stated Goal: To move around more independerntly PT Goal Formulation: With patient Time For Goal Achievement: 01/03/17 Potential to Achieve Goals: Fair Progress towards PT goals: Progressing toward goals    Frequency    BID  PT Plan Current plan remains appropriate    Co-evaluation             End of Session Equipment Utilized During Treatment: Gait belt Activity Tolerance: Treatment limited secondary to medical complications (Comment);Patient tolerated treatment well (Continues to have symptomatic orthostatics) Patient left: in bed;with bed alarm set;with call bell/phone within reach     Time: 1610-9604 PT Time Calculation (min) (ACUTE ONLY): 24  min  Charges:  $Gait Training: 8-22 mins $Therapeutic Exercise: 8-22 mins                    G Codes:      Yesenia Leonard January 13, 2017, 4:46 PM

## 2016-12-21 NOTE — Progress Notes (Signed)
[employer] - [place] [facility]   PATIENT NAME: Yesenia LukesDoris Schnitzler    MR#:  [id number]  DATE OF BIRTH:  [birth year]  SUBJECTIVE:  CHIEF COMPLAINT:   Chief Complaint  Patient presents with  . Fall   The patient denies any pain, blood pressure has improved with IV fluid administration, but hemoglobin level has declined to 7.9. The patient was transfused 1 unit of packed red blood cells after which he probably level improved to 8.4.  REVIEW OF SYSTEMS:  Review of Systems  Unable to perform ROS: Dementia    DRUG ALLERGIES:  No Known Allergies VITALS:  Blood pressure (!) 122/58, pulse 86, temperature 98.2 F (36.8 C), temperature source Oral, resp. rate 18, height 5\' 5"  (1.651 m), weight 59 kg (130 lb), SpO2 96 %. PHYSICAL EXAMINATION:  Physical Exam  Constitutional:  Malnutrition.  HENT:  Head: Normocephalic and atraumatic.  Eyes: Conjunctivae and EOM are normal.  Neck: Normal range of motion. Neck supple. No JVD present. No tracheal deviation present.  Cardiovascular: Normal rate, regular rhythm and normal heart sounds.   Pulmonary/Chest: Effort normal and breath sounds normal. No respiratory distress. She has no wheezes. She has no rales.  Abdominal: Soft. Bowel sounds are normal. She exhibits no distension. There is no tenderness.  Musculoskeletal: She exhibits no edema.  Neurological: No cranial nerve deficit.  Skin: No rash noted. No erythema.  Psychiatric:  Demented   LABORATORY PANEL:   CBC  Recent Labs Lab 12/21/16 0441 12/21/16 0907  WBC 5.9  --   HGB 7.9* 8.4*  HCT 23.8*  --   PLT 96*  --    ------------------------------------------------------------------------------------------------------------------ Chemistries   Recent Labs Lab 12/19/16 1041  12/21/16 0441  NA 138  < > 141  K 3.5  < > 3.8  CL 102  < > 112*  CO2 29  < > 28  GLUCOSE 102*  < > 89  BUN 18  < > 18  CREATININE 0.81  < > 0.74  CALCIUM 10.2  < > 9.5  AST 40   --   --   ALT 23  --   --   ALKPHOS 50  --   --   BILITOT 0.9  --   --   < > = values in this interval not displayed. RADIOLOGY:  No results found. ASSESSMENT AND PLAN:   Patient is a 83 year old presenting after a fall noted to have right hip fracture  1. Moderately displaced intertrochanteric proximal right femoral Fracture. S/p Reduction and internal fixation of two-part intertrochanteric right hip fracture with [employer] TFN nail. POD2. Pain control and DVT prophylaxis. He'll nursing facility as recommended by physical therapist, where patient will likely be discharged tomorrow or on Monday  2. Influenza A Continue Tamiflu , following fever curve are  * Hypotension. Hold hypertension medication, improved blood pressure with IV fluids, the patient is going to be transfused packed red blood cells, follow blood pressure readings closely and initiate blood pressure medications if needed  3. Essential hypertension. hold amlodipine and Coreg and lisinopril due to hypotension.  4. Glaucoma continue eyedrops  5. Anxiety continue Ativan when necessary  6. Misc: lovenox post surgery if ok with ortho  7. + ppd neg cxr, sw consult will need snf for rehab  8 Thrombocytopenia, likely consumption, but he lives have improved to 96 .  following CBC.  9. Acute posthemorrhagic, postoperative anemia, transfusing packed red blood cells, follow hemoglobin level tomorrow morning, continue Lovenox  for now   All the records are reviewed and case discussed with Care Management/Social Worker. Management plans discussed with the patient, family and they are in agreement.  CODE STATUS: full code.  TOTAL TIME TAKING CARE OF THIS PATIENT: 35 minutes.     POSSIBLE D/C IN 2 DAYS, DEPENDING ON CLINICAL CONDITION.   [name] M.D on 12/21/2016 at 1:14 PM  Between 7am to 6pm - Pager - [phone]  After 6pm go to [email or web address] - Scientist, research (life sciences) [facility]  Office  [phone]  CC: Primary care physician; [facility]  Note: This dictation was prepared with Dragon dictation along with smaller phrase technology. Any transcriptional errors that result from this process are unintentional.

## 2016-12-21 NOTE — Progress Notes (Signed)
Physical Therapy Treatment Patient Details Name: Yesenia Leonard MRN: [id number] DOB: [birth year] Today's Date: 12/21/2016    History of Present Illness Pt is a 83 y/o female that presents after fall at home suffering a R hip fx, repaired via ORIF. History of dementia and osteoporosis.     PT Comments    Patient is currently being transfused, given her low BP and symptomatic orthostatics yesterday, will defer any OOB mobility at this time. She was more cognitively alert and oriented today and able to recognize this author from previous session yesterday. She was able to provide good effort with brief there-ex session, though unable to perform isometric contractions due to poor command following.   Follow Up Recommendations  SNF     Equipment Recommendations       Recommendations for Other Services       Precautions / Restrictions Precautions Precautions: Fall Restrictions Weight Bearing Restrictions: Yes RLE Weight Bearing: Weight bearing as tolerated    Mobility  Bed Mobility               General bed mobility comments: Deferred mobility as patient is currently receiving blood transfusion  Transfers                    Ambulation/Gait                 Stairs            Wheelchair Mobility    Modified Rankin (Stroke Patients Only)       Balance                                    Cognition Arousal/Alertness: Awake/alert Behavior During Therapy: WFL for tasks assessed/performed Overall Cognitive Status: History of cognitive impairments - at baseline                      Exercises General Exercises - Lower Extremity Ankle Circles/Pumps: AROM;20 reps;Both Quad Sets:  (Patient unable to follow directions to complete) Heel Slides: AROM;Left;AAROM;Right;10 reps Hip ABduction/ADduction: AROM;Left;AAROM;Right;10 reps Straight Leg Raises: AROM;Left;AAROM;Right;10 reps    General Comments        Pertinent  Vitals/Pain Pain Assessment: Faces Faces Pain Scale: No hurt    Home Living                      Prior Function            PT Goals (current goals can now be found in the care plan section) Acute Rehab PT Goals Patient Stated Goal: To move around more independerntly PT Goal Formulation: With patient Time For Goal Achievement: 01/03/17 Potential to Achieve Goals: Fair Progress towards PT goals: Not progressing toward goals - comment (Unable to progress this date due to blood transfusion)    Frequency    BID      PT Plan Current plan remains appropriate    Co-evaluation             End of Session Equipment Utilized During Treatment: Oxygen Activity Tolerance: Treatment limited secondary to medical complications (Comment) (Currently receiving blood transfusion) Patient left: in bed;with bed alarm set;with call bell/phone within reach     Time: 1014-1022 PT Time Calculation (min) (ACUTE ONLY): 8 min  Charges:  $Therapeutic Exercise: 8-22 mins  G Codes:      Kerin RansomPatrick A Shervin Cypert, PT, DPT    12/21/2016, 10:37 AM

## 2016-12-22 DIAGNOSIS — I959 Hypotension, unspecified: Secondary | ICD-10-CM

## 2016-12-22 DIAGNOSIS — Z9289 Personal history of other medical treatment: Secondary | ICD-10-CM

## 2016-12-22 DIAGNOSIS — R42 Dizziness and giddiness: Secondary | ICD-10-CM

## 2016-12-22 DIAGNOSIS — Z9889 Other specified postprocedural states: Secondary | ICD-10-CM

## 2016-12-22 DIAGNOSIS — Z8781 Personal history of (healed) traumatic fracture: Secondary | ICD-10-CM

## 2016-12-22 DIAGNOSIS — D62 Acute posthemorrhagic anemia: Secondary | ICD-10-CM

## 2016-12-22 DIAGNOSIS — I951 Orthostatic hypotension: Secondary | ICD-10-CM

## 2016-12-22 DIAGNOSIS — D696 Thrombocytopenia, unspecified: Secondary | ICD-10-CM

## 2016-12-22 LAB — HEMOGLOBIN AND HEMATOCRIT, BLOOD
HCT: 23.8 % — ABNORMAL LOW (ref 35.0–47.0)
HEMATOCRIT: 25.4 % — AB (ref 35.0–47.0)
Hemoglobin: 8.7 g/dL — ABNORMAL LOW (ref 12.0–16.0)
Hemoglobin: 8.1 g/dL — ABNORMAL LOW (ref 12.0–16.0)

## 2016-12-22 LAB — BASIC METABOLIC PANEL
ANION GAP: 5 (ref 5–15)
BUN: 9 mg/dL (ref 6–20)
CHLORIDE: 109 mmol/L (ref 101–111)
CO2: 27 mmol/L (ref 22–32)
Calcium: 9.3 mg/dL (ref 8.9–10.3)
Creatinine, Ser: 0.45 mg/dL (ref 0.44–1.00)
GFR calc Af Amer: >60 mL/min (ref >60)
GFR calc non Af Amer: >60 mL/min (ref >60)
GLUCOSE: 84 mg/dL (ref 65–99)
POTASSIUM: 3.4 mmol/L — AB (ref 3.5–5.1)
Sodium: 141 mmol/L (ref 135–145)

## 2016-12-22 LAB — TYPE AND SCREEN
ABO/RH(D): O POS
Antibody Screen: NEGATIVE
Unit division: 0

## 2016-12-22 LAB — HEMOGLOBIN: Hemoglobin: 8.4 g/dL — ABNORMAL LOW (ref 12.0–16.0)

## 2016-12-22 MED ORDER — SODIUM CHLORIDE 0.9 % IV BOLUS (SEPSIS)
500.0000 mL | INTRAVENOUS | Status: AC
  Administered 2016-12-22: 500 mL via INTRAVENOUS

## 2016-12-22 MED ORDER — FERROUS SULFATE 325 (65 FE) MG PO TABS: 325.0000 mg | ORAL_TABLET | Freq: Three times a day (TID) | ORAL | 0 refills | Status: AC

## 2016-12-22 MED ORDER — SODIUM CHLORIDE 0.9 % IV BOLUS (SEPSIS)
1000.0000 mL | INTRAVENOUS | Status: AC
  Administered 2016-12-22: 1000 mL via INTRAVENOUS

## 2016-12-22 NOTE — Progress Notes (Signed)
Physical Therapy Treatment Patient Details Name: Yesenia Leonard MRN: [id number] DOB: [birth year] Today's Date: 12/22/2016    History of Present Illness Pt is a 83 y/o female that presents after fall at home suffering a R hip fx, repaired via ORIF. History of dementia and osteoporosis.     PT Comments    Patient continues to be somewhat lethargic and symptomatic with standing. She was able to participate in all there-ex in this session, however once she was in standing she became less and less responsive. Even after sitting she began losing consciousness and became less responsive. At this time, PT called for RN, rapid response called. Patient became more responsive once in supine, BP was WNL. Deferred any further mobility given her response/tolerance this date.   Follow Up Recommendations  SNF     Equipment Recommendations       Recommendations for Other Services       Precautions / Restrictions Precautions Precautions: Fall Restrictions Weight Bearing Restrictions: Yes RLE Weight Bearing: Weight bearing as tolerated    Mobility  Bed Mobility Overal bed mobility: Needs Assistance Bed Mobility: Supine to Sit;Sit to Supine     Supine to sit: Mod assist;Max assist Sit to supine: Max assist   General bed mobility comments: Patient continues to have difficulty with active movement of LEs secondary to pain/weakness.   Transfers Overall transfer level: Needs assistance Equipment used: Rolling walker (2 wheeled) Transfers: Sit to/from Stand Sit to Stand: Mod assist         General transfer comment: Patient completed transfer denies dizziness with standing initially, but becomes less and less responsive after 1-2 minutes standing.   Ambulation/Gait             General Gait Details: Given her orthostatic complaints, deferred gait.    Stairs            Wheelchair Mobility    Modified Rankin (Stroke Patients Only)       Balance Overall balance  assessment: Needs assistance Sitting-balance support: Bilateral upper extremity supported;Feet supported Sitting balance-Leahy Scale: Fair     Standing balance support: Bilateral upper extremity supported Standing balance-Leahy Scale: Poor                      Cognition                            Exercises General Exercises - Lower Extremity Ankle Circles/Pumps: AROM;10 reps;Both Long Arc Quad: AROM;Left;AAROM;Right;10 reps Heel Slides: AROM;Left;AAROM;Right;10 reps Hip ABduction/ADduction: AROM;Left;AAROM;Right;10 reps Straight Leg Raises: AROM;Left;AAROM;10 reps;Right Hip Flexion/Marching: AROM;Left;AAROM;Right;10 reps;Seated    General Comments        Pertinent Vitals/Pain Pain Assessment: Faces Faces Pain Scale: Hurts even more Pain Location: R hip Pain Descriptors / Indicators: Aching;Operative site guarding Pain Intervention(s): Limited activity within patient's tolerance;Monitored during session    Home Living                      Prior Function            PT Goals (current goals can now be found in the care plan section) Acute Rehab PT Goals Patient Stated Goal: To move around more independerntly PT Goal Formulation: With patient Time For Goal Achievement: 01/03/17 Potential to Achieve Goals: Fair Progress towards PT goals: Progressing toward goals    Frequency    BID      PT Plan Current plan remains appropriate  Co-evaluation             End of Session Equipment Utilized During Treatment: Gait belt Activity Tolerance: Treatment limited secondary to medical complications (Comment);Patient tolerated treatment well (Continues to have symptomatic orthostatics) Patient left: in bed;with bed alarm set;with call bell/phone within reach     Time: 0850-0925 PT Time Calculation (min) (ACUTE ONLY): 35 min  Charges:  $Gait Training: 8-22 mins $Therapeutic Exercise: 8-22 mins                    G Codes:       Kerin RansomPatrick A Lissandro Dilorenzo, PT, DPT    12/22/2016, 1:36 PM

## 2016-12-22 NOTE — Progress Notes (Signed)
Patient was working with PT, and became dizzy and lethargic. Rapid response called. Patient was sitting on side of bed, assisted to lay back down in bed. VSS. Patient more alert now and verbal. States she feels better but did feel really dizzy earlier. RT and AC came, no need for interventions. PT states she has been having these symptoms every time she tries to work with PT by standing up. Currently stable. Continue to monitor.

## 2016-12-22 NOTE — Progress Notes (Signed)
   Subjective: 3 Days Post-Op Procedure(s) (LRB): INTRAMEDULLARY (IM) NAIL INTERTROCHANTRIC (Right) Patient reports pain as mild.   Patient is well, and has had no acute complaints or problems Continue with physical therapy today.  Plan is to go Rehab after hospital stay. no nausea and no vomiting Patient denies any chest pains or shortness of breath. Patient eating breakfast this morning. Patient was placed and resting well.  Objective: Vital signs in last 24 hours: Temp:  [98.2 F (36.8 C)-99.9 F (37.7 C)] 98.2 F (36.8 C) (02/04 0756) Pulse Rate:  [80-95] 95 (02/04 0756) Resp:  [17-19] 19 (02/04 0756) BP: (92-146)/(46-69) 146/68 (02/04 0756) SpO2:  [92 %-97 %] 95 % (02/04 0756) well approximated incision Compression leg wraps in place Intake/Output from previous day: 02/03 0701 - 02/04 0700 In: 3883.3 [I.V.:3583.3; Blood:300] Out: -  Intake/Output this shift: No intake/output data recorded.   Recent Labs  12/19/16 1041 12/20/16 0353 12/21/16 0441 12/21/16 0907 12/21/16 1305  HGB 13.4 11.0* 7.9* 8.4* 9.0*    Recent Labs  12/21/16 0441 12/21/16 1305  WBC 5.9 6.8  RBC 2.71* 3.01*  HCT 23.8* 26.4*  PLT 96* 94*    Recent Labs  12/21/16 0441 12/22/16 0601  NA 141 141  K 3.8 3.4*  CL 112* 109  CO2 28 27  BUN 18 9  CREATININE 0.74 0.45  GLUCOSE 89 84  CALCIUM 9.5 9.3    Recent Labs  12/19/16 1444  INR 1.14    EXAM General - Patient is Alert, Appropriate and Oriented Extremity - Neurologically intact Neurovascular intact Sensation intact distally Intact pulses distally Dorsiflexion/Plantar flexion intact No cellulitis present Compartment soft Dressing - scant drainage Motor Function - intact, moving foot and toes well on exam.    Past Medical History:  Diagnosis Date  . Dementia   . Hypercalcemia   . Hypertension   . Hypokalemia   . Osteoporosis   . PPD positive   . Pre-diabetes     Assessment/Plan: 3 Days Post-Op  Procedure(s) (LRB): INTRAMEDULLARY (IM) NAIL INTERTROCHANTRIC (Right) Active Problems:   Hip fracture (HCC)  Estimated body mass index is 21.63 kg/m as calculated from the following:   Height as of this encounter: 5\' 5"  (1.651 m).   Weight as of this encounter: 59 kg (130 lb). Up with therapy Discharge to SNF  Labs: Hemoglobin 9.0 DVT Prophylaxis - Lovenox, Foot Pumps and TED hose Weight-Bearing as tolerated to right leg Patient needs a bowel movement today [facility]. Sooner if any problems Staples to be removed 01/02/17 and apply benzoin and steri strips Continue lovenox for 14 days post op 40 mg Change dressing as needed. Elevate heels off bed with rolled towels.  D/C O2 and Pulse OX and try on Room [employer]  Hattye Siegfried R. [facility] PA [facility] 12/22/2016, 8:24 AM

## 2016-12-22 NOTE — Discharge Summary (Addendum)
[employer] [facility] - [place] [facility]   PATIENT NAME: Yesenia Leonard    MR#:  [id number]  DATE OF BIRTH:  [birth year]  DATE OF ADMISSION:  12/19/2016   ADMITTING PHYSICIAN: Auburn Bilberry, MD  DATE OF DISCHARGE: 12/22/2016 PRIMARY CARE PHYSICIAN: [facility]   ADMISSION DIAGNOSIS:  Surgery, elective [Z41.9] Fall, initial encounter [W19.XXXA] Closed fracture of right hip, initial encounter (HCC) [S72.001A] DISCHARGE DIAGNOSIS:  Principal Problem:   Hip fracture (HCC) Active Problems:   S/P ORIF (open reduction internal fixation) fracture   Hypotension   Acute posthemorrhagic anemia   H/O transfusion of packed red blood cells   Dizziness   Orthostatic hypotension   Thrombocytopenia (HCC) Influenza A Hypotension SECONDARY DIAGNOSIS:   Past Medical History:  Diagnosis Date  . Dementia   . Hypercalcemia   . Hypertension   . Hypokalemia   . Osteoporosis   . PPD positive   . Pre-diabetes    HOSPITAL COURSE:  Patient is a 83 year old presenting after a fall noted to have right hip fracture  1. Moderately displaced intertrochanteric proximal right femoral Fracture. S/p Reduction and internal fixation of two-part intertrochanteric righthip fracture with [employer] Affix[employer] TFN nail. POD3. Pain control Is adequate and DVT prophylaxis to be continued for 14 days total.   2. Influenza A . Continue Tamiflu to complete course  3 Hypotension. Holding antihypertensive medications. The patient's blood pressure has improved with transfusion and IV fluids. Now, off IV fluids, patient continues to have intermittent orthostatic hypotension and dizziness with ambulation. Getting small IV fluid. The fluid bolus, rechecking orthostatic vital signs. It is recommended to follow patient's oral intake, especially fluid intake and intermittently blood pressure readings while standing and laying down. Hemoglobin level will be rechecked after bolus. Patient may be  discharged to skilled nursing facility if hemoglobin remains stable..  4. Essential hypertension.hold amlodipine and  lisinopril due to hypotension. Resuming Coreg  5. Glaucoma continue eyedrops  6. Anxiety continue Ativan when necessary  7. Misc: lovenox post surgery for 14 days  8. + ppd neg cxr, sw involved for snf for rehab  9 Thrombocytopenia. Platelets improved to 94, likely consumption, it is recommended to follow platelet count as outpatient.  10 Malnutrition. Dietitian consult as outpatient.  11. Acute posthemorrhagic anemia, status post packed red blood cell transfusion, hemoglobin level improved from 7.9-8.4, patient will be receiving IV fluid bolus and her hemoglobin level will be rechecked. Afterwards, patient will be likely discharged to skilled nursing facility for rehabilitation. If hemoglobin level remains stable. It is recommended to continue iron supplementation.  12. Orthostatic hypotension, patient was given 1-1/2 L today due to mild orthostatic hypotension as well as dizziness standing up with physical therapy, her hemoglobin level was rechecked few times and it remained stable. It is recommended to follow patient's orthostatic blood pressure readings, allow patient to sit on the bed for some period of time before standing up for physical therapy or any other activity, she may need to be initiated on midodrine or even fludrocortisone to control her orthostatic hypotension symptoms as outpatient. It is recommended to follow patient's hemoglobin level closely to ensure that the patient does not bleed within the surgical wound with current Lovenox dose.  DISCHARGE CONDITIONS:  The patient will be discharged to skilled nursing facility on Sunday  CONSULTS OBTAINED:  Treatment Team:  Christena Flake, MD DRUG ALLERGIES:  No Known Allergies DISCHARGE MEDICATIONS:   Allergies as of 12/22/2016   No Known Allergies  Medication List    STOP taking these medications     amLODipine 10 MG tablet Commonly known as:  NORVASC   lisinopril 20 MG tablet Commonly known as:  PRINIVIL,ZESTRIL     TAKE these medications   acetaminophen 500 MG tablet Commonly known as:  TYLENOL Take 500 mg by mouth every 6 (six) hours as needed.   alendronate 70 MG tablet Commonly known as:  FOSAMAX Take 70 mg by mouth once a week. Take with a full glass of water on an empty stomach.   aspirin 81 MG chewable tablet Chew 81 mg by mouth daily.   carvedilol 3.125 MG tablet Commonly known as:  COREG Take 3.125 mg by mouth 2 (two) times daily with a meal.   cholecalciferol 1000 units tablet Commonly known as:  VITAMIN D Take 1,000 Units by mouth daily.   docusate sodium 100 MG capsule Commonly known as:  COLACE Take 1 capsule (100 mg total) by mouth 2 (two) times daily.   enoxaparin 40 MG/0.4ML injection Commonly known as:  LOVENOX Inject 0.4 mLs (40 mg total) into the skin daily.   ferrous sulfate 325 (65 FE) MG tablet Take 1 tablet (325 mg total) by mouth 3 (three) times daily with meals.   Fluocinolone Acetonide 0.01 % Oil Place 1 application in ear(s) daily.   HYDROcodone-acetaminophen 5-325 MG tablet Commonly known as:  NORCO/VICODIN Take 1 tablet by mouth every 6 (six) hours as needed for moderate pain.   latanoprost 0.005 % ophthalmic solution Commonly known as:  XALATAN Place 1 drop into both eyes at bedtime.   LORazepam 1 MG tablet Commonly known as:  ATIVAN Take 1 tablet (1 mg total) by mouth every 8 (eight) hours as needed (agitation).   oseltamivir 30 MG capsule Commonly known as:  TAMIFLU Take 1 capsule (30 mg total) by mouth 2 (two) times daily.        DISCHARGE INSTRUCTIONS:  See AVS.  If you experience worsening of your admission symptoms, develop shortness of breath, life threatening emergency, suicidal or homicidal thoughts you must seek medical attention immediately by calling 911 or calling your MD immediately  if symptoms less  severe.  You Must read complete instructions/literature along with all the possible adverse reactions/side effects for all the Medicines you take and that have been prescribed to you. Take any new Medicines after you have completely understood and accpet all the possible adverse reactions/side effects.   Please note  You were cared for by a hospitalist during your hospital stay. If you have any questions about your discharge medications or the care you received while you were in the hospital after you are discharged, you can call the unit and asked to speak with the hospitalist on call if the hospitalist that took care of you is not available. Once you are discharged, your primary care physician will handle any further medical issues. Please note that NO REFILLS for any discharge medications will be authorized once you are discharged, as it is imperative that you return to your primary care physician (or establish a relationship with a primary care physician if you do not have one) for your aftercare needs so that they can reassess your need for medications and monitor your lab values.    On the day of Discharge:  VITAL SIGNS:  Blood pressure 132/66, pulse 89, temperature 98.2 F (36.8 C), temperature source Oral, resp. rate 18, height 5\' 5"  (1.651 m), weight 59 kg (130 lb), SpO2 95 %. PHYSICAL EXAMINATION:  GENERAL:  83 y.o.-year-old patient lying in the bed with no acute distress. Malnutrition. EYES: Pupils equal, round, reactive to light and accommodation. No scleral icterus. Extraocular muscles intact.  HEENT: Head atraumatic, normocephalic. Oropharynx and nasopharynx clear.  NECK:  Supple, no jugular venous distention. No thyroid enlargement, no tenderness.  LUNGS: Normal breath sounds bilaterally, no wheezing, rales,rhonchi or crepitation. No use of accessory muscles of respiration.  CARDIOVASCULAR: S1, S2 normal. No murmurs, rubs, or gallops.  ABDOMEN: Soft, non-tender, non-distended.  Bowel sounds present. No organomegaly or mass.  EXTREMITIES: No pedal edema, cyanosis, or clubbing.  NEUROLOGIC: Cranial nerves II through XII are intact.  PSYCHIATRIC: The patient is Demented. SKIN: No obvious rash, lesion, or ulcer.  DATA REVIEW:   CBC  Recent Labs Lab 12/21/16 1305  12/22/16 1129  WBC 6.8  --   --   HGB 9.0*  < > 8.7*  HCT 26.4*  --  25.4*  PLT 94*  --   --   < > = values in this interval not displayed.  Chemistries   Recent Labs Lab 12/19/16 1041  12/22/16 0601  NA 138  < > 141  K 3.5  < > 3.4*  CL 102  < > 109  CO2 29  < > 27  GLUCOSE 102*  < > 84  BUN 18  < > 9  CREATININE 0.81  < > 0.45  CALCIUM 10.2  < > 9.3  AST 40  --   --   ALT 23  --   --   ALKPHOS 50  --   --   BILITOT 0.9  --   --   < > = values in this interval not displayed.   Microbiology Results  Results for orders placed or performed during the hospital encounter of 12/19/16  Culture, blood (Routine x 2)     Status: None (Preliminary result)   Collection Time: 12/19/16 10:43 AM  Result Value Ref Range Status   Specimen Description BLOOD R HAND  Final   Special Requests   Final    BOTTLES DRAWN AEROBIC AND ANAEROBIC AER 6ML ANA 8ML   Culture NO GROWTH 3 DAYS  Final   Report Status PENDING  Incomplete  Culture, blood (Routine x 2)     Status: None (Preliminary result)   Collection Time: 12/19/16 10:43 AM  Result Value Ref Range Status   Specimen Description BLOOD L FA  Final   Special Requests   Final    BOTTLES DRAWN AEROBIC AND ANAEROBIC AER 8ML ANA 7ML   Culture NO GROWTH 3 DAYS  Final   Report Status PENDING  Incomplete  Urine culture     Status: None   Collection Time: 12/19/16 11:34 AM  Result Value Ref Range Status   Specimen Description URINE, RANDOM  Final   Special Requests NONE  Final   Culture   Final    NO GROWTH Performed at [facility], [address] [place], [place] [address]    Report Status 12/20/2016 FINAL  Final    RADIOLOGY:  No  results found.   Management plans discussed with the patient, family and they are in agreement.  CODE STATUS:     Code Status Orders        Start     Ordered   12/19/16 1900  Full code  Continuous     12/19/16 1859    Code Status History    Date Active Date Inactive Code Status Order ID  Comments User Context   12/19/2016  6:59 PM  Full Code [id number]  Christena Flake, MD Inpatient      TOTAL TIME TAKING CARE OF THIS PATIENT: 40 minutes.    [name] M.D on 12/22/2016 at 11:48 AM  Between 7am to 6pm - Pager - [phone]  After 6pm go to [email or web address] - Scientist, research (life sciences) [facility]  Office  [phone]  CC: Primary care physician; [facility]   Note: This dictation was prepared with Dragon dictation along with smaller phrase technology. Any transcriptional errors that result from this process are unintentional.

## 2016-12-23 LAB — HEMOGLOBIN AND HEMATOCRIT, BLOOD
HCT: 27.2 % — ABNORMAL LOW (ref 35.0–47.0)
Hemoglobin: 9.2 g/dL — ABNORMAL LOW (ref 12.0–16.0)

## 2016-12-23 LAB — CBC
HEMATOCRIT: 23.8 % — AB (ref 35.0–47.0)
HEMOGLOBIN: 7.9 g/dL — AB (ref 12.0–16.0)
MCH: 29.5 pg (ref 26.0–34.0)
MCHC: 33.4 g/dL (ref 32.0–36.0)
MCV: 88.3 fL (ref 80.0–100.0)
Platelets: 123 10*3/uL — ABNORMAL LOW (ref 150–440)
RBC: 2.69 MIL/uL — AB (ref 3.80–5.20)
RDW: 13.7 % (ref 11.5–14.5)
WBC: 7.9 10*3/uL (ref 3.6–11.0)

## 2016-12-23 LAB — PREPARE RBC (CROSSMATCH)

## 2016-12-23 MED ORDER — HYDROCODONE-ACETAMINOPHEN 5-325 MG PO TABS: 1.0000 | ORAL_TABLET | Freq: Four times a day (QID) | ORAL | 0 refills | Status: DC | PRN

## 2016-12-23 MED ORDER — LORAZEPAM 1 MG PO TABS: 1.0000 mg | ORAL_TABLET | Freq: Three times a day (TID) | ORAL | 0 refills | Status: DC | PRN

## 2016-12-23 MED ORDER — SODIUM CHLORIDE 0.9 % IV SOLN: Freq: Once | INTRAVENOUS | Status: DC

## 2016-12-23 NOTE — Discharge Summary (Addendum)
[facility] [place] at [facility]   PATIENT NAME: Yesenia Leonard    MR#:  [id number]  DATE OF BIRTH:  01/04/1924  DATE OF ADMISSION:  12/19/2016 ADMITTING PHYSICIAN: Auburn Bilberry, MD  DATE OF DISCHARGE: 12/23/2016  PRIMARY CARE PHYSICIAN: [facility]     ADMISSION DIAGNOSIS:  Surgery, elective [Z41.9] Fall, initial encounter [W19.XXXA] Closed fracture of right hip, initial encounter (HCC) [S72.001A]  DISCHARGE DIAGNOSIS:  Principal Problem:   Hip fracture (HCC) Active Problems:   S/P ORIF (open reduction internal fixation) fracture   Hypotension   Acute posthemorrhagic anemia   H/O transfusion of packed red blood cells   Dizziness   Orthostatic hypotension   Thrombocytopenia (HCC)   SECONDARY DIAGNOSIS:   Past Medical History:  Diagnosis Date  . Dementia   . Hypercalcemia   . Hypertension   . Hypokalemia   . Osteoporosis   . PPD positive   . Pre-diabetes     .pro HOSPITAL COURSE:   The patient is a 83 year old Caucasian female with past medical history significant for history of dementia, hypertension, hypercholesterolemia, osteoporosis, who presents to the hospital after fall with right hip pain. Patient was not able to walk. X-ray of the leg revealed moderately displaced intertrochanteric hip fracture. Patient was admitted to the hospital for evaluation and treatment. She was seen by orthopedist surgeon and underwent intramedullary nail placement on February 1, postoperatively, patient did well, however, required IV fluid infusion due to orthostatic hypotension and acute posthemorrhagic anemia, she was transfused packed red blood cells with improvement of her hemoglobin level. She however was noted to have some dizziness and intermittent orthostatic hypotension. She was seen by physical therapist and recommended skilled nursing facility placement for rehabilitation when she will be discharged today. Of note she was also  diagnosed with influenza type A, for which she receives therapy Discussion by problem: 1. Moderately displaced intertrochanteric proximal right femoral Fracture. S/p Reduction and internal fixation of two-part intertrochanteric righthip fracture with Biomet Affixis TFN nail. POD3. Pain control Is adequate and DVT prophylaxis to be continued for 14 days total.   2. Influenza A . Continue Tamiflu to complete course  3 . Orthostatic Hypotension.  Initially the patient's blood pressure has improved with IV fluids. Patient is being transfused with packed red blood cells. Follow orthostatic vital signs closely as outpatient, initiate midodrine or fludrocortisone if needed  .  It is also  recommended to follow patient's oral intake, especially fluid intake closely. It is recommended to follow patient's orthostatic blood pressure readings, allow patient to sit on the bed for some period of time before standing up for physical therapy or any other activity, she may need to be initiated on midodrine or even fludrocortisone to control her orthostatic hypotension symptoms as outpatient. It is recommended to follow patient's hemoglobin level closely to ensure that the patient does not bleed within the surgical wound with current Lovenox dose.    4. Essential hypertension, now relatively hypotensive.Holdingamlodipine and  lisinopril due to hypotension. Resumed Coreg  5. Glaucoma continue eyedrops  6. Anxiety continue Ativan when necessary  7. Misc: lovenox post surgery for 14 days  8. + ppd neg cxr, sw involved for snf for rehab  9 Thrombocytopenia. Platelets improved to 94, likely consumption, it is recommended to follow platelet count as outpatient.  10 Malnutrition. Dietitian consult as outpatient.  11. Acute posthemorrhagic anemia, status post packed red blood cell transfusion, hemoglobin level improved from 7.9-8.4,  then injected down  to 7.9 today, patient has some orthostatic  hypotension, she will be transfused with one more unit of packed red blood cells today .  Afterwards, patient will be discharged to skilled nursing facility for rehabilitation.  It is recommended to continue iron supplementation, Follow hemoglobin level closely  DISCHARGE CONDITIONS:   Stable  CONSULTS OBTAINED:  Treatment Team:  Christena Flake, MD  DRUG ALLERGIES:  No Known Allergies  DISCHARGE MEDICATIONS:   Current Discharge Medication List    START taking these medications   Details  docusate sodium (COLACE) 100 MG capsule Take 1 capsule (100 mg total) by mouth 2 (two) times daily. Qty: 10 capsule, Refills: 0    enoxaparin (LOVENOX) 40 MG/0.4ML injection Inject 0.4 mLs (40 mg total) into the skin daily. Qty: 0 Syringe    ferrous sulfate 325 (65 FE) MG tablet Take 1 tablet (325 mg total) by mouth 3 (three) times daily with meals. Qty: 90 tablet, Refills: 0    HYDROcodone-acetaminophen (NORCO/VICODIN) 5-325 MG tablet Take 1 tablet by mouth every 6 (six) hours as needed for moderate pain. Qty: 12 tablet, Refills: 0    oseltamivir (TAMIFLU) 30 MG capsule Take 1 capsule (30 mg total) by mouth 2 (two) times daily.      CONTINUE these medications which have CHANGED   Details  LORazepam (ATIVAN) 1 MG tablet Take 1 tablet (1 mg total) by mouth every 8 (eight) hours as needed (agitation). Qty: 12 tablet, Refills: 0      CONTINUE these medications which have NOT CHANGED   Details  acetaminophen (TYLENOL) 500 MG tablet Take 500 mg by mouth every 6 (six) hours as needed.    alendronate (FOSAMAX) 70 MG tablet Take 70 mg by mouth once a week. Take with a full glass of water on an empty stomach.    aspirin 81 MG chewable tablet Chew 81 mg by mouth daily.    carvedilol (COREG) 3.125 MG tablet Take 3.125 mg by mouth 2 (two) times daily with a meal.    cholecalciferol (VITAMIN D) 1000 units tablet Take 1,000 Units by mouth daily.    Fluocinolone Acetonide 0.01 % OIL Place 1  application in ear(s) daily.    latanoprost (XALATAN) 0.005 % ophthalmic solution Place 1 drop into both eyes at bedtime. Refills: 5      STOP taking these medications     amLODipine (NORVASC) 10 MG tablet      lisinopril (PRINIVIL,ZESTRIL) 20 MG tablet          DISCHARGE INSTRUCTIONS:    The patient is to follow-up with primary care physician and orthopedic surgeon as outpatient  If you experience worsening of your admission symptoms, develop shortness of breath, life threatening emergency, suicidal or homicidal thoughts you must seek medical attention immediately by calling 911 or calling your MD immediately  if symptoms less severe.  You Must read complete instructions/literature along with all the possible adverse reactions/side effects for all the Medicines you take and that have been prescribed to you. Take any new Medicines after you have completely understood and accept all the possible adverse reactions/side effects.   Please note  You were cared for by a hospitalist during your hospital stay. If you have any questions about your discharge medications or the care you received while you were in the hospital after you are discharged, you can call the unit and asked to speak with the hospitalist on call if the hospitalist that took care of you is not available. Once you  are discharged, your primary care physician will handle any further medical issues. Please note that NO REFILLS for any discharge medications will be authorized once you are discharged, as it is imperative that you return to your primary care physician (or establish a relationship with a primary care physician if you do not have one) for your aftercare needs so that they can reassess your need for medications and monitor your lab values.    Today   CHIEF COMPLAINT:   Chief Complaint  Patient presents with  . Fall    HISTORY OF PRESENT ILLNESS:  Yesenia Leonard  is a 83 y.o. female with a known history of  dementia, hypertension, hypercholesterolemia, osteoporosis, who presents to the hospital after fall with right hip pain. Patient was not able to walk. X-ray of the leg revealed moderately displaced intertrochanteric hip fracture. Patient was admitted to the hospital for evaluation and treatment. She was seen by orthopedist surgeon and underwent intramedullary nail placement on February 1, postoperatively, patient did well, however, required IV fluid infusion due to orthostatic hypotension and acute posthemorrhagic anemia, she was transfused packed red blood cells with improvement of her hemoglobin level. She however was noted to have some dizziness and intermittent orthostatic hypotension. She was seen by physical therapist and recommended skilled nursing facility placement for rehabilitation when she will be discharged today. Of note she was also diagnosed with influenza type A, for which she receives therapy  Discussion by problem: 1. Moderately displaced intertrochanteric proximal right femoral Fracture. S/p Reduction and internal fixation of two-part intertrochanteric righthip fracture with Biomet Affixis TFN nail. POD3. Pain control Is adequate and DVT prophylaxis to be continued for 14 days total.   2. Influenza A . Continue Tamiflu to complete course  3 . Orthostatic Hypotension.  Initially the patient's blood pressure has improved with IV fluids. Patient is being transfused with packed red blood cells. Follow orthostatic vital signs closely as outpatient, initiate midodrine or fludrocortisone if needed  .  It is also  recommended to follow patient's oral intake, especially fluid intake closely. It is recommended to follow patient's orthostatic blood pressure readings, allow patient to sit on the bed for some period of time before standing up for physical therapy or any other activity, she may need to be initiated on midodrine or even fludrocortisone to control her orthostatic hypotension  symptoms as outpatient. It is recommended to follow patient's hemoglobin level closely to ensure that the patient does not bleed within the surgical wound with current Lovenox dose.    4. Essential hypertension, now relatively hypotensive.Holdingamlodipine and  lisinopril due to hypotension. Resumed Coreg  5. Glaucoma continue eyedrops  6. Anxiety continue Ativan when necessary  7. Misc: lovenox post surgery for 14 days  8. + ppd neg cxr, sw involved for snf for rehab  9 Thrombocytopenia. Platelets improved to 94, likely consumption, it is recommended to follow platelet count as outpatient.  10 Malnutrition. Dietitian consult as outpatient.  11. Acute posthemorrhagic anemia, status post packed red blood cell transfusion, hemoglobin level improved from 7.9-8.4,  then injected down to 7.9 today, patient has some orthostatic hypotension, she will be transfused with one more unit of packed red blood cells today .  Afterwards, patient will be discharged to skilled nursing facility for rehabilitation.  It is recommended to continue iron supplementation, Follow hemoglobin level closely     VITAL SIGNS:  Blood pressure 117/61, pulse 95, temperature 98.7 F (37.1 C), temperature source Oral, resp. rate 16, height 5\' 5"  (  1.651 m), weight 59 kg (130 lb), SpO2 92 %.  I/O:    Intake/Output Summary (Last 24 hours) at 12/23/16 1230 Last data filed at 12/23/16 0218  Gross per 24 hour  Intake              240 ml  Output                5 ml  Net              235 ml    PHYSICAL EXAMINATION:  GENERAL:  83 y.o.-year-old patient lying in the bed with no acute distress.  EYES: Pupils equal, round, reactive to light and accommodation. No scleral icterus. Extraocular muscles intact.  HEENT: Head atraumatic, normocephalic. Oropharynx and nasopharynx clear.  NECK:  Supple, no jugular venous distention. No thyroid enlargement, no tenderness.  LUNGS: Normal breath sounds bilaterally, no  wheezing, rales,rhonchi or crepitation. No use of accessory muscles of respiration.  CARDIOVASCULAR: S1, S2 normal. No murmurs, rubs, or gallops.  ABDOMEN: Soft, non-tender, non-distended. Bowel sounds present. No organomegaly or mass.  EXTREMITIES: No pedal edema, cyanosis, or clubbing.  NEUROLOGIC: Cranial nerves II through XII are intact. Muscle strength 5/5 in all extremities. Sensation intact. Gait not checked.  PSYCHIATRIC: The patient is alert and oriented x 3.  SKIN: No obvious rash, lesion, or ulcer.   DATA REVIEW:   CBC  Recent Labs Lab 12/23/16 0812  WBC 7.9  HGB 7.9*  HCT 23.8*  PLT 123*    Chemistries   Recent Labs Lab 12/19/16 1041  12/22/16 0601  NA 138  < > 141  K 3.5  < > 3.4*  CL 102  < > 109  CO2 29  < > 27  GLUCOSE 102*  < > 84  BUN 18  < > 9  CREATININE 0.81  < > 0.45  CALCIUM 10.2  < > 9.3  AST 40  --   --   ALT 23  --   --   ALKPHOS 50  --   --   BILITOT 0.9  --   --   < > = values in this interval not displayed.  Cardiac Enzymes  Recent Labs Lab 12/19/16 1550  TROPONINI 0.04*    Microbiology Results  Results for orders placed or performed during the hospital encounter of 12/19/16  Culture, blood (Routine x 2)     Status: None (Preliminary result)   Collection Time: 12/19/16 10:43 AM  Result Value Ref Range Status   Specimen Description BLOOD R HAND  Final   Special Requests   Final    BOTTLES DRAWN AEROBIC AND ANAEROBIC AER 6ML ANA 8ML   Culture NO GROWTH 4 DAYS  Final   Report Status PENDING  Incomplete  Culture, blood (Routine x 2)     Status: None (Preliminary result)   Collection Time: 12/19/16 10:43 AM  Result Value Ref Range Status   Specimen Description BLOOD L FA  Final   Special Requests   Final    BOTTLES DRAWN AEROBIC AND ANAEROBIC AER 8ML ANA 7ML   Culture NO GROWTH 4 DAYS  Final   Report Status PENDING  Incomplete  Urine culture     Status: None   Collection Time: 12/19/16 11:34 AM  Result Value Ref Range Status    Specimen Description URINE, RANDOM  Final   Special Requests NONE  Final   Culture   Final    NO GROWTH Performed at [facility], [address] [place], [place] [address]    Report Status 12/20/2016 FINAL  Final    RADIOLOGY:  No results found.  EKG:   Orders placed or performed during the hospital encounter of 12/19/16  . ED EKG  . ED EKG  . EKG 12-Lead  . EKG 12-Lead      Management plans discussed with the patient, family and they are in agreement.  CODE STATUS:     Code Status Orders        Start     Ordered   12/19/16 1900  Full code  Continuous     12/19/16 1859    Code Status History    Date Active Date Inactive Code Status Order ID Comments User Context   12/19/2016  6:59 PM  Full Code [id number]  Christena Flake, MD Inpatient      TOTAL TIME TAKING CARE OF THIS PATIENT: 40  minutes.    [name] M.D on 12/23/2016 at 12:30 PM  Between 7am to 6pm - Pager - [phone]  After 6pm go to [email or web address] - password [email or web address] [facility]  [place] [facility]  Office  [phone]  CC: Primary care physician; [facility]

## 2016-12-23 NOTE — Progress Notes (Signed)
Physical Therapy Treatment Patient Details Name: Yesenia GalaDoris O Leonard MRN: [id number] DOB: [birth year] Today's Date: 12/23/2016    History of Present Illness Pt is a 83 y/o female that presents after fall at home suffering a R hip fx, repaired via ORIF. History of dementia and osteoporosis.     PT Comments    Pt agreeable to bed exercises only. Pt continues with fatigue/lethargy. According to chart review, pt to transfers to skilled nursing facility today; however, no current discharge. Pt continues to display Right lower extremity pain with movement; no discomfort at rest. Pt participates in limited bed exercises with assist required on the right. If pt remains admitted, continue PT to progress activity tolerance, upright position and strength to improve functional mobility.   Follow Up Recommendations  SNF     Equipment Recommendations       Recommendations for Other Services       Precautions / Restrictions Precautions Precautions: Fall Restrictions Weight Bearing Restrictions: Yes RLE Weight Bearing: Weight bearing as tolerated    Mobility  Bed Mobility Overal bed mobility: Needs Assistance Bed Mobility: Supine to Sit;Sit to Supine     Supine to sit: Mod assist;Max assist     General bed mobility comments: Not tested; agreeable to exercises only  Transfers                 General transfer comment: Not attempted due to BP and increased hypotensive symptoms in sit  Ambulation/Gait                 Stairs            Wheelchair Mobility    Modified Rankin (Stroke Patients Only)       Balance                                    Cognition Arousal/Alertness: Lethargic Behavior During Therapy: WFL for tasks assessed/performed Overall Cognitive Status: History of cognitive impairments - at baseline                      Exercises General Exercises - Lower Extremity Ankle Circles/Pumps: AROM;10 reps;Both;Supine Quad Sets:  Strengthening;Both;Supine;10 reps (poor comprehension/QS) Gluteal Sets: Other (comment) (attempted; poor understanding or ability) Short Arc Quad: AAROM;Both;10 reps;Supine Heel Slides: AAROM;Both;10 reps;Supine Hip ABduction/ADduction: AAROM;Right;10 reps;Supine (AROM L) Straight Leg Raises: AAROM;Both;Supine;10 reps    General Comments        Pertinent Vitals/Pain Pain Assessment: Faces Faces Pain Scale: Hurts even more Pain Location: R hip/LE    Home Living                      Prior Function            PT Goals (current goals can now be found in the care plan section) Progress towards PT goals: Not progressing toward goals - comment (no greater function seen)    Frequency    BID      PT Plan Current plan remains appropriate    Co-evaluation             End of Session Equipment Utilized During Treatment: Oxygen Activity Tolerance: Patient limited by fatigue;Patient limited by lethargy;Other (comment) (Symptomatic low BP) Patient left: in bed;with call bell/phone within reach;with bed alarm set     Time: [id number] PT Time Calculation (min) (ACUTE ONLY): 20 min  Charges:  $Therapeutic Exercise: 8-22 mins $Therapeutic  Activity: 8-22 mins                    G Codes:      Scot Dock, PTA 12/23/2016, 2:09 PM

## 2016-12-23 NOTE — Progress Notes (Signed)
Subjective: 4 Days Post-Op Procedure(s) (LRB): INTRAMEDULLARY (IM) NAIL INTERTROCHANTRIC (Right) Patient reports pain as mild.   Patient is well, and has had no acute complaints or problems Continue with physical therapy today.  Plan is to go Rehab after hospital stay. no nausea and no vomiting Patient denies any chest pains or shortness of breath. Patient resting this AM upon entering the room.  Objective: Vital signs in last 24 hours: Temp:  [98.2 F (36.8 C)-99.8 F (37.7 C)] 99.8 F (37.7 C) (02/05 0600) Pulse Rate:  [87-101] 101 (02/05 0600) Resp:  [18-19] 18 (02/05 0600) BP: (105-146)/(57-74) 129/65 (02/05 0600) SpO2:  [95 %-99 %] 96 % (02/05 0600) well approximated incision Compression leg wraps in place Intake/Output from previous day: 02/04 0701 - 02/05 0700 In: 240 [P.O.:240] Out: 7 [Urine:7] Intake/Output this shift: No intake/output data recorded.   Recent Labs  12/21/16 0907 12/21/16 1305 12/22/16 0601 12/22/16 1129 12/22/16 1516  HGB 8.4* 9.0* 8.4* 8.7* 8.1*    Recent Labs  12/21/16 0441 12/21/16 1305 12/22/16 1129 12/22/16 1516  WBC 5.9 6.8  --   --   RBC 2.71* 3.01*  --   --   HCT 23.8* 26.4* 25.4* 23.8*  PLT 96* 94*  --   --     Recent Labs  12/21/16 0441 12/22/16 0601  NA 141 141  K 3.8 3.4*  CL 112* 109  CO2 28 27  BUN 18 9  CREATININE 0.74 0.45  GLUCOSE 89 84  CALCIUM 9.5 9.3   No results for input(s): LABPT, INR in the last 72 hours.  EXAM General - Patient is Alert, Appropriate and Oriented Extremity - Neurologically intact Neurovascular intact Sensation intact distally Intact pulses distally Dorsiflexion/Plantar flexion intact No cellulitis present Compartment soft Dressing - Scant bloody drainage to the most proximal incision. Motor Function - intact, moving foot and toes well on exam.    Past Medical History:  Diagnosis Date  . Dementia   . Hypercalcemia   . Hypertension   . Hypokalemia   . Osteoporosis    . PPD positive   . Pre-diabetes     Assessment/Plan: 4 Days Post-Op Procedure(s) (LRB): INTRAMEDULLARY (IM) NAIL INTERTROCHANTRIC (Right) Principal Problem:   Hip fracture (HCC) Active Problems:   S/P ORIF (open reduction internal fixation) fracture   Hypotension   Acute posthemorrhagic anemia   H/O transfusion of packed red blood cells   Dizziness   Orthostatic hypotension   Thrombocytopenia (HCC)  Estimated body mass index is 21.63 kg/m as calculated from the following:   Height as of this encounter: 5\' 5"  (1.651 m).   Weight as of this encounter: 59 kg (130 lb). Up with therapy Discharge to SNF  Labs: Hemoglobin 8.1 yesterday, no current labs available, will place order for CBC today, pt is s/p 1 unit of PRBC. Pt has had a BM.  [facility] weeks. Sooner if any problems Staples to be removed 01/02/17 and apply benzoin and steri strips Continue lovenox for 14 days post op 40 mg Change dressing as needed. Elevate heels off bed with rolled towels. Orthopaedics will sign off at this time.  DVT Prophylaxis - Lovenox, Foot Pumps and TED hose Weight-Bearing as tolerated to right leg  J. Horris LatinoLance McGhee, PA-C [facility] 12/23/2016, 7:39 AM

## 2016-12-23 NOTE — Progress Notes (Signed)
Orthostatic VS obtain per MD and notified of results. MD to order blood transfusion.

## 2016-12-23 NOTE — Discharge Instructions (Signed)
F/u in [facility] in 6 weeks. Sooner if any problems Staples to be removed 01/02/17 and apply benzoin and steri strips Continue lovenox for 14 days post op 40 mg Change dressing as needed. Elevate heels off bed with rolled towels.

## 2016-12-23 NOTE — Progress Notes (Signed)
Pt being discharged [facility]. Report called to Latrice RN at SNF, all questions answered. PIV to be removed prior to discharge. Prescriptions sent in packet with patient. She is leaving with all her belongings, will be transported via EMS pending.

## 2016-12-23 NOTE — Progress Notes (Signed)
Patient is medically stable for D/C to [facility] today. [facility] can come today to room 39. RN will call report and arrange EMS. Clinical Child psychotherapistocial Worker (CSW) sent D/C orders to [facility] via [employer]. Patient is aware of above. CSW contacted patient's sister [name] and made her aware of above. Please reconsult if future social work needs arise. CSW signing off.   [employer]iley Quinterious Walraven, LCSW [phone]

## 2016-12-23 NOTE — Clinical Social Work Placement (Signed)
   CLINICAL SOCIAL WORK PLACEMENT  NOTE  Date:  12/23/2016  Patient Details  Name: Yesenia Leonard MRN: [id number] Date of Birth: July 08, 1924  Clinical Social Work is seeking post-discharge placement for this patient at the Skilled  Nursing Facility level of care (*CSW will initial, date and re-position this form in  chart as items are completed):  Yes   Patient/family provided with [place] Clinical Social Work Department's list of facilities offering this level of care within the geographic area requested by the patient (or if unable, by the patient's family).  Yes   Patient/family informed of their freedom to choose among providers that offer the needed level of care, that participate in Medicare, Medicaid or managed care program needed by the patient, have an available bed and are willing to accept the patient.  Yes   Patient/family informed of [name] ownership interest in [facility] and [facility], as well as of the fact that they are under no obligation to receive care at these facilities.  PASRR submitted to EDS on 12/20/16     PASRR number received on 12/20/16     Existing PASRR number confirmed on       FL2 transmitted to all facilities in geographic area requested by pt/family on 12/20/16     FL2 transmitted to all facilities within larger geographic area on       Patient informed that his/her managed care company has contracts with or will negotiate with certain facilities, including the following:        Yes   Patient/family informed of bed offers received.  Patient chooses bed at  [name] )     Physician recommends and patient chooses bed at      Patient to be transferred to  [name] ) on 12/23/16.  Patient to be transferred to facility by  [name] )     Patient family notified on 12/23/16 of transfer.  Name of family member notified:   (Patient's sister [name] is aware of D/C today. )     PHYSICIAN     Additional Comment:    _______________________________________________ [name], [name], LCSW 12/23/2016, 11:45 AM

## 2016-12-23 NOTE — Progress Notes (Signed)
Physical Therapy Treatment Patient Details Name: Yesenia Leonard MRN: [id number] DOB: 09/25/1924 Today's Date: 12/23/2016    History of Present Illness Pt is a 83 y/o female that presents after fall at home suffering a R hip fx, repaired via ORIF. History of dementia and osteoporosis.     PT Comments    Pt agreeable to PT; lethargic/groggy. Denies dizziness in supine. Mod A for supine to sit with hypotensive symptoms that come on quickly; BP 111/43. Pt returned to supine with improved symptoms and ability to participate in assisted supine exercises. Pt did not become unresponsive, but less so in sit requiring increased cueing and commands. Continue PT to improve response to positional changes, increase strength and endurance to improve all functional mobility. Plan to see pt this afternoon.   Follow Up Recommendations  SNF     Equipment Recommendations       Recommendations for Other Services       Precautions / Restrictions Precautions Precautions: Fall Restrictions Weight Bearing Restrictions: Yes RLE Weight Bearing: Weight bearing as tolerated    Mobility  Bed Mobility Overal bed mobility: Needs Assistance Bed Mobility: Supine to Sit;Sit to Supine     Supine to sit: Mod assist;Max assist     General bed mobility comments: Assist for trunk and LEs  Transfers                 General transfer comment: Not attempted due to BP and increased hypotensive symptoms in sit  Ambulation/Gait                 Stairs            Wheelchair Mobility    Modified Rankin (Stroke Patients Only)       Balance                                    Cognition Arousal/Alertness: Lethargic Behavior During Therapy: WFL for tasks assessed/performed Overall Cognitive Status: History of cognitive impairments - at baseline                      Exercises General Exercises - Lower Extremity Ankle Circles/Pumps: AROM;10 reps;Both;Supine Quad  Sets: Strengthening;Both;5 reps;Supine (poor comprehension/QS) Gluteal Sets: Other (comment) (attempted; poor understanding or ability) Short Arc Quad: AAROM;Both;10 reps;Supine Heel Slides: AAROM;Both;10 reps;Supine Hip ABduction/ADduction: AAROM;Right;10 reps;Supine (AROM L) Straight Leg Raises: AAROM;Both;Supine;10 reps    General Comments        Pertinent Vitals/Pain Pain Assessment: Faces Faces Pain Scale: Hurts whole lot Pain Location: R hip/LE    Home Living                      Prior Function            PT Goals (current goals can now be found in the care plan section) Progress towards PT goals: Progressing toward goals (slowly)    Frequency    BID      PT Plan Current plan remains appropriate    Co-evaluation             End of Session Equipment Utilized During Treatment: Oxygen Activity Tolerance: Patient limited by fatigue;Patient limited by lethargy;Other (comment) (Symptomatic low BP) Patient left: in bed;with call bell/phone within reach;with bed alarm set     Time: 1041-1106 PT Time Calculation (min) (ACUTE ONLY): 25 min  Charges:  $Therapeutic Exercise: 8-22 mins $Therapeutic  Activity: 8-22 mins                    G Codes:      Scot DockHeidi E Barnes, PTA 12/23/2016, 11:04 AM

## 2016-12-24 LAB — TYPE AND SCREEN
ABO/RH(D): O POS
ANTIBODY SCREEN: NEGATIVE
Unit division: 0

## 2016-12-24 LAB — CULTURE, BLOOD (ROUTINE X 2)
CULTURE: NO GROWTH
Culture: NO GROWTH

## 2017-06-12 ENCOUNTER — Encounter: Payer: Self-pay | Admitting: Emergency Medicine

## 2017-06-12 ENCOUNTER — Emergency Department
Admission: EM | Admit: 2017-06-12 | Discharge: 2017-06-16 | Disposition: A | Discharge status: Home / Self Care | Payer: Medicare Other | Attending: Emergency Medicine | Admitting: Emergency Medicine

## 2017-06-12 DIAGNOSIS — I10 Essential (primary) hypertension: Secondary | ICD-10-CM | POA: Insufficient documentation

## 2017-06-12 DIAGNOSIS — F0391 Unspecified dementia with behavioral disturbance: Secondary | ICD-10-CM | POA: Insufficient documentation

## 2017-06-12 DIAGNOSIS — Z7982 Long term (current) use of aspirin: Secondary | ICD-10-CM | POA: Diagnosis not present

## 2017-06-12 DIAGNOSIS — Z79899 Other long term (current) drug therapy: Secondary | ICD-10-CM | POA: Insufficient documentation

## 2017-06-12 DIAGNOSIS — R4182 Altered mental status, unspecified: Secondary | ICD-10-CM | POA: Diagnosis present

## 2017-06-12 LAB — COMPREHENSIVE METABOLIC PANEL
ALT: 9 U/L — ABNORMAL LOW (ref 14–54)
ANION GAP: 8 (ref 5–15)
AST: 29 U/L (ref 15–41)
Albumin: 3.9 g/dL (ref 3.5–5.0)
Alkaline Phosphatase: 74 U/L (ref 38–126)
BUN: 9 mg/dL (ref 6–20)
CHLORIDE: 108 mmol/L (ref 101–111)
CO2: 31 mmol/L (ref 22–32)
Calcium: 11.2 mg/dL — ABNORMAL HIGH (ref 8.9–10.3)
Creatinine, Ser: 0.78 mg/dL (ref 0.44–1.00)
GFR calc non Af Amer: >60 mL/min (ref >60)
Glucose, Bld: 115 mg/dL — ABNORMAL HIGH (ref 65–99)
Potassium: 4 mmol/L (ref 3.5–5.1)
SODIUM: 147 mmol/L — AB (ref 135–145)
Total Bilirubin: 1.3 mg/dL — ABNORMAL HIGH (ref 0.3–1.2)
Total Protein: 7.5 g/dL (ref 6.5–8.1)

## 2017-06-12 LAB — ETHANOL

## 2017-06-12 LAB — URINE DRUG SCREEN, QUALITATIVE (ARMC ONLY)
Amphetamines, Ur Screen: NOT DETECTED
BARBITURATES, UR SCREEN: NOT DETECTED
BENZODIAZEPINE, UR SCRN: POSITIVE — AB
CANNABINOID 50 NG, UR ~~LOC~~: NOT DETECTED
Cocaine Metabolite,Ur ~~LOC~~: NOT DETECTED
MDMA (Ecstasy)Ur Screen: NOT DETECTED
Methadone Scn, Ur: NOT DETECTED
Opiate, Ur Screen: NOT DETECTED
PHENCYCLIDINE (PCP) UR S: NOT DETECTED
Tricyclic, Ur Screen: NOT DETECTED

## 2017-06-12 LAB — CBC WITH DIFFERENTIAL/PLATELET
Basophils Absolute: 0 10*3/uL (ref 0–0.1)
Basophils Relative: 1 %
EOS ABS: 0.2 10*3/uL (ref 0–0.7)
Eosinophils Relative: 3 %
HCT: 45.2 % (ref 35.0–47.0)
HEMOGLOBIN: 15.1 g/dL (ref 12.0–16.0)
LYMPHS ABS: 2.1 10*3/uL (ref 1.0–3.6)
Lymphocytes Relative: 29 %
MCH: 29.8 pg (ref 26.0–34.0)
MCHC: 33.4 g/dL (ref 32.0–36.0)
MCV: 89.1 fL (ref 80.0–100.0)
Monocytes Absolute: 0.5 10*3/uL (ref 0.2–0.9)
Monocytes Relative: 7 %
NEUTROS PCT: 60 %
Neutro Abs: 4.3 10*3/uL (ref 1.4–6.5)
Platelets: 158 10*3/uL (ref 150–440)
RBC: 5.07 MIL/uL (ref 3.80–5.20)
RDW: 14 % (ref 11.5–14.5)
WBC: 7.2 10*3/uL (ref 3.6–11.0)

## 2017-06-12 LAB — URINALYSIS, COMPLETE (UACMP) WITH MICROSCOPIC
BILIRUBIN URINE: NEGATIVE
Bacteria, UA: NONE SEEN
GLUCOSE, UA: NEGATIVE mg/dL
KETONES UR: NEGATIVE mg/dL
Leukocytes, UA: NEGATIVE
NITRITE: NEGATIVE
PROTEIN: NEGATIVE mg/dL
Specific Gravity, Urine: 1.008 (ref 1.005–1.030)
Squamous Epithelial / LPF: NONE SEEN
pH: 8 (ref 5.0–8.0)

## 2017-06-12 LAB — SALICYLATE LEVEL

## 2017-06-12 LAB — ACETAMINOPHEN LEVEL

## 2017-06-12 MED ORDER — ASPIRIN 81 MG PO CHEW: 81.0000 mg | CHEWABLE_TABLET | Freq: Every day | ORAL | Status: DC

## 2017-06-12 MED ORDER — LORAZEPAM 2 MG/ML IJ SOLN: 1.0000 mg | Freq: Once | INTRAMUSCULAR | Status: DC

## 2017-06-12 MED ORDER — LORAZEPAM 2 MG/ML IJ SOLN
0.5000 mg | Freq: Once | INTRAMUSCULAR | Status: AC
  Administered 2017-06-12: 0.5 mg via INTRAVENOUS
  Filled 2017-06-12: qty 1

## 2017-06-12 MED ORDER — CARVEDILOL 6.25 MG PO TABS
3.1250 mg | ORAL_TABLET | Freq: Two times a day (BID) | ORAL | Status: DC
  Administered 2017-06-13 – 2017-06-15 (×5): 3.125 mg via ORAL
  Filled 2017-06-12 (×6): qty 1

## 2017-06-12 MED ORDER — LATANOPROST 0.005 % OP SOLN
1.0000 [drp] | Freq: Every day | OPHTHALMIC | Status: DC
  Administered 2017-06-13 – 2017-06-15 (×3): 1 [drp] via OPHTHALMIC
  Filled 2017-06-12: qty 2.5

## 2017-06-12 MED ORDER — LORAZEPAM 1 MG PO TABS
1.0000 mg | ORAL_TABLET | Freq: Three times a day (TID) | ORAL | Status: DC | PRN
  Administered 2017-06-12 – 2017-06-14 (×2): 1 mg via ORAL
  Filled 2017-06-12 (×3): qty 1

## 2017-06-12 MED ORDER — SODIUM CHLORIDE 0.9 % IV BOLUS (SEPSIS)
500.0000 mL | Freq: Once | INTRAVENOUS | Status: AC
  Administered 2017-06-12: 500 mL via INTRAVENOUS

## 2017-06-12 MED ORDER — ASPIRIN 81 MG PO CHEW
81.0000 mg | CHEWABLE_TABLET | Freq: Every day | ORAL | Status: DC
  Administered 2017-06-13 – 2017-06-16 (×4): 81 mg via ORAL
  Filled 2017-06-12 (×4): qty 1

## 2017-06-12 NOTE — ED Notes (Signed)
IVC/SOC called @1756  / waiting on MD to evaluate

## 2017-06-12 NOTE — ED Notes (Signed)
Pt's ivc reversed. Pt will need sw consult in the am for placement. Nurse from [facility] brought a purse over from [facility] locker room stating the bag has been locked up since pt's last admission and needed to be sent with pt when she is placed. Spoke with charge nurse to let her know the purse was sent over from [facility]. Purse placed with pt's belongings in her room with sitter.

## 2017-06-12 NOTE — ED Provider Notes (Addendum)
[facility] Emergency Department Provider Note  ____________________________________________   I have reviewed the triage vital signs and the nursing notes.   HISTORY  Chief Complaint Altered Mental Status    HPI Yesenia Leonard is a 83 y.o. female With a history of dementia which sometimes is violent, she cannot give me any history because she has significant dementia, Alzheimer's. History is therefore from herDSS representative, miss Yesenia Leonard as well as her sister, Mrs. Yesenia Leonard. According to these people, patient was made a ward of the state earlier today, sent to [facility] for memory care for her Alzheimer's, once she got there she became aggravated and upset and began D Yesenia Leonard things around. No trauma. She states she did not want to be there. Patient baseline is confused. Paranoid thoughts are routine for her. She has had no fever no chills no other signsof acute illness. Tas also able to talk to Yesenia Leonard, Adult [employer], her guardian, who is working actively to get the patient into memory care. She does agree with reversing IVC on this patient as she is not competent to make her decisions and will not be allowed to leave anyway as it is not safe for her.Level 5 chart caveat; no further history available due to patient status.   Past Medical History:  Diagnosis Date  . Dementia   . Hypercalcemia   . Hypertension   . Hypokalemia   . Osteoporosis   . PPD positive   . Pre-diabetes     Patient Active Problem List   Diagnosis Date Noted  . S/P ORIF (open reduction internal fixation) fracture 12/22/2016  . Hypotension 12/22/2016  . Acute posthemorrhagic anemia 12/22/2016  . H/O transfusion of packed red blood cells 12/22/2016  . Dizziness 12/22/2016  . Orthostatic hypotension 12/22/2016  . Thrombocytopenia (HCC) 12/22/2016  . Hip fracture (HCC) 12/19/2016  . Alzheimer's dementia 12/04/2016    Past Surgical History:   Procedure Laterality Date  . APPENDECTOMY    . INTRAMEDULLARY (IM) NAIL INTERTROCHANTERIC Right 12/19/2016   Procedure: INTRAMEDULLARY (IM) NAIL INTERTROCHANTRIC;  Surgeon: Christena Flake, MD;  Location: [facility] ORS;  Service: Orthopedics;  Laterality: Right;    Prior to Admission medications   Medication Sig Start Date End Date Taking? Authorizing Provider  acetaminophen (TYLENOL) 500 MG tablet Take 500 mg by mouth every 6 (six) hours as needed.    [provider]  alendronate (FOSAMAX) 70 MG tablet Take 70 mg by mouth once a week. Take with a full glass of water on an empty stomach.    [provider]  aspirin 81 MG chewable tablet Chew 81 mg by mouth daily.    [provider]  carvedilol (COREG) 3.125 MG tablet Take 3.125 mg by mouth 2 (two) times daily with a meal.    [provider]  cholecalciferol (VITAMIN D) 1000 units tablet Take 1,000 Units by mouth daily.    [provider]  docusate sodium (COLACE) 100 MG capsule Take 1 capsule (100 mg total) by mouth 2 (two) times daily. 12/20/16   Yesenia Pollack, MD  enoxaparin (LOVENOX) 40 MG/0.4ML injection Inject 0.4 mLs (40 mg total) into the skin daily. 12/21/16   Yesenia Pollack, MD  ferrous sulfate 325 (65 FE) MG tablet Take 1 tablet (325 mg total) by mouth 3 (three) times daily with meals. 12/22/16   Yesenia Caper, MD  Fluocinolone Acetonide 0.01 % OIL Place 1 application in ear(s) daily.    [provider]  HYDROcodone-acetaminophen (NORCO/VICODIN) 5-325 MG tablet Take 1 tablet by mouth every 6 (six) hours as needed for moderate pain. 12/23/16   Yesenia CaperVaickute, Rima, MD  latanoprost (XALATAN) 0.005 % ophthalmic solution Place 1 drop into both eyes at bedtime. 11/28/16   [provider]  LORazepam (ATIVAN) 1 MG tablet Take 1 tablet (1 mg total) by mouth every 8 (eight) hours as needed (agitation). 12/23/16 12/23/17  Yesenia CaperVaickute, Rima, MD  oseltamivir (TAMIFLU) 30 MG capsule Take 1 capsule (30 mg total) by mouth 2  (two) times daily. 12/20/16   Yesenia Pollackhen, [name], MD    Allergies Patient has no known allergies.  Family History  Problem Relation Age of Onset  . Hypertension Mother     Social History Social History  Substance Use Topics  . Smoking status: Never Smoker  . Smokeless tobacco: Never Used  . Alcohol use No    Review of Systems Level 5 chart caveat; no further history available due to patient status.   ____________________________________________   PHYSICAL EXAM:  VITAL SIGNS: ED Triage Vitals  Enc Vitals Group     BP 06/12/17 1725 (!) 188/107     Pulse Rate 06/12/17 1725 82     Resp 06/12/17 1725 16     Temp 06/12/17 1725 (!) 97.4 F (36.3 C)     Temp Source 06/12/17 1725 Oral     SpO2 06/12/17 1725 96 %     Weight 06/12/17 1728 124 lb (56.2 kg)     Height --      Head Circumference --      Peak Flow --      Pain Score --      Pain Loc --      Pain Edu? --      Excl. in GC? --     Constitutional: Alert and to name unsure of the place, pleasantly demented, tangential flow of ideas noted.. Well appearing and in no acute distress. Eyes: Conjunctivae are normal Head: Atraumatic HEENT: No congestion/rhinnorhea. Mucous membranes are moist.  Oropharynx non-erythematous Neck:   Nontender with no meningismus, no masses, no stridor Cardiovascular: Normal rate, regular rhythm. Grossly normal heart sounds.  Good peripheral circulation. Respiratory: Normal respiratory effort.  No retractions. Lungs CTAB. Abdominal: Soft and nontender. No distention. No guarding no rebound Back:  There is no focal tenderness or step off.  there is no midline tenderness there are no lesions noted. there is no CVA tenderness Musculoskeletal: No lower extremity tenderness, no upper extremity tenderness. No joint effusions, no DVT signs strong distal pulses no edema Neurologic:  Normal speech and language. No gross focal neurologic deficits are appreciated.  Skin:  Skin is warm, dry and intact. No rash  noted. Psychiatric: Mood and affect are demented and a little upset sometimes. Speech and behavior are normal.  ____________________________________________   LABS (all labs ordered are listed, but only abnormal results are displayed)  Labs Reviewed  CBC WITH DIFFERENTIAL/PLATELET  URINALYSIS, COMPLETE (UACMP) WITH MICROSCOPIC  URINE DRUG SCREEN, QUALITATIVE ([facility] ONLY)  COMPREHENSIVE METABOLIC PANEL  ETHANOL  ACETAMINOPHEN LEVEL  SALICYLATE LEVEL   ____________________________________________  EKG  I personally interpreted any EKGs ordered by me or triage  ____________________________________________  RADIOLOGY  I reviewed any imaging ordered by me or triage that were performed during my shift and, if possible, patient and/or family made aware of any abnormal findings. ____________________________________________   PROCEDURES  Procedure(s) performed: None  Procedures  Critical Care performed: None  ____________________________________________   INITIAL IMPRESSION / ASSESSMENT AND  PLAN / ED COURSE  Pertinent labs & imaging results that were available during my care of the patient were reviewed by me and considered in my medical decision making (see chart for details).  patient here with behavioral dementia, long-standing, does not appear to be different from her baseline's far as I can tell, was [facility] emerg[facility] at [facility], unable to stay there because of behavior issues. She is here for that reason. Ms. Linard MillersMorrow -Jennings, her guardian asks me to keep her here until they can find placement. They think that will be tomorrow .  I will consult our sw.  Number for aps Ms. Linard MillersMorrow jennings is [phone] or [phone].  ----------------------------------------- 8:38 PM on 06/12/2017 -----------------------------------------  No acute medical issues identified. Wrote for home meds. sw paged but gone for day apparently. They will see her in the am. Signed out to dr.  Darnelle Catalanmalinda at the end o fmyshift.     ____________________________________________   FINAL CLINICAL IMPRESSION(S) / ED DIAGNOSES  Final diagnoses:  None      This chart was dictated using voice recognition software.  Despite best efforts to proofread,  errors can occur which can change meaning.      Jeanmarie PlantMcShane, Klaudia Beirne A, MD 06/12/17 1759    Jeanmarie PlantMcShane, Bearett Porcaro A, MD 06/12/17 2039

## 2017-06-12 NOTE — ED Notes (Signed)
Pt sleeping soundly with sitter at bedside

## 2017-06-12 NOTE — ED Triage Notes (Signed)
Pt to ED via EMS from [facility] with IVC paperwork for confusion and violent episode.  Per EMS patient was taken to [facility] today by social services and was confused where she was, asking for her daughter, and started banging on the wall.  [facility] called police and took out IVC paperwork.  Pt with a hx of dementia.  EMS vitals: 157/90 BP, 87 HR, 97% RA.

## 2017-06-12 NOTE — ED Notes (Signed)
Pt given jello and applesauce

## 2017-06-13 NOTE — ED Provider Notes (Signed)
-----------------------------------------   7:10 AM on 06/13/2017 -----------------------------------------  Patient was seen in the emergency department for worsening dementia with behavioral issues. Patient was sent from her nursing facility where she can no longer stay due to safety issues. Patient is medical workup has been largely nonrevealing, not meeting inpatient criteria however the patient does need to be placed in a social worker consult is pending today. No acute events overnight.   Minna AntisPaduchowski, Khaled Herda, MD 06/13/17 [phone]

## 2017-06-13 NOTE — Progress Notes (Addendum)
Clinical Child psychotherapistocial Worker (CSW) received a call from [place] DSS worker Kylie [phone] stating that DSS is patient's guardian. [facility] patient [facility] and she is working on placement at [facility]. [facility] will send one of their staff to assess patient on Monday 06/16/17 to see if they can accept her. At this time DSS has no other placement options for patient. FL2 and ALF PASARR complete. FL[facility].  DSS worker Jenel LucksKylie is going to bring paper work showing that patient had a TB skin test placed at [facility] on yesterday 06/12/17 to nurse. Nurse has agreed to read TB test within 48 hours of it being placed and documenting it in patient's chart.   [employer]iley Kentaro Alewine, LCSW [phone]

## 2017-06-13 NOTE — ED Notes (Signed)
Sitting with patient while sitter goes to lunch.

## 2017-06-13 NOTE — [place] FL2 (Signed)
[place] MEDICAID FL2 LEVEL OF CARE SCREENING TOOL     IDENTIFICATION  Patient Name: Yesenia Leonard Birthdate: 1924-08-31 Sex: female Admission Date (Current Location): 06/12/2017  [facility] [place]   Facility and Address:  [facility], [address], [place], [place] [address]      Provider Number: [id number]  Attending Physician Name and Address:  No att. providers found  Relative Name and Phone Number:       Current Level of Care: Hospital Recommended Level of Care: Assisted Living Facility Prior Approval Number:    Date Approved/Denied:   PASRR Number:  [id number] [id number])  Discharge Plan: Domiciliary (Rest home)    Current Diagnoses: Patient Active Problem List   Diagnosis Date Noted  . S/P ORIF (open reduction internal fixation) fracture 12/22/2016  . Hypotension 12/22/2016  . Acute posthemorrhagic anemia 12/22/2016  . H/O transfusion of packed red blood cells 12/22/2016  . Dizziness 12/22/2016  . Orthostatic hypotension 12/22/2016  . Thrombocytopenia (HCC) 12/22/2016  . Hip fracture (HCC) 12/19/2016  . Alzheimer's dementia 12/04/2016    Orientation RESPIRATION BLADDER Height & Weight     Self  Normal Continent Weight: 124 lb (56.2 kg) Height:     BEHAVIORAL SYMPTOMS/MOOD NEUROLOGICAL BOWEL NUTRITION STATUS      Continent Diet (Diet: Soft )  AMBULATORY STATUS COMMUNICATION OF NEEDS Skin   Supervision Verbally Normal                       Personal Care Assistance Level of Assistance  Bathing, Feeding, Dressing Bathing Assistance: Limited assistance Feeding assistance: Independent Dressing Assistance: Limited assistance     Functional Limitations Info  Sight, Hearing, Speech Sight Info: Adequate Hearing Info: Adequate Speech Info: Adequate    SPECIAL CARE FACTORS FREQUENCY                       Contractures      Additional Factors Info  Code Status, Allergies Code Status Info:  (Full  Code. ) Allergies Info:  (No Known Allergies. )           Current Medications (06/13/2017):  This is the current hospital active medication list Current Facility-Administered Medications  Medication Dose Route Frequency Provider Last Rate Last Dose  . aspirin chewable tablet 81 mg  81 mg Oral Daily Jeanmarie PlantMcShane, James A, MD   81 mg at 06/13/17 1049  . carvedilol (COREG) tablet 3.125 mg  3.125 mg Oral BID WC Jeanmarie PlantMcShane, James A, MD   3.125 mg at 06/13/17 0834  . latanoprost (XALATAN) 0.005 % ophthalmic solution 1 drop  1 drop Both Eyes QHS Jeanmarie PlantMcShane, James A, MD   1 drop at 06/13/17 0630  . LORazepam (ATIVAN) tablet 1 mg  1 mg Oral Q8H PRN Jeanmarie PlantMcShane, James A, MD   1 mg at 06/12/17 2047   Current Outpatient Prescriptions  Medication Sig Dispense Refill  . acetaminophen (TYLENOL) 500 MG tablet Take 500 mg by mouth every 6 (six) hours as needed.    Marland Kitchen. alendronate (FOSAMAX) 70 MG tablet Take 70 mg by mouth once a week. Take with a full glass of water on an empty stomach.    Marland Kitchen. aspirin 81 MG chewable tablet Chew 81 mg by mouth daily.    . carvedilol (COREG) 3.125 MG tablet Take 3.125 mg by mouth 2 (two) times daily with a meal.    . cholecalciferol (VITAMIN D) 1000 units tablet Take 1,000 Units by mouth  daily.    . donepezil (ARICEPT) 5 MG tablet Take 5 mg by mouth at bedtime.    Marland Kitchen. latanoprost (XALATAN) 0.005 % ophthalmic solution Place 1 drop into both eyes at bedtime.  5  . LORazepam (ATIVAN) 1 MG tablet Take 1 tablet (1 mg total) by mouth every 8 (eight) hours as needed (agitation). (Patient taking differently: Take 1 mg by mouth 2 (two) times daily as needed (agitation). ) 12 tablet 0  . mirtazapine (REMERON) 7.5 MG tablet Take 7.5 mg by mouth at bedtime.    . docusate sodium (COLACE) 100 MG capsule Take 1 capsule (100 mg total) by mouth 2 (two) times daily. (Patient not taking: Reported on 06/13/2017) 10 capsule 0  . enoxaparin (LOVENOX) 40 MG/0.4ML injection Inject 0.4 mLs (40 mg total) into the skin  daily. (Patient not taking: Reported on 06/13/2017) 0 Syringe   . ferrous sulfate 325 (65 FE) MG tablet Take 1 tablet (325 mg total) by mouth 3 (three) times daily with meals. (Patient not taking: Reported on 06/13/2017) 90 tablet 0  . HYDROcodone-acetaminophen (NORCO/VICODIN) 5-325 MG tablet Take 1 tablet by mouth every 6 (six) hours as needed for moderate pain. (Patient not taking: Reported on 06/13/2017) 12 tablet 0     Discharge Medications: Please see discharge summary for a list of discharge medications.  Relevant Imaging Results:  Relevant Lab Results:   Additional Information  (SSN: [id number])  Sample, [name], LCSW

## 2017-06-14 ENCOUNTER — Emergency Department: Payer: Medicare Other

## 2017-06-14 NOTE — ED Provider Notes (Signed)
-----------------------------------------   6:12 AM on 06/14/2017 -----------------------------------------   Blood pressure (!) 132/96, pulse 71, temperature 97.6 F (36.4 C), temperature source Oral, resp. rate 14, weight 56.2 kg (124 lb), SpO2 99 %.  The patient had no acute events since last update.  Calm and cooperative at this time.  Disposition is pending      Myrna BlazerSchaevitz, Bowdy Bair Matthew, MD 06/14/17 [phone]

## 2017-06-14 NOTE — ED Notes (Addendum)
Pt sleeping. Sitter remains at bedside.

## 2017-06-14 NOTE — ED Notes (Signed)
Sitters are changing shift. Sitter updated on plan of care. Pt continues to sit in wheelchair, denies pain or needs.

## 2017-06-14 NOTE — ED Notes (Signed)
TB Skin Test 2cm across, raised

## 2017-06-14 NOTE — ED Provider Notes (Signed)
TB PPD read as positive. Screening chest x-ray is negative. Continue to await placement.   Minna AntisPaduchowski, Deamber Buckhalter, MD 06/14/17 [phone]

## 2017-06-14 NOTE — ED Notes (Signed)
Sitter at bedside. Pt denies pain or needs currently. Pt is alert to self only. resps unlabored. Skin normal color warm and dry. Cap refill less than 3 seconds. 3+ radial pulse noted. Pt sitting in chair at bedside, denies need to urinate at this time. Pt consumed approx 50% of supper tray. Po fluids at bedside. Pt moving all extremities without difficulty.

## 2017-06-14 NOTE — ED Notes (Signed)
Patient wheeled to [facility] [place] for breakfast, consumed 75% of breakfast with Angie NT and this RN

## 2017-06-14 NOTE — ED Notes (Signed)
Pt placed on airborne precautions and sitter advised of precautions. Awaiting chest xray results for confirmative TB.

## 2017-06-14 NOTE — ED Notes (Signed)
MD aware of BP

## 2017-06-14 NOTE — ED Notes (Signed)
Patient ambulated with walker 350 feet without incident, stand by assist only.

## 2017-06-14 NOTE — ED Notes (Signed)
Pt sitting up in chair watching tv. Pt denies pain or needs currently. Sitter remains at bedside. Sitting to updated vital signs at 2300.

## 2017-06-15 MED ORDER — LORAZEPAM 2 MG/ML IJ SOLN
0.5000 mg | Freq: Once | INTRAMUSCULAR | Status: AC
  Administered 2017-06-15: 0.5 mg via INTRAVENOUS
  Filled 2017-06-15: qty 1

## 2017-06-15 MED ORDER — AMLODIPINE BESYLATE 5 MG PO TABS
ORAL_TABLET | ORAL | Status: AC
  Filled 2017-06-15: qty 1

## 2017-06-15 MED ORDER — AMLODIPINE BESYLATE 5 MG PO TABS
10.0000 mg | ORAL_TABLET | Freq: Once | ORAL | Status: AC
  Administered 2017-06-15: 10 mg via ORAL
  Filled 2017-06-15: qty 2

## 2017-06-15 MED ORDER — AMLODIPINE BESYLATE 5 MG PO TABS
10.0000 mg | ORAL_TABLET | Freq: Every day | ORAL | Status: DC
  Administered 2017-06-16: 10 mg via ORAL
  Filled 2017-06-15: qty 2

## 2017-06-15 NOTE — ED Notes (Signed)
Report to [place]

## 2017-06-15 NOTE — ED Provider Notes (Addendum)
-----------------------------------------   7:24 AM on 06/15/2017 -----------------------------------------  Awaiting nursing home disposition. No acute distress. Blood pressure asymptomatically elevated, she was given amlodipine. She used to be on amlodipine but apparently was stopped. We will see if that needs to be restarted continue to monitor.   Jeanmarie PlantMcShane, Bharat Antillon A, MD 06/15/17 [id number]    Jeanmarie PlantMcShane, Lyle Leisner A, MD 06/15/17 [phone]

## 2017-06-15 NOTE — ED Notes (Signed)
Pt continues to sleep, will recheck blood pressure at 0200.

## 2017-06-15 NOTE — ED Notes (Signed)
IVC/ AWAITING [facility]SE EVAL MONDAY

## 2017-06-15 NOTE — ED Notes (Signed)
Patient voices no complaints at this time.

## 2017-06-15 NOTE — ED Notes (Signed)
Pt sitting up watching tv. Sitter at bedside.

## 2017-06-15 NOTE — ED Notes (Signed)
Pt declines offer to brush teeth or change into gown for sleep. Sitter relieved.

## 2017-06-15 NOTE — ED Notes (Signed)
Pt and sitter moved to room 26

## 2017-06-15 NOTE — ED Notes (Signed)
Dr Zenda AlpersWebster notified of pt's elevated BP.

## 2017-06-15 NOTE — ED Notes (Signed)
INT to left hand flushed with 10ml of ns. No s/s of infiltration noted.

## 2017-06-15 NOTE — ED Notes (Signed)
Pt sleeping. Report received. Pt awaiting placement since Thursday for alz/dementia with anger outbursts.

## 2017-06-15 NOTE — ED Notes (Signed)
Pt continues to sleep, resps unlabored. Sitter remains at bedside.

## 2017-06-16 NOTE — ED Notes (Signed)
Pt breakfast tray given to sitter.

## 2017-06-16 NOTE — ED Notes (Signed)
[facility] called for update, number and reps name passed on to social work, voicemail left

## 2017-06-16 NOTE — ED Notes (Signed)
Report given to LuVerne at [facility], pt to be transported for discharged after 3 pm

## 2017-06-16 NOTE — ED Notes (Signed)
Pt given water 

## 2017-06-16 NOTE — ED Notes (Signed)
Laverene from [facility] at bedside for eval, sitter bonita at bedside

## 2017-06-16 NOTE — ED Notes (Addendum)
Pt resting in bed, sitter at bedside, awaiting EMS transport

## 2017-06-16 NOTE — ED Notes (Signed)
Pt given lunch tray, assisted to sit up in bed and eat

## 2017-06-16 NOTE — Progress Notes (Signed)
CSW spoke with [name] at [facility],   Patient will DC to: [facility] Anticipated DC date: 06/16/2017 at 3PM  Family notified: Myriam ForehandKailee Morrow-Jennings [facility](Guardian/[place] DSS)  [facility]  RN to call report to [phone] and ask for Laverne. Any narcotic medications will need to be hard prescriptions. RN notified.   Enos FlingAshley Georgia Baria, MSW, LCSW [facility] ED/15M Clinical Social Worker [phone]

## 2017-06-16 NOTE — ED Provider Notes (Signed)
-----------------------------------------   3:31 PM on 06/16/2017 -----------------------------------------   Blood pressure (!) 149/88, pulse 68, temperature 98.3 F (36.8 C), temperature source Oral, resp. rate 16, weight 56.2 kg (124 lb), SpO2 96 %.  The patient had no acute events since last update.  Calm and cooperative at this time.  Patient accepted by [facility]. Social worker confirmed with patient's legal guardian. Patient will be discharged at this time.   Myrna BlazerSchaevitz, David Matthew, MD 06/16/17 [phone]

## 2017-06-16 NOTE — ED Notes (Signed)
EMS at bedside for transport

## 2017-06-16 NOTE — ED Notes (Signed)
Called EMS for transport to [facility]   [phone]

## 2017-06-16 NOTE — ED Notes (Signed)
Pt with remains with a sitter at this time.

## 2017-06-16 NOTE — ED Notes (Signed)
Pt with 1:1 sitter at the bedside. Pt in hospital bed awaiting placement at this time.

## 2017-06-16 NOTE — ED Notes (Signed)
Safety sitter at bedside, aware of pending discharge to [facility]

## 2017-06-16 NOTE — ED Provider Notes (Signed)
-----------------------------------------   12:16 AM on 06/16/2017 -----------------------------------------   Blood pressure 127/82, pulse 70, temperature 97.6 F (36.4 C), temperature source Oral, resp. rate 16, weight 56.2 kg (124 lb), SpO2 100 %.  The patient had no acute events since last update.  Calm and cooperative at this time.  Disposition is pending Psychiatry/Behavioral Medicine team recommendations.     Merrily Brittleifenbark, Elvie Maines, MD 06/16/17 [phone]

## 2017-06-16 NOTE — ED Notes (Signed)
Pt unable to sign for discharge, pt has dementia, verified with Morrie SheldonAshley from Social Work that legal guardian has been notified

## 2017-06-16 NOTE — ED Notes (Signed)
Rep from [facility] to come and assess pt for possible placement.

## 2017-06-16 NOTE — Progress Notes (Signed)
CSW has confirmed with Patient's legal guardian, Myriam ForehandKailee Morrow-Jennings [facility](Guardian/[place] DSS), that Patient is to discharge to [facility]. No questions or concerns identified.    Enos FlingAshley Yeilin Zweber, MSW, LCSW [facility] Clinical Social Worker [phone]

## 2017-06-16 NOTE — ED Notes (Signed)
Pt refused VS stating that "I want to wait for my doctor to have any new medicine". Pt had VS WDL at 1800.

## 2017-06-18 ENCOUNTER — Emergency Department
Admission: EM | Admit: 2017-06-18 | Discharge: 2017-06-22 | Disposition: A | Discharge status: Skilled Nursing Facility | Payer: Medicare Other | Attending: Emergency Medicine | Admitting: Emergency Medicine

## 2017-06-18 ENCOUNTER — Emergency Department: Payer: Medicare Other

## 2017-06-18 DIAGNOSIS — G301 Alzheimer's disease with late onset: Secondary | ICD-10-CM | POA: Diagnosis not present

## 2017-06-18 DIAGNOSIS — F0280 Dementia in other diseases classified elsewhere without behavioral disturbance: Secondary | ICD-10-CM | POA: Diagnosis present

## 2017-06-18 DIAGNOSIS — I10 Essential (primary) hypertension: Secondary | ICD-10-CM | POA: Diagnosis not present

## 2017-06-18 DIAGNOSIS — F0391 Unspecified dementia with behavioral disturbance: Secondary | ICD-10-CM

## 2017-06-18 DIAGNOSIS — G309 Alzheimer's disease, unspecified: Secondary | ICD-10-CM

## 2017-06-18 DIAGNOSIS — R4182 Altered mental status, unspecified: Secondary | ICD-10-CM | POA: Diagnosis present

## 2017-06-18 LAB — CBC WITH DIFFERENTIAL/PLATELET
BASOS ABS: 0 10*3/uL (ref 0–0.1)
BASOS PCT: 1 %
Eosinophils Absolute: 0.1 10*3/uL (ref 0–0.7)
Eosinophils Relative: 1 %
HCT: 43.6 % (ref 35.0–47.0)
Hemoglobin: 14.6 g/dL (ref 12.0–16.0)
LYMPHS PCT: 13 %
Lymphs Abs: 1 10*3/uL (ref 1.0–3.6)
MCH: 29.5 pg (ref 26.0–34.0)
MCHC: 33.4 g/dL (ref 32.0–36.0)
MCV: 88.3 fL (ref 80.0–100.0)
Monocytes Absolute: 0.4 10*3/uL (ref 0.2–0.9)
Monocytes Relative: 6 %
NEUTROS ABS: 6.1 10*3/uL (ref 1.4–6.5)
NEUTROS PCT: 79 %
Platelets: 159 10*3/uL (ref 150–440)
RBC: 4.94 MIL/uL (ref 3.80–5.20)
RDW: 13.5 % (ref 11.5–14.5)
WBC: 7.7 10*3/uL (ref 3.6–11.0)

## 2017-06-18 LAB — COMPREHENSIVE METABOLIC PANEL
ALBUMIN: 4 g/dL (ref 3.5–5.0)
ALT: 12 U/L — ABNORMAL LOW (ref 14–54)
ANION GAP: 6 (ref 5–15)
AST: 30 U/L (ref 15–41)
Alkaline Phosphatase: 79 U/L (ref 38–126)
BUN: 15 mg/dL (ref 6–20)
CO2: 28 mmol/L (ref 22–32)
Calcium: 11.1 mg/dL — ABNORMAL HIGH (ref 8.9–10.3)
Chloride: 109 mmol/L (ref 101–111)
Creatinine, Ser: 0.79 mg/dL (ref 0.44–1.00)
GFR calc Af Amer: >60 mL/min (ref >60)
GFR calc non Af Amer: >60 mL/min (ref >60)
GLUCOSE: 208 mg/dL — AB (ref 65–99)
POTASSIUM: 3.3 mmol/L — AB (ref 3.5–5.1)
SODIUM: 143 mmol/L (ref 135–145)
TOTAL PROTEIN: 7 g/dL (ref 6.5–8.1)
Total Bilirubin: 1 mg/dL (ref 0.3–1.2)

## 2017-06-18 LAB — URINALYSIS, COMPLETE (UACMP) WITH MICROSCOPIC
BACTERIA UA: NONE SEEN
BILIRUBIN URINE: NEGATIVE
GLUCOSE, UA: 50 mg/dL — AB
Ketones, ur: NEGATIVE mg/dL
LEUKOCYTES UA: NEGATIVE
NITRITE: NEGATIVE
Protein, ur: 100 mg/dL — AB
SPECIFIC GRAVITY, URINE: 1.018 (ref 1.005–1.030)
pH: 5 (ref 5.0–8.0)

## 2017-06-18 LAB — TROPONIN I: Troponin I: <0.03 ng/mL (ref <0.03)

## 2017-06-18 MED ORDER — LORAZEPAM 2 MG/ML IJ SOLN
INTRAMUSCULAR | Status: AC
  Administered 2017-06-18: 2 mg via INTRAMUSCULAR
  Filled 2017-06-18: qty 1

## 2017-06-18 MED ORDER — DIPHENHYDRAMINE HCL 50 MG/ML IJ SOLN
INTRAMUSCULAR | Status: AC
  Administered 2017-06-18: 50 mg via INTRAMUSCULAR
  Filled 2017-06-18: qty 1

## 2017-06-18 MED ORDER — HALOPERIDOL LACTATE 5 MG/ML IJ SOLN
5.0000 mg | Freq: Once | INTRAMUSCULAR | Status: AC
  Administered 2017-06-18: 5 mg via INTRAMUSCULAR

## 2017-06-18 MED ORDER — DIPHENHYDRAMINE HCL 50 MG/ML IJ SOLN
50.0000 mg | Freq: Once | INTRAMUSCULAR | Status: AC
  Administered 2017-06-18: 50 mg via INTRAMUSCULAR

## 2017-06-18 MED ORDER — LORAZEPAM 2 MG/ML IJ SOLN
2.0000 mg | Freq: Once | INTRAMUSCULAR | Status: AC
  Administered 2017-06-18: 2 mg via INTRAMUSCULAR

## 2017-06-18 MED ORDER — HALOPERIDOL LACTATE 5 MG/ML IJ SOLN
INTRAMUSCULAR | Status: AC
  Administered 2017-06-18: 5 mg via INTRAMUSCULAR
  Filled 2017-06-18: qty 1

## 2017-06-18 NOTE — ED Notes (Signed)
Pt is calm and allows RN to perform EKG and place tourniquet. RN able to collect blood work and pt did not Event organiserfight or yell at staff. Pt calm throughout blood collection

## 2017-06-18 NOTE — ED Notes (Signed)
PT sleeping when note spoken to by staff but when staff talks to pt or attempted to place ID bracelet on pt pt attempted to hit staff and yelled for supper to be cooked. PT then fell back to sleep when staff stepped away.

## 2017-06-18 NOTE — ED Provider Notes (Signed)
[facility] Emergency Department Provider Note       Time seen: ----------------------------------------- 8:57 PM on 06/18/2017 -----------------------------------------  Level V caveat: History/ROS limited by altered mental status   I have reviewed the triage vital signs and the nursing notes.   HISTORY   Chief Complaint Altered Mental Status    HPI Yesenia Leonard is a 83 y.o. female who presents to the ED for agitation. Patient presents from [facility] with agitation and a request from the residence for a psychiatric evaluation. She has been very combative after a possible syncopal event today. No further information is available.   Past Medical History:  Diagnosis Date  . Dementia   . Hypercalcemia   . Hypertension   . Hypokalemia   . Osteoporosis   . PPD positive   . Pre-diabetes     Patient Active Problem List   Diagnosis Date Noted  . S/P ORIF (open reduction internal fixation) fracture 12/22/2016  . Hypotension 12/22/2016  . Acute posthemorrhagic anemia 12/22/2016  . H/O transfusion of packed red blood cells 12/22/2016  . Dizziness 12/22/2016  . Orthostatic hypotension 12/22/2016  . Thrombocytopenia (HCC) 12/22/2016  . Hip fracture (HCC) 12/19/2016  . Alzheimer's dementia 12/04/2016    Past Surgical History:  Procedure Laterality Date  . APPENDECTOMY    . INTRAMEDULLARY (IM) NAIL INTERTROCHANTERIC Right 12/19/2016   Procedure: INTRAMEDULLARY (IM) NAIL INTERTROCHANTRIC;  Surgeon: Christena FlakeJohn J Poggi, MD;  Location: [facility] ORS;  Service: Orthopedics;  Laterality: Right;    Allergies Patient has no known allergies.  Social History Social History  Substance Use Topics  . Smoking status: Never Smoker  . Smokeless tobacco: Never Used  . Alcohol use No    Review of Systems Unknown at this time  All systems negative/normal/unremarkable except as stated in the HPI  ____________________________________________   PHYSICAL  EXAM:  VITAL SIGNS: ED Triage Vitals [06/18/17 2053]  Enc Vitals Group     BP      Pulse      Resp      Temp      Temp src      SpO2      Weight 124 lb (56.2 kg)     Height      Head Circumference      Peak Flow      Pain Score      Pain Loc      Pain Edu?      Excl. in GC?     Constitutional: Alert But agitated and combative. Patient appears disoriented Eyes: Conjunctivae are normal. Normal extraocular movements. ENT   Head: Normocephalic and atraumatic.   Nose: No congestion/rhinnorhea.   Mouth/Throat: Mucous membranes are moist.   Neck: No stridor. Cardiovascular: Rapid rate, regular rhythm. No murmurs, rubs, or gallops. Respiratory: Normal respiratory effort without tachypnea nor retractions. Breath sounds are clear and equal bilaterally. No wheezes/rales/rhonchi. Gastrointestinal: Soft and nontender. Normal bowel sounds Musculoskeletal: Nontender with normal range of motion in extremities. No lower extremity tenderness nor edema. Neurologic:  Normal speech and language. No gross focal neurologic deficits are appreciated. Patient is moving her arms and legs equally and well Skin:  Skin is warm, dry and intact. No rash noted. Psychiatric: Agitated mood and affect ____________________________________________  EKG: Interpreted by me.Sinus tachycardia at a rate of 129 bpm, normal PR interval, normal QRS, normal QT. Repolarization abnormality.  ____________________________________________  ED COURSE:  Pertinent labs & imaging results that were available during my care of the patient were  reviewed by me and considered in my medical decision making (see chart for details). Patient presents for altered mental status and agitation, we will assess with labs and imaging as indicated.   Procedures ____________________________________________   LABS (pertinent positives/negatives)  Labs Reviewed  COMPREHENSIVE METABOLIC PANEL - Abnormal; Notable for the  following:       Result Value   Potassium 3.3 (*)    Glucose, Bld 208 (*)    Calcium 11.1 (*)    ALT 12 (*)    All other components within normal limits  URINALYSIS, COMPLETE (UACMP) WITH MICROSCOPIC - Abnormal; Notable for the following:    Color, Urine YELLOW (*)    APPearance CLEAR (*)    Glucose, UA 50 (*)    Hgb urine dipstick MODERATE (*)    Protein, ur 100 (*)    Squamous Epithelial / LPF 0-5 (*)    All other components within normal limits  CBC WITH DIFFERENTIAL/PLATELET  TROPONIN I    RADIOLOGY Images were viewed by me  CT head  IMPRESSION: No acute intracranial abnormalities. Chronic atrophy and small vessel ischemic changes. Right mastoid effusion. ____________________________________________  FINAL ASSESSMENT AND PLAN  Agitation, dementia  Plan: Patient's labs and imaging were dictated above. Patient had presented for behavioral disturbance likely secondary to dementia. She did require sedation to evaluate her including Ativan, Benadryl and Haldol.She appears medically stable for psychiatric evaluation and disposition. She will likely need transfer to a geriatric psychiatric facility.   Emily FilbertWilliams, Jonathan E, MD   Note: This note was generated in part or whole with voice recognition software. Voice recognition is usually quite accurate but there are transcription errors that can and very often do occur. I apologize for any typographical errors that were not detected and corrected.     Emily FilbertWilliams, Jonathan E, MD 06/18/17 2236

## 2017-06-18 NOTE — ED Notes (Signed)
TRIAGE NOTE: Pt arrived to ED yelling at, biting, kicking and hitting staff and EMS. EMS reported not knowing why pt was sent to ED other than for combative behavior. RN has a phone call from facility prior to EMS arrival but was unable to answer call. PT arrived to ED confused and yelling at staff for multiple things including, spiders, family members that had passed away and church. PT would not allow staff to take shoes off or apply blood pressure cuff without yelling and attempts at hitting staff.

## 2017-06-18 NOTE — ED Notes (Signed)
RN called [facility], unable to get caregiver on phone to answer why pt was sent to ED.

## 2017-06-18 NOTE — ED Notes (Signed)
Pt remains calm. All blood and urine has been collected. Pt moved to quad. RN, MD, and security made aware.

## 2017-06-18 NOTE — ED Notes (Signed)
Pt dressed in paper scrubs. Belongings in belongings bag at bedside. Pt in yellow socks and has a yellow fall risk bracelet on. Pt is not attempting to get out of bed. Door open and pt in eyesight of staff.

## 2017-06-18 NOTE — ED Notes (Signed)
Staff from [facility] has arrived to room and update RN that pt has been out of control since returning to facility on Monday. Staff reports pt has not slept, refuses to take medications and has been hitting the walls and throwing walkers at staff. Facility obtained verbal order to give ativan gel today and report pt was calm for about 3 hours before becoming agitated and combative again. Staff reports pt unsafe for the other residence.

## 2017-06-19 DIAGNOSIS — G301 Alzheimer's disease with late onset: Secondary | ICD-10-CM | POA: Diagnosis not present

## 2017-06-19 DIAGNOSIS — F0280 Dementia in other diseases classified elsewhere without behavioral disturbance: Secondary | ICD-10-CM

## 2017-06-19 MED ORDER — CARVEDILOL 6.25 MG PO TABS
3.1250 mg | ORAL_TABLET | Freq: Two times a day (BID) | ORAL | Status: DC
  Administered 2017-06-20 – 2017-06-22 (×4): 3.125 mg via ORAL
  Filled 2017-06-19 (×4): qty 1

## 2017-06-19 MED ORDER — OLANZAPINE 5 MG PO TBDP: 5.0000 mg | ORAL_TABLET | Freq: Every day | ORAL | Status: DC

## 2017-06-19 MED ORDER — VITAMIN D 1000 UNITS PO TABS
1000.0000 [IU] | ORAL_TABLET | Freq: Every day | ORAL | Status: DC
  Administered 2017-06-20 – 2017-06-22 (×2): 1000 [IU] via ORAL
  Filled 2017-06-19 (×2): qty 1

## 2017-06-19 MED ORDER — OLANZAPINE 5 MG PO TBDP
2.5000 mg | ORAL_TABLET | Freq: Every day | ORAL | Status: DC
  Administered 2017-06-20: 2.5 mg via ORAL
  Filled 2017-06-19 (×2): qty 1

## 2017-06-19 MED ORDER — LATANOPROST 0.005 % OP SOLN
1.0000 [drp] | Freq: Every day | OPHTHALMIC | Status: DC
  Administered 2017-06-19 – 2017-06-21 (×3): 1 [drp] via OPHTHALMIC
  Filled 2017-06-19: qty 2.5

## 2017-06-19 MED ORDER — OLANZAPINE 5 MG PO TBDP
10.0000 mg | ORAL_TABLET | Freq: Every day | ORAL | Status: DC
  Administered 2017-06-19 – 2017-06-20 (×2): 10 mg via ORAL
  Filled 2017-06-19 (×2): qty 2

## 2017-06-19 MED ORDER — LORAZEPAM 2 MG/ML IJ SOLN
INTRAMUSCULAR | Status: AC
  Filled 2017-06-19: qty 1

## 2017-06-19 MED ORDER — LORAZEPAM 2 MG/ML IJ SOLN
2.0000 mg | Freq: Once | INTRAMUSCULAR | Status: AC
  Administered 2017-06-19: 2 mg via INTRAMUSCULAR

## 2017-06-19 MED ORDER — MIRTAZAPINE 15 MG PO TBDP
15.0000 mg | ORAL_TABLET | Freq: Every day | ORAL | Status: DC
  Administered 2017-06-19 – 2017-06-21 (×3): 15 mg via ORAL
  Filled 2017-06-19 (×5): qty 1

## 2017-06-19 MED ORDER — DOCUSATE SODIUM 100 MG PO CAPS
100.0000 mg | ORAL_CAPSULE | Freq: Two times a day (BID) | ORAL | Status: DC
  Administered 2017-06-19 – 2017-06-22 (×5): 100 mg via ORAL
  Filled 2017-06-19 (×5): qty 1

## 2017-06-19 MED ORDER — ASPIRIN 81 MG PO CHEW
81.0000 mg | CHEWABLE_TABLET | Freq: Every day | ORAL | Status: DC
  Administered 2017-06-20 – 2017-06-22 (×2): 81 mg via ORAL
  Filled 2017-06-19 (×2): qty 1

## 2017-06-19 NOTE — ED Notes (Signed)
BEHAVIORAL HEALTH ROUNDING Patient sleeping: No. Patient alert and oriented: yes Behavior appropriate: Yes.  ; If no, describe:  Nutrition and fluids offered: yes Toileting and hygiene offered: Yes  Sitter present: q15 minute observations and security  monitoring Law enforcement present: Yes  ODS  

## 2017-06-19 NOTE — ED Notes (Signed)
BEHAVIORAL HEALTH ROUNDING Patient sleeping: No. Patient alert  Behavior appropriate:  Dementia  ; If no, describe:  Nutrition and fluids offered: yes Toileting and hygiene offered: Yes  Sitter present: q15 minute observations and security monitoring Law enforcement present: Yes  ODS

## 2017-06-19 NOTE — BH Assessment (Signed)
Tele Assessment Note   Yesenia Leonard is an 83 y.o. female presenting per [facility]. Per pt chart pt has been agitated and combative. Pt was combative upon initially presenting to ED and required IM medications. Upon interview, pt is calm and calm however, is not oriented. Information gathering is limited by pt's presentation/mental status.   Pt has DSS guardian [phone]).  Dr.Clapacs states that he attempted to contact guardian for collateral and left voice message with his pager number..   Diagnosis: Dementia  Past Medical History:  Past Medical History:  Diagnosis Date  . Dementia   . Hypercalcemia   . Hypertension   . Hypokalemia   . Osteoporosis   . PPD positive   . Pre-diabetes     Past Surgical History:  Procedure Laterality Date  . APPENDECTOMY    . INTRAMEDULLARY (IM) NAIL INTERTROCHANTERIC Right 12/19/2016   Procedure: INTRAMEDULLARY (IM) NAIL INTERTROCHANTRIC;  Surgeon: Christena FlakeJohn J Poggi, MD;  Location: [facility] ORS;  Service: Orthopedics;  Laterality: Right;    Family History:  Family History  Problem Relation Age of Onset  . Hypertension Mother     Social History:  reports that she has never smoked. She has never used smokeless tobacco. She reports that she does not drink alcohol or use drugs.  Additional Social History:  Alcohol / Drug Use Pain Medications: UTA due to pt presentation/mental status Prescriptions: UTA due to pt presentation/mental status Over the Counter: UTA due to pt presentation/mental status History of alcohol / drug use?: No history of alcohol / drug abuse  CIWA: CIWA-Ar BP: 120/78 Pulse Rate: 77 COWS:    PATIENT STRENGTHS: (choose at least two) Average or above average intelligence  Allergies: No Known Allergies  Home Medications:  (Not in a hospital admission)  OB/GYN Status:  No LMP recorded. Patient is postmenopausal.  General Assessment Data Location of Assessment[facility] ED TTS Assessment: In system Is this a Tele or  Face-to-Face Assessment?: Face-to-Face Is this an Initial Assessment or a Re-assessment for this encounter?: Initial Assessment Marital status:  (UTA due to pt presentation/mental status) Juanell FairlyMaiden name:  (UTA due to pt presentation/mental status) Is patient pregnant?: No Pregnancy Status: No Living Arrangements: Other (Comment) Air cabin crew([facility]) Can pt return to current living arrangement?: Yes Admission Status: Voluntary Is patient capable of signing voluntary admission?: No Referral Source: Other Armed forces operational officer([facility]) Insurance type: Medicare     Crisis Care Plan Living Arrangements: Other (Comment) Air cabin crew( House) Name of Psychiatrist: UTA due to pt mental status/presentation Name of Therapist: UTA due to pt mental status/presentation  Education Status Is patient currently in school?: No Highest grade of school patient has completed:  (UTA due to pt mental status/presentation)  Risk to self with the past 6 months Suicidal Ideation: No Has patient been a risk to self within the past 6 months prior to admission? :  (UTA due to pt mental status/presentation) Suicidal Intent: No Has patient had any suicidal intent within the past 6 months prior to admission? :  (UTA due to pt mental status/presentation) Is patient at risk for suicide?: No Suicidal Plan?: No Has patient had any suicidal plan within the past 6 months prior to admission? :  (UTA due to pt mental status/presentation) Access to Means:  (UTA due to pt mental status/presentation) What has been your use of drugs/alcohol within the last 12 months?: UTA due to pt mental status/presentation Previous Attempts/Gestures:  (UTA due to pt mental status/presentation) How many times?:  (UTA due to pt mental status/presentation)  Intentional Self Injurious Behavior:  (UTA due to pt mental status/presentation) Family Suicide History: Unable to assess Recent stressful life event(s):  (UTA due to pt mental status/presentation) Persecutory  voices/beliefs?:  (UTA due to pt mental status/presentation) Depression:  (UTA due to pt mental status/presentation) Depression Symptoms:  (UTA due to pt mental status/presentation) Substance abuse history and/or treatment for substance abuse?:  (UTA due to pt mental status/presentation) Suicide prevention information given to non-admitted patients: Not applicable  Risk to Others within the past 6 months Homicidal Ideation: No Does patient have any lifetime risk of violence toward others beyond the six months prior to admission? : Unknown Thoughts of Harm to Others:  (UTA due to pt mental status/presentation) Current Homicidal Intent: No Current Homicidal Plan: No Access to Homicidal Means:  (UTA due to pt mental status/presentation) Does patient have access to weapons?:  (UTA due to pt mental status/presentation) Criminal Charges Pending?:  (UTA due to pt mental status/presentation) Does patient have a court date:  (UTA due to pt mental status/presentation) Is patient on probation?: Unknown  Psychosis Hallucinations:  (UTA due to pt mental status/presentation) Delusions:  (UTA due to pt mental status/presentation)  Mental Status Report Appearance/Hygiene: In scrubs Eye Contact: Fair Motor Activity: Unsteady Speech: Soft Mood: Euthymic Affect: Constricted Anxiety Level: None Thought Processes: Irrelevant Judgement: Impaired Orientation: Not oriented Obsessive Compulsive Thoughts/Behaviors: None  Cognitive Functioning Concentration: Decreased Memory: Recent Impaired, Remote Impaired IQ: Average Insight: Poor Impulse Control: Fair Appetite:  (UTA due to pt mental status/presentation) Weight Loss:  (UTA due to pt mental status/presentation) Weight Gain:  (UTA due to pt mental status/presentation) Sleep: Unable to Assess Total Hours of Sleep:  (UTA due to pt mental status/presentation) Vegetative Symptoms: Unable to Assess  ADLScreening [facility] Assessment Services) Patient's  cognitive ability adequate to safely complete daily activities?: Yes Patient able to express need for assistance with ADLs?: Yes Independently performs ADLs?: No (Per chart, UTA due to pt presentation/mental status)  Prior Inpatient Therapy Prior Inpatient Therapy:  (UTA due to pt mental status/presentation)  Prior Outpatient Therapy Prior Outpatient Therapy:  (UTA due to pt mental status/presentation) Does patient have an ACCT team?: No Does patient have Intensive In-House Services?  : No Does patient have [facility] services? : No Does patient have P4CC services?: No  ADL Screening (condition at time of admission) Patient's cognitive ability adequate to safely complete daily activities?: Yes Is the patient deaf or have difficulty hearing?: Yes (hearing difficulty) Does the patient have difficulty seeing, even when wearing glasses/contacts?:  (Not Reported) Does the patient have difficulty concentrating, remembering, or making decisions?: Yes Patient able to express need for assistance with ADLs?: Yes Independently performs ADLs?: No (Per chart, UTA due to pt presentation/mental status)  Home Assistive Devices/Equipment Home Assistive Devices/Equipment:  (UTA due to pt presentation/mental status. None Observed)  Therapy Consults (therapy consults require a physician order) PT Evaluation Needed: No OT Evalulation Needed: No SLP Evaluation Needed: No Abuse/Neglect Assessment (Assessment to be complete while patient is alone) Physical Abuse:  (UTA due to pt presentation/mental status) Verbal Abuse:  (UTA due to pt presentation/mental status) Sexual Abuse:  (UTA due to pt presentation/mental status) Exploitation of patient/patient's resources:  (UTA due to pt presentation/mental status) Self-Neglect:  (UTA due to pt presentation/mental status) Values / Beliefs Cultural Requests During Hospitalization: None Spiritual Requests During Hospitalization: None Consults Spiritual Care  Consult Needed: No Social Work Consult Needed: No Merchant navy officer (For Healthcare) Does Patient Have a Medical Advance Directive?: No Would patient like  information on creating a medical advance directive?: No - Patient declined    Additional Information 1:1 In Past 12 Months?: No CIRT Risk: No Elopement Risk: No Does patient have medical clearance?: Yes     Disposition:  Disposition Initial Assessment Completed for this Encounter: Yes Disposition of Patient: Other dispositions Other disposition(s): Other (Comment) (Pt recommended for d/c back to care home per Dr.Clapacs)  Syrianna Schillaci J [place] 06/19/2017 2:38 PM

## 2017-06-19 NOTE — ED Notes (Signed)
Dr.clapaac in room talking with patient 

## 2017-06-19 NOTE — Progress Notes (Signed)
CSW received phone call from Tiffany- assigned social worker with [facility] [employer] [facility] [phone] requesting that Psychiatrist give her a call. Tiffany clarified that Patient's guardian is [facility]. CSW will staff with MD.       Enos FlingAshley Chima Astorino, MSW, LCSW [facility] Clinical Social Worker [phone]

## 2017-06-19 NOTE — ED Notes (Signed)
Yesenia Leonard from social work called to inform me that DSS is this pts legal guardian - someone named Yesenia Leonard today

## 2017-06-19 NOTE — ED Provider Notes (Signed)
The patient is beginning to resist staff, becoming agitated, yelling out, refusing to take medications. Yesterday she had received Haldol Ativan and Benadryl.  I will provide intramuscular Ativan at this time and continue to monitor closely. She is showing evidence of increasing agitation and does need at least some sedation to improve symptomatology at this time.   Evanne Matsunaga,Sharyn Creamer MD 06/19/17 [phone]

## 2017-06-19 NOTE — ED Notes (Signed)
Pt sleeping. Rise and fall of chest noted

## 2017-06-19 NOTE — Discharge Instructions (Addendum)
Continue your medicines return as needed.

## 2017-06-19 NOTE — BH Assessment (Signed)
Patient was administered IM medications for agitation and aggressive earlier.  Patient is asleep.  TTS unable to complete consult.

## 2017-06-19 NOTE — ED Notes (Signed)
Patient changed and dried.

## 2017-06-19 NOTE — ED Notes (Signed)
PT VOLUNTARY PENDING D/C BACK TO [facility] AFTER OBSERVATION ON NEW MEDICATION.

## 2017-06-19 NOTE — ED Notes (Signed)
Pt could be heard yelling from her room - she had slid to the bottom of the bed and was asking  "Where's my momma?  I attempted to reorient  "Where is Hattie ?  That is my mommas name"   Pt reoriented to today's date  - her age - and that [facility] is at [facility]  "Do you have snakes in here?  I reassured her -NO   Bed alarm placed on bed assisted her with repositioning   No aggression observed

## 2017-06-19 NOTE — ED Notes (Signed)

## 2017-06-19 NOTE — ED Notes (Signed)
BEHAVIORAL HEALTH ROUNDING Patient sleeping: Yes.   Patient alert and oriented: eyes closed  Appears to be asleep Behavior appropriate: Yes.  ; If no, describe:  Nutrition and fluids offered: Yes  Toileting and hygiene offered: sleeping Sitter present: q 15 minute observations and security monitoring Law enforcement present: yes  ODS 

## 2017-06-19 NOTE — ED Provider Notes (Signed)
-----------------------------------------   12:03 AM on 06/19/2017 -----------------------------------------   Blood pressure 115/82, pulse (!) 115, temperature 98.2 F (36.8 C), resp. rate 16, weight 56.2 kg (124 lb), SpO2 99 %.  The patient had no acute events since last update.  Calm and cooperative at this time.  Disposition is pending Psychiatry/Behavioral Medicine team recommendations.     Willy Eddyobinson, Clayten Allcock, MD 06/19/17 Ivor Reining0003

## 2017-06-19 NOTE — ED Notes (Signed)
ED Is the patient under IVC or is there intent for IVC: Yes.   Is the patient medically cleared: Yes.   Is there vacancy in the ED BHU: Yes.   Is the population mix appropriate for patient:  geriatric Is the patient awaiting placement in inpatient or outpatient setting: Yes.   Has the patient had a psychiatric consult:  Pending   Survey of unit performed for contraband, proper placement and condition of furniture, tampering with fixtures in bathroom, shower, and each patient room: Yes.  ; Findings:  APPEARANCE/BEHAVIOR Calm and cooperative NEURO ASSESSMENT Orientation: oriented to self   Denies pain Hallucinations: No.None noted (Hallucinations) Speech: Normal Gait: normal - unsteady at times  assistance provided   RESPIRATORY ASSESSMENT Even  Unlabored respirations  CARDIOVASCULAR ASSESSMENT Pulses equal   regular rate  Skin warm and dry   GASTROINTESTINAL ASSESSMENT no GI complaint EXTREMITIES Full ROM  PLAN OF CARE Provide calm/safe environment. Vital signs assessed twice daily. ED BHU Assessment once each 12-hour shift. Collaborate with TTS daily or as condition indicates. Assure the ED provider has rounded once each shift. Provide and encourage hygiene. Provide redirection as needed. Assess for escalating behavior; address immediately and inform ED provider.  Assess family dynamic and appropriateness for visitation as needed: Yes.  ; If necessary, describe findings:  Educate the patient/family about BHU procedures/visitation: Yes.  ; If necessary, describe findings:

## 2017-06-19 NOTE — ED Provider Notes (Signed)
-----------------------------------------   12:03 PM on 06/19/2017 -----------------------------------------   Blood pressure 115/82, pulse (!) 115, temperature 98.2 F (36.8 C), resp. rate 16, weight 56.2 kg (124 lb), SpO2 99 %.  The patient had no acute events since last update.  Calm and cooperative at this time.  Disposition is pending Psychiatry/Behavioral Medicine team recommendations.     Arnaldo NatalMalinda, Gwendloyn Forsee F, MD 06/19/17 [phone]

## 2017-06-19 NOTE — ED Notes (Signed)
Pt observed lying in bed  - eyes closed     Pt visualized with NAD  No verbalized needs or concerns at this time  Continue to monitor

## 2017-06-19 NOTE — ED Notes (Signed)
Patient in room resting at this time, breakfast placed in room.

## 2017-06-19 NOTE — ED Notes (Signed)
Lunch placed in room, patient in room sleeping

## 2017-06-19 NOTE — ED Notes (Signed)
She has been resting quietly - IVC papers have been rescinded - pt to discharge back to [facility]   Social work to help arrange transfer

## 2017-06-19 NOTE — ED Notes (Signed)
She has been assisted to the BR - she is yelling out - "Y'all got this all wrong  - I am Yesenia Leonard- my dad is a Programmer, multimediapreacher - go get my mother "    She is not easily reoriented Med to be administered

## 2017-06-19 NOTE — ED Notes (Signed)
Pts bed was cleaned after pt used the bathroom in the bed. A clean diaper was placed on the pt.

## 2017-06-19 NOTE — Progress Notes (Signed)
CSW contacted Tiffany with  assigned social worker with [facility] [place] [facility] of Social [facility] [phone]). CSW informed Tiffany that per our psychiatrist, Patient does not meet commitment criteria and really does not need psychiatric treatment so much as appropriate placement. CSW also explained that MD had also made some medication adjustments. Tiffany reports that Patient is the Memory Care unit at [facility] and is unclear as to what would be a more appropriate placement as the next level of care would be skilled nursing facility placement and then hospice. Per Elmarie Shileyiffany, Patient can return to [facility]. CSW is to contact [facility] to confirm this. Tiffany reports that she has also paged Psychiatrist and is awaiting a return phone call. CSW continues to follow.    Enos FlingAshley Jovita Persing, MSW, LCSW [facility] Clinical Social Worker [phone]

## 2017-06-19 NOTE — Progress Notes (Signed)
CSW contacted Conchita Parisindy Rehmeyer, Librarian, academicxecutive Director of [facility] regarding Patient having been medically and psychiatrically cleared for discharge. CSW explained that MD has made some medication adjustments. Arline AspCindy reports that the medical team on last night told them that Patient would be going to [place]. Ms. Johnette AbrahamRehmeyer unable to inform CSW of who told her that. CSW informed Ms. Rehmeyer that per MD, Here in the emergency room she is not showing any other treatable symptoms of mental illness. Patient is not going to benefit from a geriatric psychiatry unit. Patient does not meet commitment criteria and really does not need psychiatric treatment. Ms. Johnette AbrahamRehmeyer is requesting that Patient be observed on the low dose of Zyprexa prior to discharge back to facility. Ms. Johnette AbrahamRehmeyer reports that they are willing to take the patient back once she has been observed on new medications. Ms. Johnette AbrahamRehmeyer requesting to speak to psychiatrist and explain her concerns. CSW will reach out to Dr. Toni Amendlapacs regarding facilities concerns.    Enos FlingAshley Parisa Pinela, MSW, LCSW [facility] Clinical Social Worker [phone]

## 2017-06-19 NOTE — Consult Note (Signed)
[facility] Consult   Reason for Consult:  Consult for 83 year old woman with a history of dementia Referring Physician:  Cinda Quest Patient Identification: Yesenia Leonard MRN:  [id number] Principal Diagnosis: Alzheimer's dementia Diagnosis:   Patient Active Problem List   Diagnosis Date Noted  . S/P ORIF (open reduction internal fixation) fracture [Z96.7, Z87.81] 12/22/2016  . Hypotension [I95.9] 12/22/2016  . Acute posthemorrhagic anemia [D62] 12/22/2016  . H/O transfusion of packed red blood cells [Z92.89] 12/22/2016  . Dizziness [R42] 12/22/2016  . Orthostatic hypotension [I95.1] 12/22/2016  . Thrombocytopenia ([place]) [D69.6] 12/22/2016  . Hip fracture ([place]) [S72.009A] 12/19/2016  . Alzheimer's dementia [G30.9, F02.80] 12/04/2016    Total Time spent with patient: 1 hour  Subjective:   Yesenia Leonard is a 83 y.o. female patient admitted with "this is a place where they grow fruit".  HPI:  Patient interviewed chart reviewed. Patient is currently calm and engaged in some conversation but her dementia is severe and she is not able to give any lucid information. According to the chart she was brought here last night from [place] and was very agitated. She was described as kicking and biting and spitting when she came in and had to be immediately sedated. I still don't have any details about what had gone on but we know that she had just been at [facility] placement for about 2 days before coming back. Patient is not able to give any information at all. No sign of her labs of acute new medical problem. Drug screen does have benzodiazepines in it which would be explained by the use of when necessary Ativan.  Social history: Apparently there is family who are interested but the patient has a legal guardian in the Department of Social Services. I have tried to reach out to them and have only been able to leave a voicemail so far hope to get in touch with them later  today. She was only placed at [facility] on the 29th. She had been here in our emergency room for several days prior to that after being brought here from [facility]. If I'm understanding the chart right she had only recently been admitted to [facility].  Medical history: Reportedly has a history of thrombocytopenia and hypotension. Her blood sugar is slightly elevated but there is no note of a past diagnosis of diabetes. Patient is rather frail but does not appear to be in any acute medical distress or acutely injured.  Substance abuse history: None    Past Psychiatric History: Patient has a history of dementia and it's been documented for years. I have seen this patient before and other consult settings. No history of other mental health problems outside of dementia with agitation. It looks to me from the chart like they were managing the dementia with Ativan recently. It also looks like she might of been getting some hydrocodone although her drug screen was negative for opiates. No known history of suicidality or violence outside of the agitation of dementia.  Risk to Self: Is patient at risk for suicide?: No Risk to Others:   Prior Inpatient Therapy:   Prior Outpatient Therapy:    Past Medical History:  Past Medical History:  Diagnosis Date  . Dementia   . Hypercalcemia   . Hypertension   . Hypokalemia   . Osteoporosis   . PPD positive   . Pre-diabetes     Past Surgical History:  Procedure Laterality Date  . APPENDECTOMY    .  INTRAMEDULLARY (IM) NAIL INTERTROCHANTERIC Right 12/19/2016   Procedure: INTRAMEDULLARY (IM) NAIL INTERTROCHANTRIC;  Surgeon: Corky Mull, MD;  Location: [facility] ORS;  Service: Orthopedics;  Laterality: Right;   Family History:  Family History  Problem Relation Age of Onset  . Hypertension Mother    Family Psychiatric  History: Unknown but probably noncontributory Social History:  History  Alcohol Use No     History  Drug Use No    Social  History   Social History  . Marital status: Widowed    Spouse name: N/A  . Number of children: N/A  . Years of education: N/A   Social History Main Topics  . Smoking status: Never Smoker  . Smokeless tobacco: Never Used  . Alcohol use No  . Drug use: No  . Sexual activity: Not on file   Other Topics Concern  . Not on file   Social History Narrative  . No narrative on file   Additional Social History:    Allergies:  No Known Allergies  Labs:  Results for orders placed or performed during the hospital encounter of 06/18/17 (from the past 48 hour(s))  CBC with Differential     Status: None   Collection Time: 06/18/17  9:35 PM  Result Value Ref Range   WBC 7.7 3.6 - 11.0 K/uL   RBC 4.94 3.80 - 5.20 MIL/uL   Hemoglobin 14.6 12.0 - 16.0 g/dL   HCT 43.6 35.0 - 47.0 %   MCV 88.3 80.0 - 100.0 fL   MCH 29.5 26.0 - 34.0 pg   MCHC 33.4 32.0 - 36.0 g/dL   RDW 13.5 11.5 - 14.5 %   Platelets 159 150 - 440 K/uL   Neutrophils Relative % 79 %   Neutro Abs 6.1 1.4 - 6.5 K/uL   Lymphocytes Relative 13 %   Lymphs Abs 1.0 1.0 - 3.6 K/uL   Monocytes Relative 6 %   Monocytes Absolute 0.4 0.2 - 0.9 K/uL   Eosinophils Relative 1 %   Eosinophils Absolute 0.1 0 - 0.7 K/uL   Basophils Relative 1 %   Basophils Absolute 0.0 0 - 0.1 K/uL  Comprehensive metabolic panel     Status: Abnormal   Collection Time: 06/18/17  9:35 PM  Result Value Ref Range   Sodium 143 135 - 145 mmol/L   Potassium 3.3 (L) 3.5 - 5.1 mmol/L   Chloride 109 101 - 111 mmol/L   CO2 28 22 - 32 mmol/L   Glucose, Bld 208 (H) 65 - 99 mg/dL   BUN 15 6 - 20 mg/dL   Creatinine, Ser 0.79 0.44 - 1.00 mg/dL   Calcium 11.1 (H) 8.9 - 10.3 mg/dL   Total Protein 7.0 6.5 - 8.1 g/dL   Albumin 4.0 3.5 - 5.0 g/dL   AST 30 15 - 41 U/L   ALT 12 (L) 14 - 54 U/L   Alkaline Phosphatase 79 38 - 126 U/L   Total Bilirubin 1.0 0.3 - 1.2 mg/dL   GFR calc non Af Amer >60 >60 mL/min   GFR calc Af Amer >60 >60 mL/min    Comment: (NOTE) The  eGFR has been calculated using the CKD EPI equation. This calculation has not been validated in all clinical situations. eGFR's persistently <60 mL/min signify possible Chronic Kidney Disease.    Anion gap 6 5 - 15  Troponin I     Status: None   Collection Time: 06/18/17  9:35 PM  Result Value Ref Range   Troponin I <  0.03 <0.03 ng/mL  Urinalysis, Complete w Microscopic     Status: Abnormal   Collection Time: 06/18/17  9:35 PM  Result Value Ref Range   Color, Urine YELLOW (A) YELLOW   APPearance CLEAR (A) CLEAR   Specific Gravity, Urine 1.018 1.005 - 1.030   pH 5.0 5.0 - 8.0   Glucose, UA 50 (A) NEGATIVE mg/dL   Hgb urine dipstick MODERATE (A) NEGATIVE   Bilirubin Urine NEGATIVE NEGATIVE   Ketones, ur NEGATIVE NEGATIVE mg/dL   Protein, ur 100 (A) NEGATIVE mg/dL   Nitrite NEGATIVE NEGATIVE   Leukocytes, UA NEGATIVE NEGATIVE   RBC / HPF 6-30 0 - 5 RBC/hpf   WBC, UA 0-5 0 - 5 WBC/hpf   Bacteria, UA NONE SEEN NONE SEEN   Squamous Epithelial / LPF 0-5 (A) NONE SEEN   Mucous PRESENT     Current Facility-Administered Medications  Medication Dose Route Frequency Provider Last Rate Last Dose  . aspirin chewable tablet 81 mg  81 mg Oral Daily Kaylynne Andres T, MD      . carvedilol (COREG) tablet 3.125 mg  3.125 mg Oral BID WC Damisha Wolff T, MD      . cholecalciferol (VITAMIN D) tablet 1,000 Units  1,000 Units Oral Daily Carina Chaplin T, MD      . docusate sodium (COLACE) capsule 100 mg  100 mg Oral BID Tollie Canada T, MD      . latanoprost (XALATAN) 0.005 % ophthalmic solution 1 drop  1 drop Both Eyes QHS Drago Hammonds T, MD      . mirtazapine (REMERON SOL-TAB) disintegrating tablet 15 mg  15 mg Oral QHS Mehar Kirkwood T, MD      . OLANZapine zydis (ZYPREXA) disintegrating tablet 2.5 mg  2.5 mg Oral Daily Rowen Hur T, MD      . OLANZapine zydis (ZYPREXA) disintegrating tablet 5 mg  5 mg Oral QHS Cecylia Brazill, Madie Reno, MD       Current Outpatient Prescriptions  Medication Sig Dispense  Refill  . acetaminophen (TYLENOL) 500 MG tablet Take 500 mg by mouth every 6 (six) hours as needed.    Marland Kitchen alendronate (FOSAMAX) 70 MG tablet Take 70 mg by mouth once a week. Take with a full glass of water on an empty stomach.    Marland Kitchen aspirin 81 MG chewable tablet Chew 81 mg by mouth daily.    . carvedilol (COREG) 3.125 MG tablet Take 3.125 mg by mouth 2 (two) times daily with a meal.    . cholecalciferol (VITAMIN D) 1000 units tablet Take 1,000 Units by mouth daily.    Marland Kitchen donepezil (ARICEPT) 5 MG tablet Take 5 mg by mouth at bedtime.    Marland Kitchen latanoprost (XALATAN) 0.005 % ophthalmic solution Place 1 drop into both eyes at bedtime.  5  . LORazepam (ATIVAN) 1 MG tablet Take 1 tablet (1 mg total) by mouth every 8 (eight) hours as needed (agitation). (Patient taking differently: Take 1 mg by mouth 2 (two) times daily as needed (agitation). ) 12 tablet 0  . mirtazapine (REMERON) 7.5 MG tablet Take 7.5 mg by mouth at bedtime.    . docusate sodium (COLACE) 100 MG capsule Take 1 capsule (100 mg total) by mouth 2 (two) times daily. (Patient not taking: Reported on 06/13/2017) 10 capsule 0  . enoxaparin (LOVENOX) 40 MG/0.4ML injection Inject 0.4 mLs (40 mg total) into the skin daily. (Patient not taking: Reported on 06/13/2017) 0 Syringe   . ferrous sulfate 325 (65 FE) MG  tablet Take 1 tablet (325 mg total) by mouth 3 (three) times daily with meals. (Patient not taking: Reported on 06/13/2017) 90 tablet 0  . HYDROcodone-acetaminophen (NORCO/VICODIN) 5-325 MG tablet Take 1 tablet by mouth every 6 (six) hours as needed for moderate pain. (Patient not taking: Reported on 06/13/2017) 12 tablet 0    Musculoskeletal: Strength & Muscle Tone: decreased Gait & Station: unsteady Patient leans: N/A  Psychiatric Specialty Exam: Physical Exam  Nursing note and vitals reviewed. Constitutional: She appears well-developed.  HENT:  Head: Normocephalic and atraumatic.  Eyes: Pupils are equal, round, and reactive to light.  Conjunctivae are normal.  Neck: Normal range of motion.  Cardiovascular: Regular rhythm and normal heart sounds.   Respiratory: Effort normal. No respiratory distress.  GI: Soft.  Musculoskeletal: Normal range of motion.  Neurological: She is alert.  Skin: Skin is warm and dry.  Psychiatric: Her affect is blunt. Her speech is delayed and tangential. She is slowed and withdrawn. Cognition and memory are impaired. She expresses inappropriate judgment. She expresses no homicidal and no suicidal ideation. She is noncommunicative. She exhibits abnormal recent memory and abnormal remote memory.    Review of Systems  Unable to perform ROS: Dementia    Blood pressure 115/82, pulse (!) 115, temperature 98.2 F (36.8 C), resp. rate 16, weight 124 lb (56.2 kg), SpO2 99 %.Body mass index is 20.63 kg/m.  General Appearance: Casual  Eye Contact:  Fair  Speech:  Garbled and Slurred  Volume:  Decreased  Mood:  Euthymic  Affect:  Constricted  Thought Process:  Disorganized and Irrelevant  Orientation:  Other:  Patient is completely disoriented. She repeated several times to me that she was currently in a place where they grow fruit.  Thought Content:  Very impaired by dementia. She could tell me her name but was not able to put together any other coherent information or answers to questions.  Suicidal Thoughts:  No  Homicidal Thoughts:  No  Memory:  Immediate;   Poor Recent;   Poor Remote;   Poor  Judgement:  Impaired  Insight:  Lacking  Psychomotor Activity:  Normal  Concentration:  Concentration: Poor  Recall:  Poor  Fund of Knowledge:  Poor  Language:  Poor  Akathisia:  No  Handed:  Right  AIMS (if indicated):     Assets:  Social Support  ADL's:  Impaired  Cognition:  Impaired,  Moderate and Severe  Sleep:        Treatment Plan Summary: Daily contact with patient to assess and evaluate symptoms and progress in treatment, Medication management and Plan This is a 83 year old woman  with a history of dementia. To examination today she appears to have pretty advanced and severe dementia. Not able to understand where she is even when I told her several times. Not able to engage in any kind of lucid conversation. Looking at the recent chart it sounds like there've been 2 attempts at placement for her and that on both occasions she has gotten agitated fairly soon after placement which has brought her back to the emergency room. Here in the emergency room she is not showing any other treatable symptoms of mental illness. Patient is not going to benefit from a geriatric psychiatry unit. Her problem is dementia and she needs appropriate placement for it. I do have one suspicion that the Ativan may not be the best choice for treating her behavior. I'm going to discontinue benzodiazepines and put her on a low dose of Zyprexa  on a standing basis and see if she can tolerate that without being oversedated. Social work is involved. I'm awaiting a call back from the guardian. Patient does not meet commitment criteria and really does not need psychiatric treatment so much as appropriate placement. Case reviewed with the ER doctor and TTS.  Disposition: No evidence of imminent risk to self or others at present.   Patient does not meet criteria for psychiatric inpatient admission.  Alethia Berthold, MD 06/19/2017 1:28 PM

## 2017-06-19 NOTE — ED Notes (Signed)
Patient refused breakfast 

## 2017-06-19 NOTE — ED Notes (Signed)
Received a call from "Tiffany" ?  From DSS requesting information  I informed her that pt is at baseline this am  No aggression  Requires reorientation  "She is certainly not at baseline - she had not been receiving her meds and then she goes back to her facility - starts taking the meds and then she went nuts.  Her medicine and missing it could have caused this right?"  I informed her that I cannot make medical diagnosis and that she is pending a psych consult  I will ask Psych MD to call guardian for colateral information and update    Ensured her of pt safety - nutrition - hygiene     Yesenia FlingGuardian  Tina Reece  [phone]

## 2017-06-20 MED ORDER — OLANZAPINE 5 MG PO TABS
5.0000 mg | ORAL_TABLET | Freq: Every day | ORAL | Status: DC
  Administered 2017-06-20 (×2): 5 mg via ORAL
  Filled 2017-06-20: qty 1

## 2017-06-20 MED ORDER — OLANZAPINE 5 MG PO TABS
ORAL_TABLET | ORAL | Status: AC
  Administered 2017-06-20: 5 mg via ORAL
  Filled 2017-06-20: qty 1

## 2017-06-20 NOTE — Progress Notes (Signed)
CSW has staffed case with both Psychiatrist and Patient's legal guardian. Psychiatrist is agreeable to observe medications over the weekend. Psychiatrist reports that he spoke with both Patient's legal guardian and Librarian, academicxecutive Director of [facility] on yesterday evening. CSW continues to follow.    Enos FlingAshley Ivi Griffith, MSW, LCSW [facility] Clinical Social Worker [phone]    Enos FlingAshley Zuleima Haser, MSW, LCSW [facility] Clinical Social Worker [phone]

## 2017-06-20 NOTE — Progress Notes (Signed)
Per MD and RN notes from last night, Patient w/ increased agitation and does need at least some sedation to improve symptomatology. [facility] is requesting that Patient be observed on medication to determine effectiveness and that Patient be stabilized prior to returning. Patient's social worker with [facility] (Guardian) is in agreement. CSW has reached out to CSW Leadership to staff case. CSW awaiting return phone call. CSW continues to follow.    Enos FlingAshley Amman Bartel, MSW, LCSW [facility] Clinical Social Worker [phone]

## 2017-06-20 NOTE — ED Notes (Signed)
Pt bed linens changed,pt given a bed bath including hair wash, peri care, face washed, body washed; pt into a new brief; pt up and ambulated with assistance from this tech and a walker to restroom; pt urinated as well as had a bowel movement;

## 2017-06-20 NOTE — ED Provider Notes (Signed)
-----------------------------------------   7:34 AM on 06/20/2017 -----------------------------------------   Blood pressure 125/77, pulse 88, temperature 97.6 F (36.4 C), temperature source Oral, resp. rate 18, weight 56.2 kg (124 lb), SpO2 96 %.  The patient had no acute events since last update.  Calm and cooperative at this time.  Disposition is pending per Psychiatry/Behavioral Medicine team recommendations.     Nita SickleVeronese, [place], MD 06/20/17 [phone]

## 2017-06-20 NOTE — Consult Note (Signed)
[facility] Consult   Reason for Consult:  Consult for 83 year old woman with a history of dementia Referring Physician:  Cinda Quest Patient Identification: Yesenia Leonard MRN:  [id number] Principal Diagnosis: Alzheimer's dementia Diagnosis:   Patient Active Problem List   Diagnosis Date Noted  . S/P ORIF (open reduction internal fixation) fracture [Z96.7, Z87.81] 12/22/2016  . Hypotension [I95.9] 12/22/2016  . Acute posthemorrhagic anemia [D62] 12/22/2016  . H/O transfusion of packed red blood cells [Z92.89] 12/22/2016  . Dizziness [R42] 12/22/2016  . Orthostatic hypotension [I95.1] 12/22/2016  . Thrombocytopenia ([place]) [D69.6] 12/22/2016  . Hip fracture ([place]) [S72.009A] 12/19/2016  . Alzheimer's dementia [G30.9, F02.80] 12/04/2016    Total Time spent with patient: 20 minutes  Subjective:   Yesenia Leonard is a 83 y.o. female patient admitted with "this is a place where they grow fruit".  Follow-up for Friday the third. Patient still in the emergency room. No acceptance so far at geriatric psychiatry units. Patient has been calm for the most part not aggressive not agitated not violent and tolerating medicine. Both the patient's guardian and representatives from her living situation have been hesitant to allow her to go back home given her prior behavior  HPI:  Patient interviewed chart reviewed. Patient is currently calm and engaged in some conversation but her dementia is severe and she is not able to give any lucid information. According to the chart she was brought here last night from [facility] and was very agitated. She was described as kicking and biting and spitting when she came in and had to be immediately sedated. I still don't have any details about what had gone on but we know that she had just been at [facility] placement for about 2 days before coming back. Patient is not able to give any information at all. No sign of her labs of acute new medical  problem. Drug screen does have benzodiazepines in it which would be explained by the use of when necessary Ativan.  Social history: Apparently there is family who are interested but the patient has a legal guardian in the Department of Social Services. I have tried to reach out to them and have only been able to leave a voicemail so far hope to get in touch with them later today. She was only placed at [facility] on the 29th. She had been here in our emergency room for several days prior to that after being brought here from [facility]. If I'm understanding the chart right she had only recently been admitted to [facility].  Medical history: Reportedly has a history of thrombocytopenia and hypotension. Her blood sugar is slightly elevated but there is no note of a past diagnosis of diabetes. Patient is rather frail but does not appear to be in any acute medical distress or acutely injured.  Substance abuse history: None    Past Psychiatric History: Patient has a history of dementia and it's been documented for years. I have seen this patient before and other consult settings. No history of other mental health problems outside of dementia with agitation. It looks to me from the chart like they were managing the dementia with Ativan recently. It also looks like she might of been getting some hydrocodone although her drug screen was negative for opiates. No known history of suicidality or violence outside of the agitation of dementia.  Risk to Self: Suicidal Ideation: No Suicidal Intent: No Is patient at risk for suicide?: No Suicidal Plan?: No Access  to Means:  (UTA due to pt mental status/presentation) What has been your use of drugs/alcohol within the last 12 months?: UTA due to pt mental status/presentation How many times?:  (UTA due to pt mental status/presentation) Intentional Self Injurious Behavior:  (UTA due to pt mental status/presentation) Risk to Others: Homicidal Ideation:  No Thoughts of Harm to Others:  (UTA due to pt mental status/presentation) Current Homicidal Intent: No Current Homicidal Plan: No Access to Homicidal Means:  (UTA due to pt mental status/presentation) Does patient have access to weapons?:  (UTA due to pt mental status/presentation) Criminal Charges Pending?:  (UTA due to pt mental status/presentation) Does patient have a court date:  (UTA due to pt mental status/presentation) Prior Inpatient Therapy: Prior Inpatient Therapy:  (UTA due to pt mental status/presentation) Prior Outpatient Therapy: Prior Outpatient Therapy:  (UTA due to pt mental status/presentation) Does patient have an ACCT team?: No Does patient have Intensive In-House Services?  : No Does patient have [facility] services? : No Does patient have P4CC services?: No  Past Medical History:  Past Medical History:  Diagnosis Date  . Dementia   . Hypercalcemia   . Hypertension   . Hypokalemia   . Osteoporosis   . PPD positive   . Pre-diabetes     Past Surgical History:  Procedure Laterality Date  . APPENDECTOMY    . INTRAMEDULLARY (IM) NAIL INTERTROCHANTERIC Right 12/19/2016   Procedure: INTRAMEDULLARY (IM) NAIL INTERTROCHANTRIC;  Surgeon: Corky Mull, MD;  Location: [facility] ORS;  Service: Orthopedics;  Laterality: Right;   Family History:  Family History  Problem Relation Age of Onset  . Hypertension Mother    Family Psychiatric  History: Unknown but probably noncontributory Social History:  History  Alcohol Use No     History  Drug Use No    Social History   Social History  . Marital status: Widowed    Spouse name: N/A  . Number of children: N/A  . Years of education: N/A   Social History Main Topics  . Smoking status: Never Smoker  . Smokeless tobacco: Never Used  . Alcohol use No  . Drug use: No  . Sexual activity: Not on file   Other Topics Concern  . Not on file   Social History Narrative  . No narrative on file   Additional Social History:     Allergies:  No Known Allergies  Labs:  Results for orders placed or performed during the hospital encounter of 06/18/17 (from the past 48 hour(s))  CBC with Differential     Status: None   Collection Time: 06/18/17  9:35 PM  Result Value Ref Range   WBC 7.7 3.6 - 11.0 K/uL   RBC 4.94 3.80 - 5.20 MIL/uL   Hemoglobin 14.6 12.0 - 16.0 g/dL   HCT 43.6 35.0 - 47.0 %   MCV 88.3 80.0 - 100.0 fL   MCH 29.5 26.0 - 34.0 pg   MCHC 33.4 32.0 - 36.0 g/dL   RDW 13.5 11.5 - 14.5 %   Platelets 159 150 - 440 K/uL   Neutrophils Relative % 79 %   Neutro Abs 6.1 1.4 - 6.5 K/uL   Lymphocytes Relative 13 %   Lymphs Abs 1.0 1.0 - 3.6 K/uL   Monocytes Relative 6 %   Monocytes Absolute 0.4 0.2 - 0.9 K/uL   Eosinophils Relative 1 %   Eosinophils Absolute 0.1 0 - 0.7 K/uL   Basophils Relative 1 %   Basophils Absolute 0.0 0 - 0.1  K/uL  Comprehensive metabolic panel     Status: Abnormal   Collection Time: 06/18/17  9:35 PM  Result Value Ref Range   Sodium 143 135 - 145 mmol/L   Potassium 3.3 (L) 3.5 - 5.1 mmol/L   Chloride 109 101 - 111 mmol/L   CO2 28 22 - 32 mmol/L   Glucose, Bld 208 (H) 65 - 99 mg/dL   BUN 15 6 - 20 mg/dL   Creatinine, Ser 0.79 0.44 - 1.00 mg/dL   Calcium 11.1 (H) 8.9 - 10.3 mg/dL   Total Protein 7.0 6.5 - 8.1 g/dL   Albumin 4.0 3.5 - 5.0 g/dL   AST 30 15 - 41 U/L   ALT 12 (L) 14 - 54 U/L   Alkaline Phosphatase 79 38 - 126 U/L   Total Bilirubin 1.0 0.3 - 1.2 mg/dL   GFR calc non Af Amer >60 >60 mL/min   GFR calc Af Amer >60 >60 mL/min    Comment: (NOTE) The eGFR has been calculated using the CKD EPI equation. This calculation has not been validated in all clinical situations. eGFR's persistently <60 mL/min signify possible Chronic Kidney Disease.    Anion gap 6 5 - 15  Troponin I     Status: None   Collection Time: 06/18/17  9:35 PM  Result Value Ref Range   Troponin I <0.03 <0.03 ng/mL  Urinalysis, Complete w Microscopic     Status: Abnormal   Collection Time:  06/18/17  9:35 PM  Result Value Ref Range   Color, Urine YELLOW (A) YELLOW   APPearance CLEAR (A) CLEAR   Specific Gravity, Urine 1.018 1.005 - 1.030   pH 5.0 5.0 - 8.0   Glucose, UA 50 (A) NEGATIVE mg/dL   Hgb urine dipstick MODERATE (A) NEGATIVE   Bilirubin Urine NEGATIVE NEGATIVE   Ketones, ur NEGATIVE NEGATIVE mg/dL   Protein, ur 100 (A) NEGATIVE mg/dL   Nitrite NEGATIVE NEGATIVE   Leukocytes, UA NEGATIVE NEGATIVE   RBC / HPF 6-30 0 - 5 RBC/hpf   WBC, UA 0-5 0 - 5 WBC/hpf   Bacteria, UA NONE SEEN NONE SEEN   Squamous Epithelial / LPF 0-5 (A) NONE SEEN   Mucous PRESENT     Current Facility-Administered Medications  Medication Dose Route Frequency Provider Last Rate Last Dose  . aspirin chewable tablet 81 mg  81 mg Oral Daily Durwin Davisson, Madie Reno, MD   81 mg at 06/20/17 0929  . carvedilol (COREG) tablet 3.125 mg  3.125 mg Oral BID WC Sulma Ruffino T, MD   3.125 mg at 06/20/17 1752  . cholecalciferol (VITAMIN D) tablet 1,000 Units  1,000 Units Oral Daily Hanni Milford, Madie Reno, MD   1,000 Units at 06/20/17 [phone]  . docusate sodium (COLACE) capsule 100 mg  100 mg Oral BID Chance Karam, Madie Reno, MD   100 mg at 06/20/17 0925  . latanoprost (XALATAN) 0.005 % ophthalmic solution 1 drop  1 drop Both Eyes QHS Kysen Wetherington, Madie Reno, MD   1 drop at 06/19/17 2246  . mirtazapine (REMERON SOL-TAB) disintegrating tablet 15 mg  15 mg Oral QHS Kania Regnier, Madie Reno, MD   15 mg at 06/19/17 2242  . OLANZapine (ZYPREXA) tablet 5 mg  5 mg Oral QHS Alfred Levins, [place], MD   5 mg at 06/20/17 1524  . OLANZapine zydis (ZYPREXA) disintegrating tablet 10 mg  10 mg Oral QHS Jaykwon Morones, Madie Reno, MD   10 mg at 06/19/17 2242  . OLANZapine zydis (ZYPREXA) disintegrating tablet 2.5 mg  2.5 mg Oral Daily Eudelia Hiltunen, Madie Reno, MD   2.5 mg at 06/20/17 1610   Current Outpatient Prescriptions  Medication Sig Dispense Refill  . acetaminophen (TYLENOL) 500 MG tablet Take 500 mg by mouth every 6 (six) hours as needed.    Marland Kitchen alendronate (FOSAMAX) 70 MG tablet  Take 70 mg by mouth once a week. Take with a full glass of water on an empty stomach.    Marland Kitchen aspirin 81 MG chewable tablet Chew 81 mg by mouth daily.    . carvedilol (COREG) 3.125 MG tablet Take 3.125 mg by mouth 2 (two) times daily with a meal.    . cholecalciferol (VITAMIN D) 1000 units tablet Take 1,000 Units by mouth daily.    Marland Kitchen donepezil (ARICEPT) 5 MG tablet Take 5 mg by mouth at bedtime.    Marland Kitchen latanoprost (XALATAN) 0.005 % ophthalmic solution Place 1 drop into both eyes at bedtime.  5  . LORazepam (ATIVAN) 1 MG tablet Take 1 tablet (1 mg total) by mouth every 8 (eight) hours as needed (agitation). (Patient taking differently: Take 1 mg by mouth 2 (two) times daily as needed (agitation). ) 12 tablet 0  . mirtazapine (REMERON) 7.5 MG tablet Take 7.5 mg by mouth at bedtime.    . docusate sodium (COLACE) 100 MG capsule Take 1 capsule (100 mg total) by mouth 2 (two) times daily. (Patient not taking: Reported on 06/13/2017) 10 capsule 0  . enoxaparin (LOVENOX) 40 MG/0.4ML injection Inject 0.4 mLs (40 mg total) into the skin daily. (Patient not taking: Reported on 06/13/2017) 0 Syringe   . ferrous sulfate 325 (65 FE) MG tablet Take 1 tablet (325 mg total) by mouth 3 (three) times daily with meals. (Patient not taking: Reported on 06/13/2017) 90 tablet 0  . HYDROcodone-acetaminophen (NORCO/VICODIN) 5-325 MG tablet Take 1 tablet by mouth every 6 (six) hours as needed for moderate pain. (Patient not taking: Reported on 06/13/2017) 12 tablet 0    Musculoskeletal: Strength & Muscle Tone: decreased Gait & Station: unsteady Patient leans: N/A  Psychiatric Specialty Exam: Physical Exam  Nursing note and vitals reviewed. Constitutional: She appears well-developed.  HENT:  Head: Normocephalic and atraumatic.  Eyes: Pupils are equal, round, and reactive to light. Conjunctivae are normal.  Neck: Normal range of motion.  Cardiovascular: Regular rhythm and normal heart sounds.   Respiratory: Effort normal. No  respiratory distress.  GI: Soft.  Musculoskeletal: Normal range of motion.  Neurological: She is alert.  Skin: Skin is warm and dry.  Psychiatric: Her affect is blunt. Her speech is delayed and tangential. She is slowed and withdrawn. Cognition and memory are impaired. She expresses inappropriate judgment. She expresses no homicidal and no suicidal ideation. She is noncommunicative. She exhibits abnormal recent memory and abnormal remote memory.    Review of Systems  Unable to perform ROS: Dementia    Blood pressure (!) 178/95, pulse 83, temperature 98.7 F (37.1 C), temperature source Oral, resp. rate 17, weight 56.2 kg (124 lb), SpO2 97 %.Body mass index is 20.63 kg/m.  General Appearance: Casual  Eye Contact:  Fair  Speech:  Garbled and Slurred  Volume:  Decreased  Mood:  Euthymic  Affect:  Constricted  Thought Process:  Disorganized and Irrelevant  Orientation:  Other:  Patient is completely disoriented. She repeated several times to me that she was currently in a place where they grow fruit.  Thought Content:  Very impaired by dementia. She could tell me her name but was not able to  put together any other coherent information or answers to questions.  Suicidal Thoughts:  No  Homicidal Thoughts:  No  Memory:  Immediate;   Poor Recent;   Poor Remote;   Poor  Judgement:  Impaired  Insight:  Lacking  Psychomotor Activity:  Normal  Concentration:  Concentration: Poor  Recall:  Poor  Fund of Knowledge:  Poor  Language:  Poor  Akathisia:  No  Handed:  Right  AIMS (if indicated):     Assets:  Social Support  ADL's:  Impaired  Cognition:  Impaired,  Moderate and Severe  Sleep:        Treatment Plan Summary: Daily contact with patient to assess and evaluate symptoms and progress in treatment, Medication management and Plan Patient with dementia with behavioral disturbance. Now on modest dose of Zyprexa. No acute behavior problems. Early and guardian have been hesitant  to have her come back home believing that she needs to be "stabilized" on medicine. We have attempted to refer to geriatric psychiatry units without success so far. Patient is not appropriate for admission to our psychiatric unit. Continue monitoring in the emergency room while attempting to refer to geriatric psychiatry although I think she could be discharged back to her living situation if they would take her.  Disposition: No evidence of imminent risk to self or others at present.   Patient does not meet criteria for psychiatric inpatient admission.  Alethia Berthold, MD 06/20/2017 6:08 PM

## 2017-06-20 NOTE — ED Notes (Signed)
Pt continues to scream for the nurse and this RN reported to assist, and discovered patient had ripped off her adult brief as well as her pants. This RN redressed patient and repositioned in bed.

## 2017-06-20 NOTE — ED Notes (Signed)
Pt resting peacefully and has been covered with two warm blankets

## 2017-06-20 NOTE — ED Notes (Signed)
Called pharmacy, spoke with Pharmacist Onalee Huaavid.  Pharmacist confirmed that zyprexa and mirtazapine could be dissolved in water, mixed in applesauce and given to patient. Patient took medications in applesauce with no issue.  Pt informed that colace capsule was in a spoon of applesauce, not dissolved - patient swallowed pill with no issue.

## 2017-06-20 NOTE — ED Notes (Signed)
Pt given breakfast tray and pt ate 95% of traY

## 2017-06-20 NOTE — ED Notes (Addendum)
Pt attempting to get out of bed, and yelling for nurse to take her to the bathroom. Pt offered to go with assistance, and then refused. This RN repositioned patient back in bed with bed alarm in place.

## 2017-06-21 MED ORDER — OLANZAPINE 5 MG PO TBDP
5.0000 mg | ORAL_TABLET | Freq: Every day | ORAL | Status: DC
  Administered 2017-06-21: 5 mg via ORAL
  Filled 2017-06-21: qty 1

## 2017-06-21 MED ORDER — LORAZEPAM 2 MG/ML IJ SOLN
INTRAMUSCULAR | Status: AC
Start: 2017-06-21 — End: 2017-06-21
  Administered 2017-06-21: 2 mg via INTRAMUSCULAR
  Filled 2017-06-21: qty 1

## 2017-06-21 MED ORDER — LORAZEPAM 2 MG/ML IJ SOLN
2.0000 mg | Freq: Once | INTRAMUSCULAR | Status: AC
  Administered 2017-06-21: 2 mg via INTRAMUSCULAR

## 2017-06-21 NOTE — ED Notes (Signed)
Voluntary/ pending placement 

## 2017-06-21 NOTE — ED Notes (Signed)
BEHAVIORAL HEALTH ROUNDING Patient sleeping: Yes.   Patient alert and oriented: not applicable SLEEPING Behavior appropriate: Yes.  ; If no, describe: SLEEPING Nutrition and fluids offered: No SLEEPING Toileting and hygiene offered: NoSLEEPING Sitter present: safety sitter at bedside plus Q 15 min safety rounds and observation. Law enforcement present: Yes ODS 

## 2017-06-21 NOTE — ED Notes (Signed)
Patient cont too sleepy to give meds or offer po. Sleep lightening slightly. Will offer po fluids when sufficiently awake.

## 2017-06-21 NOTE — ED Notes (Signed)
Patient now awake. Smiling. Able to take food and fluids, feeds self. Denies discomfort. Juices given to hydrate.

## 2017-06-21 NOTE — ED Notes (Signed)
BEHAVIORAL HEALTH ROUNDING Patient sleeping: Yes.   Patient alert and oriented: not applicable Behavior appropriate: Yes.  ; If no, describe:  Nutrition and fluids offered: No Toileting and hygiene offered: Yes  Sitter present: yes Law enforcement present: Yes  

## 2017-06-21 NOTE — ED Notes (Signed)
Patient somnulent, sitter at bedside, patient too sleepy to eat or take meds at this time. Resp unlabored.

## 2017-06-21 NOTE — ED Provider Notes (Signed)
-----------------------------------------   7:02 AM on 06/21/2017 -----------------------------------------   Blood pressure (!) 166/113, pulse 88, temperature 98.1 F (36.7 C), temperature source Axillary, resp. rate 15, weight 56.2 kg (124 lb), SpO2 95 %.  The patient had no acute events since last update.  Calm and cooperative at this time.  Disposition is pending Social work and placement.     Rebecka ApleyWebster, Allison P, MD 06/21/17 [phone]

## 2017-06-21 NOTE — ED Notes (Signed)
BEHAVIORAL HEALTH ROUNDING Patient sleeping: Yes.   Patient alert and oriented: not applicable Behavior appropriate: Yes.  ; If no, describe:  Nutrition and fluids offered: No Toileting and hygiene offered: No Sitter present: yes Law enforcement present: Yes   

## 2017-06-21 NOTE — ED Notes (Signed)
ENVIRONMENTAL ASSESSMENT  Potentially harmful objects out of patient reach: Yes.  Personal belongings secured: Yes.  Patient dressed in hospital provided attire only: Yes.  Plastic bags out of patient reach: Yes.  Patient care equipment (cords, cables, call bells, lines, and drains) shortened, removed, or accounted for: Yes.  Equipment and supplies removed from bottom of stretcher: Yes.  Potentially toxic materials out of patient reach: Yes.  Sharps container removed or out of patient reach: Yes.   BEHAVIORAL HEALTH ROUNDING  Patient sleeping: No.  Patient alert and oriented: yes to her baseline Behavior appropriate: Yes. ; If no, describe:  Nutrition and fluids offered: Yes  Toileting and hygiene offered: Yes  Sitter present: safety sitter at bedside and  Q 15 min safety rounds and observation.  Law enforcement present: Yes ODS  ED BHU PLACEMENT JUSTIFICATION  Is the patient under IVC or is there intent for IVC: no.  Is the patient medically cleared: Yes.  Is there vacancy in the ED BHU: Yes.  Is the population mix appropriate for patient: no.  Is the patient awaiting placement in inpatient or outpatient setting: Yes.  Has the patient had a psychiatric consult: Yes.  Survey of unit performed for contraband, proper placement and condition of furniture, tampering with fixtures in bathroom, shower, and each patient room: Yes. ; Findings: All clear  APPEARANCE/BEHAVIOR  calm, cooperative   NEURO ASSESSMENT  Orientation: self per her baseline Hallucinations: No.None noted (Hallucinations)  Speech: Normal  Gait: unsteady RESPIRATORY ASSESSMENT  WNL  CARDIOVASCULAR ASSESSMENT  WNL  GASTROINTESTINAL ASSESSMENT  WNL  EXTREMITIES  WNL  PLAN OF CARE  Provide calm/safe environment. Vital signs assessed TID. ED BHU Assessment once each 12-hour shift. Collaborate with TTS daily or as condition indicates. Assure the ED provider has rounded once each shift. Provide and encourage hygiene.  Provide redirection as needed. Assess for escalating behavior; address immediately and inform ED provider.  Assess family dynamic and appropriateness for visitation as needed: Yes. ; If necessary, describe findings:  Educate the patient/family about BHU procedures/visitation: Yes. ; If necessary, describe findings: Pt is calm and cooperative at this time. Will continue to monitor with Q 15 min safety rounds and observation and safety sitter at bedside.

## 2017-06-21 NOTE — ED Notes (Signed)
BEHAVIORAL HEALTH ROUNDING Patient sleeping: No. Patient alert and oriented: no Behavior appropriate: Yes.  ; If no, describe:  Nutrition and fluids offered: Yes  Toileting and hygiene offered: Yes  Sitter present: yes Law enforcement present: Yes  

## 2017-06-21 NOTE — ED Notes (Signed)
Cont to sleep. Does move around slightly but does not awaken.

## 2017-06-21 NOTE — ED Notes (Signed)
Patient cont asleep. Per NA patient has been inc of urine.

## 2017-06-21 NOTE — ED Notes (Signed)
BEHAVIORAL HEALTH ROUNDING Patient sleeping: Yes.   Patient alert and oriented: no Behavior appropriate: Yes.  ; If no, describe:  Nutrition and fluids offered: No Toileting and hygiene offered: No Sitter present: yes Law enforcement present: Yes  

## 2017-06-21 NOTE — Clinical Social Work Note (Signed)
CSW is continuing to work on disposition for pt. Pt will return to [facility] when stable. Per RN, pt received Ativan overnight and by time of note pt very sleepy and would not wake up enough to take medications or eat/drink. CSW left a message with [facility]'s case manager requesting a return phone call to update. MD is aware. CSW will continue to follow.   Dede QuerySarah Rayel Santizo, MSW, LCSW Clinical Social Worker  [phone]

## 2017-06-21 NOTE — ED Notes (Signed)
Pt yelling at random, taken to toilet twice, unable to redirect, Dr Zenda AlpersWebster notified, orders received

## 2017-06-21 NOTE — ED Notes (Signed)

## 2017-06-22 MED ORDER — OLANZAPINE 2.5 MG PO TABS: 2.5000 mg | ORAL_TABLET | Freq: Every day | ORAL | 0 refills | Status: DC

## 2017-06-22 MED ORDER — OLANZAPINE 5 MG PO TABS: 5.0000 mg | ORAL_TABLET | Freq: Every day | ORAL | 0 refills | Status: DC

## 2017-06-22 NOTE — Progress Notes (Signed)
New FL-2 completed to include Memory Care at discharge. FL-2 faxed to [facility].    Enos FlingAshley Bradie Sangiovanni, MSW, LCSW [facility] Clinical Social Worker [phone]

## 2017-06-22 NOTE — ED Notes (Addendum)
BEHAVIORAL HEALTH ROUNDING Patient sleeping: Yes.   Patient alert and oriented: not applicable SLEEPING Behavior appropriate: Yes.  ; If no, describe: SLEEPING Nutrition and fluids offered: No SLEEPING Toileting and hygiene offered: NoSLEEPING Sitter present: safety sitter at bedside and Q 15 min safety rounds and observation. Law enforcement present: Yes ODS 

## 2017-06-22 NOTE — Progress Notes (Signed)
CSW has updated and faxed another FL-2 to state ALF placement as per request from [place] [facility]. Per Patient's RN, [facility] can take Patient back on today by EMS.    Enos FlingAshley Jadin Kagel, MSW, LCSW [facility] Clinical Social Worker [phone]

## 2017-06-22 NOTE — Progress Notes (Signed)
CSW received return phone call from [place] at [facility] who reports that Patient can return via EMS. Tobi BastosAnna, RN notified.    Enos FlingAshley Shaconda Hajduk, MSW, LCSW [facility] Clinical Social Worker [phone]

## 2017-06-22 NOTE — ED Notes (Signed)
BEHAVIORAL HEALTH ROUNDING  Patient sleeping: No.  Patient alert and oriented: yes  Behavior appropriate: Yes. ; If no, describe:  Nutrition and fluids offered: Yes  Toileting and hygiene offered: Yes  Sitter present: safety sitter at bedside and Q 15 min safety rounds and observation.  Law enforcement present: Yes ODS  

## 2017-06-22 NOTE — ED Notes (Signed)
BEHAVIORAL HEALTH ROUNDING  Patient sleeping: No.  Patient alert and oriented: yes  Behavior appropriate: Yes. ; If no, describe:  Nutrition and fluids offered: Yes  Toileting and hygiene offered: Yes  Sitter present: safety sitter at bedside and Q 15 min safety rounds and observation.  Law enforcement present: Yes ODS

## 2017-06-22 NOTE — ED Notes (Signed)
Spoke with Laverne at [facility], she said she would call Levander Campionina Reece (pt's CSW) to notify her of return to [facility].

## 2017-06-22 NOTE — Progress Notes (Signed)
CSW is continuing to work on disposition for pt. [facility] when [facility].    Enos FlingAshley Christyna Letendre, MSW, LCSW [facility] Clinical Social Worker [phone]

## 2017-06-22 NOTE — ED Notes (Signed)
BEHAVIORAL HEALTH ROUNDING  Patient sleeping: No.  Patient alert and oriented: yes to her baseline Behavior appropriate: Yes. ; If no, describe:  Nutrition and fluids offered: Yes  Toileting and hygiene offered: Yes  Sitter present: safety sitter at bedside and Q 15 min safety rounds and observation.  Law enforcement present: Yes ODS

## 2017-06-22 NOTE — ED Provider Notes (Signed)
-----------------------------------------   6:20 AM on 06/22/2017 -----------------------------------------   Blood pressure 113/69, pulse 72, temperature (!) 97.5 F (36.4 C), temperature source Oral, resp. rate 16, weight 56.2 kg (124 lb), SpO2 95 %.  The patient had no acute events since last update.  Calm and cooperative at this time.  Disposition is pending social work consultation in the morning.   Merrily Brittleifenbark, Leonel Mccollum, MD 06/22/17 [phone]

## 2017-06-22 NOTE — ED Notes (Signed)
Message left for legal guardian to return phone call to ED.

## 2017-06-22 NOTE — NC FL2 (Signed)
[place] MEDICAID FL2 LEVEL OF CARE SCREENING TOOL     IDENTIFICATION  Patient Name: Yesenia Leonard Birthdate: February 09, 1924 Sex: female Admission Date (Current Location): 06/18/2017  [place]   Facility and Address:  [facility], [address], [place], [place] [address]      Provider Number: [id number]  Attending Physician Name and Address:  No att. providers found  Relative Name and Phone Number:  Elmarie Shileyiffany (DSS) [phone])     Current Level of Care: Hospital Recommended Level of Care: Assisted Living Facility,  Prior Approval Number:    Date Approved/Denied:   PASRR Number: [id number]  Discharge Plan: Other (Comment) (Assisted Living Facility    Current Diagnoses: Patient Active Problem List   Diagnosis Date Noted  . S/P ORIF (open reduction internal fixation) fracture 12/22/2016  . Hypotension 12/22/2016  . Acute posthemorrhagic anemia 12/22/2016  . H/O transfusion of packed red blood cells 12/22/2016  . Dizziness 12/22/2016  . Orthostatic hypotension 12/22/2016  . Thrombocytopenia (HCC) 12/22/2016  . Hip fracture (HCC) 12/19/2016  . Alzheimer's dementia 12/04/2016    Orientation RESPIRATION BLADDER Height & Weight     Self  Normal Continent Weight: 124 lb (56.2 kg) Height:     BEHAVIORAL SYMPTOMS/MOOD NEUROLOGICAL BOWEL NUTRITION STATUS      Continent Diet (Carb Modified)  AMBULATORY STATUS COMMUNICATION OF NEEDS Skin   Supervision Verbally Normal                       Personal Care Assistance Level of Assistance  Bathing, Dressing, Feeding Bathing Assistance: Limited assistance Feeding assistance: Independent Dressing Assistance: Limited assistance     Functional Limitations Info  Sight, Hearing, Speech Sight Info: Adequate Hearing Info: Adequate Speech Info: Adequate    SPECIAL CARE FACTORS FREQUENCY                       Contractures Contractures Info: Not present     Additional Factors Info  Code Status, Allergies, Psychotropic Code Status Info: FULL Allergies Info: No Known Allergies Psychotropic Info: Zyprexa; Remeron         Current Medications (06/22/2017):  This is the current hospital active medication list Current Facility-Administered Medications  Medication Dose Route Frequency Provider Last Rate Last Dose  . aspirin chewable tablet 81 mg  81 mg Oral Daily Clapacs, Jackquline DenmarkJohn T, MD   81 mg at 06/22/17 0931  . carvedilol (COREG) tablet 3.125 mg  3.125 mg Oral BID WC Clapacs, John T, MD   3.125 mg at 06/22/17 0932  . cholecalciferol (VITAMIN D) tablet 1,000 Units  1,000 Units Oral Daily Clapacs, Jackquline DenmarkJohn T, MD   1,000 Units at 06/22/17 0931  . docusate sodium (COLACE) capsule 100 mg  100 mg Oral BID Clapacs, Jackquline DenmarkJohn T, MD   100 mg at 06/22/17 0931  . latanoprost (XALATAN) 0.005 % ophthalmic solution 1 drop  1 drop Both Eyes QHS Clapacs, John T, MD   1 drop at 06/21/17 2324  . mirtazapine (REMERON SOL-TAB) disintegrating tablet 15 mg  15 mg Oral QHS Clapacs, Jackquline DenmarkJohn T, MD   15 mg at 06/21/17 2323  . OLANZapine (ZYPREXA) tablet 5 mg  5 mg Oral QHS Don PerkingVeronese, [place], MD   5 mg at 06/20/17 2151  . OLANZapine zydis (ZYPREXA) disintegrating tablet 2.5 mg  2.5 mg Oral Daily Clapacs, John T, MD   2.5 mg at 06/20/17 0925  . OLANZapine zydis (ZYPREXA) disintegrating tablet 5  mg  5 mg Oral QHS Sharyn CreamerQuale, Mark, MD   5 mg at 06/21/17 2323   Current Outpatient Prescriptions  Medication Sig Dispense Refill  . acetaminophen (TYLENOL) 500 MG tablet Take 500 mg by mouth every 6 (six) hours as needed.    Marland Kitchen. alendronate (FOSAMAX) 70 MG tablet Take 70 mg by mouth once a week. Take with a full glass of water on an empty stomach.    Marland Kitchen. aspirin 81 MG chewable tablet Chew 81 mg by mouth daily.    . carvedilol (COREG) 3.125 MG tablet Take 3.125 mg by mouth 2 (two) times daily with a meal.    . cholecalciferol (VITAMIN D) 1000 units tablet Take 1,000 Units by mouth daily.    Marland Kitchen. donepezil  (ARICEPT) 5 MG tablet Take 5 mg by mouth at bedtime.    Marland Kitchen. latanoprost (XALATAN) 0.005 % ophthalmic solution Place 1 drop into both eyes at bedtime.  5  . LORazepam (ATIVAN) 1 MG tablet Take 1 tablet (1 mg total) by mouth every 8 (eight) hours as needed (agitation). (Patient taking differently: Take 1 mg by mouth 2 (two) times daily as needed (agitation). ) 12 tablet 0  . mirtazapine (REMERON) 7.5 MG tablet Take 7.5 mg by mouth at bedtime.    . docusate sodium (COLACE) 100 MG capsule Take 1 capsule (100 mg total) by mouth 2 (two) times daily. (Patient not taking: Reported on 06/13/2017) 10 capsule 0  . enoxaparin (LOVENOX) 40 MG/0.4ML injection Inject 0.4 mLs (40 mg total) into the skin daily. (Patient not taking: Reported on 06/13/2017) 0 Syringe   . ferrous sulfate 325 (65 FE) MG tablet Take 1 tablet (325 mg total) by mouth 3 (three) times daily with meals. (Patient not taking: Reported on 06/13/2017) 90 tablet 0  . HYDROcodone-acetaminophen (NORCO/VICODIN) 5-325 MG tablet Take 1 tablet by mouth every 6 (six) hours as needed for moderate pain. (Patient not taking: Reported on 06/13/2017) 12 tablet 0     Discharge Medications: Please see discharge summary for a list of discharge medications.  Relevant Imaging Results:  Relevant Lab Results:   Additional Information [id number]  Lew DawesAshley N Sarann Tregre, LCSW

## 2017-06-22 NOTE — NC FL2 (Signed)
[place] MEDICAID FL2 LEVEL OF CARE SCREENING TOOL     IDENTIFICATION  Patient Name: Yesenia Leonard Birthdate: [birth year] Sex: female Admission Date (Current Location): 06/18/2017  [place]   Facility and Address:  [facility], [address], [place], [place] [address]      Provider Number: [id number]  Attending Physician Name and Address:  No att. providers found  Relative Name and Phone Number:  Elmarie Shileyiffany (DSS) [phone])     Current Level of Care: Hospital Recommended Level of Care: Memory Care Prior Approval Number:    Date Approved/Denied:   PASRR Number: [id number]  Discharge Plan: Other (Comment) (Memory Care Assisted Living Facility )    Current Diagnoses: Patient Active Problem List   Diagnosis Date Noted  . S/P ORIF (open reduction internal fixation) fracture 12/22/2016  . Hypotension 12/22/2016  . Acute posthemorrhagic anemia 12/22/2016  . H/O transfusion of packed red blood cells 12/22/2016  . Dizziness 12/22/2016  . Orthostatic hypotension 12/22/2016  . Thrombocytopenia (HCC) 12/22/2016  . Hip fracture (HCC) 12/19/2016  . Alzheimer's dementia 12/04/2016    Orientation RESPIRATION BLADDER Height & Weight     Self  Normal Continent Weight: 124 lb (56.2 kg) Height:     BEHAVIORAL SYMPTOMS/MOOD NEUROLOGICAL BOWEL NUTRITION STATUS      Continent Diet (Carb Modified)  AMBULATORY STATUS COMMUNICATION OF NEEDS Skin   Supervision Verbally Normal                       Personal Care Assistance Level of Assistance  Bathing, Dressing, Feeding Bathing Assistance: Limited assistance Feeding assistance: Independent Dressing Assistance: Limited assistance     Functional Limitations Info  Sight, Hearing, Speech Sight Info: Adequate Hearing Info: Adequate Speech Info: Adequate    SPECIAL CARE FACTORS FREQUENCY                       Contractures Contractures Info: Not present     Additional Factors Info  Code Status, Allergies, Psychotropic Code Status Info: FULL Allergies Info: No Known Allergies Psychotropic Info: Zyprexa; Remeron          Current Medications (06/22/2017):  This is the current hospital active medication list Current Facility-Administered Medications  Medication Dose Route Frequency Provider Last Rate Last Dose  . aspirin chewable tablet 81 mg  81 mg Oral Daily Clapacs, Jackquline DenmarkJohn T, MD   81 mg at 06/22/17 0931  . carvedilol (COREG) tablet 3.125 mg  3.125 mg Oral BID WC Clapacs, John T, MD   3.125 mg at 06/22/17 0932  . cholecalciferol (VITAMIN D) tablet 1,000 Units  1,000 Units Oral Daily Clapacs, Jackquline DenmarkJohn T, MD   1,000 Units at 06/22/17 0931  . docusate sodium (COLACE) capsule 100 mg  100 mg Oral BID Clapacs, Jackquline DenmarkJohn T, MD   100 mg at 06/22/17 0931  . latanoprost (XALATAN) 0.005 % ophthalmic solution 1 drop  1 drop Both Eyes QHS Clapacs, John T, MD   1 drop at 06/21/17 2324  . mirtazapine (REMERON SOL-TAB) disintegrating tablet 15 mg  15 mg Oral QHS Clapacs, Jackquline DenmarkJohn T, MD   15 mg at 06/21/17 2323  . OLANZapine (ZYPREXA) tablet 5 mg  5 mg Oral QHS Don PerkingVeronese, [place], MD   5 mg at 06/20/17 2151  . OLANZapine zydis (ZYPREXA) disintegrating tablet 2.5 mg  2.5 mg Oral Daily Clapacs, John T, MD   2.5 mg at 06/20/17 0925  . OLANZapine zydis (ZYPREXA) disintegrating  tablet 5 mg  5 mg Oral QHS Sharyn CreamerQuale, Mark, MD   5 mg at 06/21/17 2323   Current Outpatient Prescriptions  Medication Sig Dispense Refill  . acetaminophen (TYLENOL) 500 MG tablet Take 500 mg by mouth every 6 (six) hours as needed.    Marland Kitchen. alendronate (FOSAMAX) 70 MG tablet Take 70 mg by mouth once a week. Take with a full glass of water on an empty stomach.    Marland Kitchen. aspirin 81 MG chewable tablet Chew 81 mg by mouth daily.    . carvedilol (COREG) 3.125 MG tablet Take 3.125 mg by mouth 2 (two) times daily with a meal.    . cholecalciferol (VITAMIN D) 1000 units tablet Take 1,000 Units by mouth daily.    Marland Kitchen. donepezil  (ARICEPT) 5 MG tablet Take 5 mg by mouth at bedtime.    Marland Kitchen. latanoprost (XALATAN) 0.005 % ophthalmic solution Place 1 drop into both eyes at bedtime.  5  . LORazepam (ATIVAN) 1 MG tablet Take 1 tablet (1 mg total) by mouth every 8 (eight) hours as needed (agitation). (Patient taking differently: Take 1 mg by mouth 2 (two) times daily as needed (agitation). ) 12 tablet 0  . mirtazapine (REMERON) 7.5 MG tablet Take 7.5 mg by mouth at bedtime.    . docusate sodium (COLACE) 100 MG capsule Take 1 capsule (100 mg total) by mouth 2 (two) times daily. (Patient not taking: Reported on 06/13/2017) 10 capsule 0  . enoxaparin (LOVENOX) 40 MG/0.4ML injection Inject 0.4 mLs (40 mg total) into the skin daily. (Patient not taking: Reported on 06/13/2017) 0 Syringe   . ferrous sulfate 325 (65 FE) MG tablet Take 1 tablet (325 mg total) by mouth 3 (three) times daily with meals. (Patient not taking: Reported on 06/13/2017) 90 tablet 0  . HYDROcodone-acetaminophen (NORCO/VICODIN) 5-325 MG tablet Take 1 tablet by mouth every 6 (six) hours as needed for moderate pain. (Patient not taking: Reported on 06/13/2017) 12 tablet 0     Discharge Medications: Please see discharge summary for a list of discharge medications.  Relevant Imaging Results:  Relevant Lab Results:   Additional Information [id number]  Lew DawesAshley N Sriansh Farra, LCSW

## 2017-06-22 NOTE — ED Notes (Signed)
Spoke with Yesenia Leonard at [facility]; requesting updated FL2 with AL and patient can return to [facility] today by EMS.

## 2017-06-22 NOTE — ED Provider Notes (Signed)
-----------------------------------------   3:40 PM on 06/22/2017 -----------------------------------------   Blood pressure 98/62, pulse 76, temperature 98 F (36.7 C), temperature source Oral, resp. rate 16, weight 56.2 kg (124 lb), SpO2 95 %.  The patient had no acute events since last update.  Calm and cooperative at this time. Patient to be dispositioned to [facility].     Myrna BlazerSchaevitz, Nayson Traweek Matthew, MD 06/22/17 1540

## 2017-08-19 ENCOUNTER — Emergency Department: Payer: Medicare Other

## 2017-08-19 ENCOUNTER — Encounter: Payer: Self-pay | Admitting: Internal Medicine

## 2017-08-19 ENCOUNTER — Observation Stay
Admission: EM | Admit: 2017-08-19 | Discharge: 2017-08-22 | Disposition: A | Discharge status: Skilled Nursing Facility | Payer: Medicare Other | Attending: Internal Medicine | Admitting: Internal Medicine

## 2017-08-19 DIAGNOSIS — Z7982 Long term (current) use of aspirin: Secondary | ICD-10-CM | POA: Insufficient documentation

## 2017-08-19 DIAGNOSIS — N390 Urinary tract infection, site not specified: Principal | ICD-10-CM | POA: Insufficient documentation

## 2017-08-19 DIAGNOSIS — R778 Other specified abnormalities of plasma proteins: Secondary | ICD-10-CM

## 2017-08-19 DIAGNOSIS — B9620 Unspecified Escherichia coli [E. coli] as the cause of diseases classified elsewhere: Secondary | ICD-10-CM | POA: Diagnosis not present

## 2017-08-19 DIAGNOSIS — R7989 Other specified abnormal findings of blood chemistry: Secondary | ICD-10-CM

## 2017-08-19 DIAGNOSIS — Z79899 Other long term (current) drug therapy: Secondary | ICD-10-CM | POA: Diagnosis not present

## 2017-08-19 DIAGNOSIS — I10 Essential (primary) hypertension: Secondary | ICD-10-CM | POA: Diagnosis present

## 2017-08-19 DIAGNOSIS — R4182 Altered mental status, unspecified: Secondary | ICD-10-CM | POA: Diagnosis present

## 2017-08-19 DIAGNOSIS — B964 Proteus (mirabilis) (morganii) as the cause of diseases classified elsewhere: Secondary | ICD-10-CM | POA: Insufficient documentation

## 2017-08-19 DIAGNOSIS — G309 Alzheimer's disease, unspecified: Secondary | ICD-10-CM | POA: Insufficient documentation

## 2017-08-19 DIAGNOSIS — F0280 Dementia in other diseases classified elsewhere without behavioral disturbance: Secondary | ICD-10-CM | POA: Insufficient documentation

## 2017-08-19 DIAGNOSIS — R748 Abnormal levels of other serum enzymes: Secondary | ICD-10-CM | POA: Diagnosis present

## 2017-08-19 LAB — CBC WITH DIFFERENTIAL/PLATELET
Basophils Absolute: 0.1 10*3/uL (ref 0–0.1)
Basophils Relative: 1 %
EOS ABS: 0 10*3/uL (ref 0–0.7)
EOS PCT: 0 %
HCT: 42 % (ref 35.0–47.0)
Hemoglobin: 14.3 g/dL (ref 12.0–16.0)
LYMPHS ABS: 0.8 10*3/uL — AB (ref 1.0–3.6)
LYMPHS PCT: 7 %
MCH: 30.3 pg (ref 26.0–34.0)
MCHC: 34 g/dL (ref 32.0–36.0)
MCV: 89.2 fL (ref 80.0–100.0)
MONO ABS: 1 10*3/uL — AB (ref 0.2–0.9)
Monocytes Relative: 10 %
Neutro Abs: 8.7 10*3/uL — ABNORMAL HIGH (ref 1.4–6.5)
Neutrophils Relative %: 82 %
PLATELETS: 133 10*3/uL — AB (ref 150–440)
RBC: 4.71 MIL/uL (ref 3.80–5.20)
RDW: 14 % (ref 11.5–14.5)
WBC: 10.6 10*3/uL (ref 3.6–11.0)

## 2017-08-19 LAB — URINALYSIS, COMPLETE (UACMP) WITH MICROSCOPIC
BILIRUBIN URINE: NEGATIVE
Glucose, UA: 150 mg/dL — AB
KETONES UR: NEGATIVE mg/dL
NITRITE: NEGATIVE
Protein, ur: 100 mg/dL — AB
Specific Gravity, Urine: 1.011 (ref 1.005–1.030)
pH: 7 (ref 5.0–8.0)

## 2017-08-19 LAB — COMPREHENSIVE METABOLIC PANEL
ALT: 14 U/L (ref 14–54)
ANION GAP: 9 (ref 5–15)
AST: 27 U/L (ref 15–41)
Albumin: 3.6 g/dL (ref 3.5–5.0)
Alkaline Phosphatase: 93 U/L (ref 38–126)
BUN: 17 mg/dL (ref 6–20)
CALCIUM: 10.5 mg/dL — AB (ref 8.9–10.3)
CO2: 31 mmol/L (ref 22–32)
Chloride: 102 mmol/L (ref 101–111)
Creatinine, Ser: 0.8 mg/dL (ref 0.44–1.00)
GFR calc non Af Amer: >60 mL/min (ref >60)
GLUCOSE: 182 mg/dL — AB (ref 65–99)
POTASSIUM: 3.5 mmol/L (ref 3.5–5.1)
SODIUM: 142 mmol/L (ref 135–145)
Total Bilirubin: 0.5 mg/dL (ref 0.3–1.2)
Total Protein: 7 g/dL (ref 6.5–8.1)

## 2017-08-19 LAB — TROPONIN I
TROPONIN I: 0.06 ng/mL — AB (ref <0.03)
TROPONIN I: 0.05 ng/mL — AB (ref <0.03)
TROPONIN I: 0.04 ng/mL — AB (ref <0.03)
Troponin I: 0.05 ng/mL (ref <0.03)

## 2017-08-19 MED ORDER — DEXTROSE 5 % IV SOLN
1.0000 g | INTRAVENOUS | Status: DC
  Filled 2017-08-19: qty 10

## 2017-08-19 MED ORDER — ONDANSETRON HCL 4 MG PO TABS: 4.0000 mg | ORAL_TABLET | Freq: Four times a day (QID) | ORAL | Status: DC | PRN

## 2017-08-19 MED ORDER — DIVALPROEX SODIUM 125 MG PO CSDR
250.0000 mg | DELAYED_RELEASE_CAPSULE | Freq: Two times a day (BID) | ORAL | Status: DC
  Administered 2017-08-19 – 2017-08-22 (×7): 250 mg via ORAL
  Filled 2017-08-19 (×7): qty 2

## 2017-08-19 MED ORDER — ASPIRIN 81 MG PO CHEW
81.0000 mg | CHEWABLE_TABLET | Freq: Every day | ORAL | Status: DC
  Administered 2017-08-19 – 2017-08-22 (×4): 81 mg via ORAL
  Filled 2017-08-19 (×4): qty 1

## 2017-08-19 MED ORDER — ALENDRONATE SODIUM 70 MG PO TABS: 70.0000 mg | ORAL_TABLET | ORAL | Status: DC

## 2017-08-19 MED ORDER — MIRTAZAPINE 15 MG PO TABS
7.5000 mg | ORAL_TABLET | Freq: Every day | ORAL | Status: DC
  Administered 2017-08-19 – 2017-08-21 (×3): 7.5 mg via ORAL
  Filled 2017-08-19 (×3): qty 1

## 2017-08-19 MED ORDER — CEPHALEXIN 500 MG PO CAPS
500.0000 mg | ORAL_CAPSULE | Freq: Two times a day (BID) | ORAL | Status: DC
  Administered 2017-08-19 – 2017-08-21 (×5): 500 mg via ORAL
  Filled 2017-08-19 (×5): qty 1

## 2017-08-19 MED ORDER — NITROGLYCERIN 2 % TD OINT
1.0000 [in_us] | TOPICAL_OINTMENT | Freq: Once | TRANSDERMAL | Status: AC
  Administered 2017-08-19: 1 [in_us] via TOPICAL
  Filled 2017-08-19: qty 1

## 2017-08-19 MED ORDER — ACETAMINOPHEN 325 MG PO TABS: 650.0000 mg | ORAL_TABLET | Freq: Four times a day (QID) | ORAL | Status: DC | PRN

## 2017-08-19 MED ORDER — ASPIRIN 81 MG PO CHEW
324.0000 mg | CHEWABLE_TABLET | Freq: Once | ORAL | Status: AC
  Administered 2017-08-19: 324 mg via ORAL
  Filled 2017-08-19: qty 4

## 2017-08-19 MED ORDER — DONEPEZIL HCL 5 MG PO TABS
5.0000 mg | ORAL_TABLET | Freq: Every day | ORAL | Status: DC
  Administered 2017-08-20 – 2017-08-21 (×2): 5 mg via ORAL
  Filled 2017-08-19 (×3): qty 1

## 2017-08-19 MED ORDER — ACETAMINOPHEN 650 MG RE SUPP: 650.0000 mg | Freq: Four times a day (QID) | RECTAL | Status: DC | PRN

## 2017-08-19 MED ORDER — CEFTRIAXONE SODIUM IN DEXTROSE 20 MG/ML IV SOLN: 1.0000 g | INTRAVENOUS | Status: DC

## 2017-08-19 MED ORDER — OLANZAPINE 5 MG PO TABS: 5.0000 mg | ORAL_TABLET | Freq: Every day | ORAL | Status: DC

## 2017-08-19 MED ORDER — CARVEDILOL 3.125 MG PO TABS
3.1250 mg | ORAL_TABLET | Freq: Two times a day (BID) | ORAL | Status: DC
  Administered 2017-08-19 – 2017-08-21 (×6): 3.125 mg via ORAL
  Filled 2017-08-19 (×7): qty 1

## 2017-08-19 MED ORDER — SENNOSIDES-DOCUSATE SODIUM 8.6-50 MG PO TABS: 1.0000 | ORAL_TABLET | Freq: Every evening | ORAL | Status: DC | PRN

## 2017-08-19 MED ORDER — LISINOPRIL 10 MG PO TABS
10.0000 mg | ORAL_TABLET | Freq: Every day | ORAL | Status: DC
  Administered 2017-08-19 – 2017-08-21 (×3): 10 mg via ORAL
  Filled 2017-08-19 (×4): qty 1

## 2017-08-19 MED ORDER — ONDANSETRON HCL 4 MG/2ML IJ SOLN: 4.0000 mg | Freq: Four times a day (QID) | INTRAMUSCULAR | Status: DC | PRN

## 2017-08-19 MED ORDER — SODIUM CHLORIDE 0.9 % IV SOLN
INTRAVENOUS | Status: DC
  Administered 2017-08-19: 06:00:00 via INTRAVENOUS

## 2017-08-19 MED ORDER — HYDRALAZINE HCL 20 MG/ML IJ SOLN: 10.0000 mg | INTRAMUSCULAR | Status: DC | PRN

## 2017-08-19 MED ORDER — ENOXAPARIN SODIUM 40 MG/0.4ML ~~LOC~~ SOLN
40.0000 mg | SUBCUTANEOUS | Status: DC
  Administered 2017-08-19 – 2017-08-21 (×3): 40 mg via SUBCUTANEOUS
  Filled 2017-08-19 (×3): qty 0.4

## 2017-08-19 MED ORDER — OLANZAPINE 2.5 MG PO TABS
2.5000 mg | ORAL_TABLET | Freq: Every day | ORAL | Status: DC
  Administered 2017-08-19: 2.5 mg via ORAL
  Filled 2017-08-19: qty 1

## 2017-08-19 MED ORDER — VITAMIN D 1000 UNITS PO TABS
1000.0000 [IU] | ORAL_TABLET | Freq: Every day | ORAL | Status: DC
  Administered 2017-08-19 – 2017-08-22 (×4): 1000 [IU] via ORAL
  Filled 2017-08-19 (×4): qty 1

## 2017-08-19 MED ORDER — LATANOPROST 0.005 % OP SOLN
1.0000 [drp] | Freq: Every day | OPHTHALMIC | Status: DC
  Administered 2017-08-19 – 2017-08-21 (×3): 1 [drp] via OPHTHALMIC
  Filled 2017-08-19: qty 2.5

## 2017-08-19 MED ORDER — RISPERIDONE 0.5 MG PO TABS
0.5000 mg | ORAL_TABLET | Freq: Every day | ORAL | Status: DC
  Administered 2017-08-19 – 2017-08-21 (×3): 0.5 mg via ORAL
  Filled 2017-08-19 (×3): qty 1

## 2017-08-19 MED ORDER — SODIUM CHLORIDE 0.9 % IV BOLUS (SEPSIS)
1000.0000 mL | Freq: Once | INTRAVENOUS | Status: AC
  Administered 2017-08-19: 1000 mL via INTRAVENOUS

## 2017-08-19 MED ORDER — CEFTRIAXONE SODIUM IN DEXTROSE 20 MG/ML IV SOLN
1.0000 g | Freq: Once | INTRAVENOUS | Status: AC
  Administered 2017-08-19: 1 g via INTRAVENOUS
  Filled 2017-08-19: qty 50

## 2017-08-19 NOTE — Progress Notes (Signed)
Pt arrived to floor via stretcher. Pt alert to self. Telemetry monitor applied and called to CCMD. Yellow socks placed. Skin assessed

## 2017-08-19 NOTE — ED Provider Notes (Signed)
[facility] Emergency Department Provider Note   ____________________________________________   First MD Initiated Contact with Patient 08/19/17 0310     (approximate)  I have reviewed the triage vital signs and the nursing notes.   HISTORY  Chief Complaint Altered Mental Status  history limited by dementia  HPI Yesenia Leonard is a 83 y.o. female brought to the ED from [place] [facility] with a chief complaint of altered mental state. Per EMS, staff reported to them that the patient would not calm down, would not sleep and was agitated. History of UTI with similar symptoms of staff was concerned patient has a UTI. Patient also had a fall previously in the evening with small left foot skin tear. Rest of history is unobtainable secondary to patient's dementia.   Past Medical History:  Diagnosis Date  . Dementia   . Hypercalcemia   . Hypertension   . Hypokalemia   . Osteoporosis   . PPD positive   . Pre-diabetes     Patient Active Problem List   Diagnosis Date Noted  . S/P ORIF (open reduction internal fixation) fracture 12/22/2016  . Hypotension 12/22/2016  . Acute posthemorrhagic anemia 12/22/2016  . H/O transfusion of packed red blood cells 12/22/2016  . Dizziness 12/22/2016  . Orthostatic hypotension 12/22/2016  . Thrombocytopenia (HCC) 12/22/2016  . Hip fracture (HCC) 12/19/2016  . Alzheimer's dementia 12/04/2016    Past Surgical History:  Procedure Laterality Date  . APPENDECTOMY    . INTRAMEDULLARY (IM) NAIL INTERTROCHANTERIC Right 12/19/2016   Procedure: INTRAMEDULLARY (IM) NAIL INTERTROCHANTRIC;  Surgeon: Christena Flake, MD;  Location: [facility] ORS;  Service: Orthopedics;  Laterality: Right;    Prior to Admission medications   Medication Sig Start Date End Date Taking? Authorizing Provider  acetaminophen (TYLENOL) 500 MG tablet Take 500 mg by mouth every 6 (six) hours as needed.    [provider]  alendronate (FOSAMAX) 70 MG  tablet Take 70 mg by mouth once a week. Take with a full glass of water on an empty stomach.    [provider]  aspirin 81 MG chewable tablet Chew 81 mg by mouth daily.    [provider]  carvedilol (COREG) 3.125 MG tablet Take 3.125 mg by mouth 2 (two) times daily with a meal.    [provider]  cholecalciferol (VITAMIN D) 1000 units tablet Take 1,000 Units by mouth daily.    [provider]  docusate sodium (COLACE) 100 MG capsule Take 1 capsule (100 mg total) by mouth 2 (two) times daily. Patient not taking: Reported on 06/13/2017 12/20/16   [name], MD  donepezil (ARICEPT) 5 MG tablet Take 5 mg by mouth at bedtime.    [provider]  enoxaparin (LOVENOX) 40 MG/0.4ML injection Inject 0.4 mLs (40 mg total) into the skin daily. Patient not taking: Reported on 06/13/2017 12/21/16   [name], MD  ferrous sulfate 325 (65 FE) MG tablet Take 1 tablet (325 mg total) by mouth 3 (three) times daily with meals. Patient not taking: Reported on 06/13/2017 12/22/16   [name], MD  HYDROcodone-acetaminophen (NORCO/VICODIN) 5-325 MG tablet Take 1 tablet by mouth every 6 (six) hours as needed for moderate pain. Patient not taking: Reported on 06/13/2017 12/23/16   [name], MD  latanoprost (XALATAN) 0.005 % ophthalmic solution Place 1 drop into both eyes at bedtime. 11/28/16   [provider]  LORazepam (ATIVAN) 1 MG tablet Take 1 tablet (1 mg total) by mouth every 8 (  eight) hours as needed (agitation). Patient taking differently: Take 1 mg by mouth 2 (two) times daily as needed (agitation).  12/23/16 12/23/17  [name], MD  mirtazapine (REMERON) 7.5 MG tablet Take 7.5 mg by mouth at bedtime.    [provider]  OLANZapine (ZYPREXA) 2.5 MG tablet Take 1 tablet (2.5 mg total) by mouth daily. 06/22/17   Myrna Blazer, MD  OLANZapine (ZYPREXA) 5 MG tablet Take 1 tablet (5 mg total) by mouth at bedtime. 06/22/17 06/22/18  Schaevitz, Myra Rude, MD    Allergies Patient has no known allergies.  Family History  Problem Relation Age of Onset  . Hypertension Mother     Social History Social History  Substance Use Topics  . Smoking status: Never Smoker  . Smokeless tobacco: Never Used  . Alcohol use No    Review of Systems  Constitutional: No fever/chills. Eyes: No visual changes. ENT: No sore throat. Cardiovascular: Denies chest pain. Respiratory: Denies shortness of breath. Gastrointestinal: No abdominal pain.  No nausea, no vomiting.  No diarrhea.  No constipation. Genitourinary: Negative for dysuria. Musculoskeletal: positive for skin tear on the left foot. Negative for back pain. Skin: Negative for rash. Neurological: positive for altered mental state. Negative for headaches, focal weakness or numbness. 10 point review of systems limited by dementia.  ____________________________________________   PHYSICAL EXAM:  VITAL SIGNS: ED Triage Vitals  Enc Vitals Group     BP 08/19/17 0250 (!) 199/112     Pulse Rate 08/19/17 0250 (!) 112     Resp 08/19/17 0250 20     Temp 08/19/17 0250 97.8 F (36.6 C)     Temp Source 08/19/17 0250 Oral     SpO2 08/19/17 0250 96 %     Weight 08/19/17 0251 165 lb 7 oz (75 kg)     Height 08/19/17 0251  (1.676 m)     Head Circumference --      Peak Flow --      Pain Score --      Pain Loc --      Pain Edu? --      Excl. in GC? --     Constitutional: Alert and oriented. Well appearing and in no acute distress. Eyes: Conjunctivae are normal. PERRL. EOMI. Head: Atraumatic. Nose: No congestion/rhinnorhea. Mouth/Throat: Mucous membranes are moist.  Oropharynx non-erythematous. Neck: No stridor.  No carotid bruits. Supple neck without meningismus. Cardiovascular: Tachycardic rate, regular rhythm. Grossly normal heart sounds.  Good peripheral circulation. Respiratory: Normal respiratory effort.  No retractions. Lungs CTAB. Gastrointestinal: Soft and nontender to  light or deep palpation. No distention. No abdominal bruits. No CVA tenderness. Musculoskeletal: No lower extremity tenderness nor edema.  No joint effusions. Neurologic:  Alert and oriented to person only. Normal speech and language. No gross focal neurologic deficits are appreciated.  Skin:  Skin is warm, dry and intact. No rash noted. No petechiae. Psychiatric: Mood and affect are normal. Speech and behavior are normal.  ____________________________________________   LABS (all labs ordered are listed, but only abnormal results are displayed)  Labs Reviewed  CBC WITH DIFFERENTIAL/PLATELET - Abnormal; Notable for the following:       Result Value   Platelets 133 (*)    Neutro Abs 8.7 (*)    Lymphs Abs 0.8 (*)    Monocytes Absolute 1.0 (*)    All other components within normal limits  COMPREHENSIVE METABOLIC PANEL - Abnormal; Notable for the following:    Glucose, Bld 182 (*)  Calcium 10.5 (*)    All other components within normal limits  TROPONIN I - Abnormal; Notable for the following:    Troponin I 0.05 (*)    All other components within normal limits  URINALYSIS, COMPLETE (UACMP) WITH MICROSCOPIC - Abnormal; Notable for the following:    Color, Urine STRAW (*)    APPearance CLEAR (*)    Glucose, UA 150 (*)    Hgb urine dipstick MODERATE (*)    Protein, ur 100 (*)    Leukocytes, UA MODERATE (*)    Bacteria, UA RARE (*)    Squamous Epithelial / LPF 0-5 (*)    All other components within normal limits  URINE CULTURE   ____________________________________________  EKG  ED ECG REPORT I, Hutton Pellicane J, the attending physician, personally viewed and interpreted this ECG.   Date: 08/19/2017  EKG Time: 0251  Rate: 111  Rhythm: sinus tachycardia  Axis: normal  Intervals:none  ST&T Change: nonspecific  ____________________________________________  RADIOLOGY  Dg Chest Port 1 View  Result Date: 08/19/2017 CLINICAL DATA:  Altered mental status. Patient fell earlier in  the night. EXAM: PORTABLE CHEST 1 VIEW COMPARISON:  06/14/2017 FINDINGS: Shallow inspiration. Heart size and pulmonary vascularity are normal. Linear scarring in the left lung base is unchanged since prior study. No airspace disease or consolidation in the lungs. No pneumothorax. No pleural effusions. Calcification of the aorta. Degenerative changes in the spine and shoulders. IMPRESSION: No evidence of active pulmonary disease.  Aortic atherosclerosis. Electronically Signed   By: Burman Nieves M.D.   On: 08/19/2017 03:33    ____________________________________________   PROCEDURES  Procedure(s) performed: None  Procedures  Critical Care performed: No  ____________________________________________   INITIAL IMPRESSION / ASSESSMENT AND PLAN / ED COURSE  Pertinent labs & imaging results that were available during my care of the patient were reviewed by me and considered in my medical decision making (see chart for details).  83 year old female with Alzheimer's dementia sent for evaluation of altered mental state. patient is calm and cooperative at this time. Will obtain screening lab work, urinalysis, CT head and reassess.  Clinical Course as of Aug 19 410  Tue Aug 19, 2017  0408 Troponin and urinalysis noted. Will initiate IV antibiotics. Discuss with hospitalist to evaluate patient in the emergency department for admission.  [JS]    Clinical Course User Index [JS] Irean Hong, MD     ____________________________________________   FINAL CLINICAL IMPRESSION(S) / ED DIAGNOSES  Final diagnoses:  Altered mental status, unspecified altered mental status type  Lower urinary tract infectious disease  Elevated troponin  Essential hypertension      NEW MEDICATIONS STARTED DURING THIS VISIT:  New Prescriptions   No medications on file     Note:  This document was prepared using Dragon voice recognition software and may include unintentional dictation errors.    Irean Hong, MD 08/19/17 [phone]

## 2017-08-19 NOTE — Progress Notes (Signed)
[facility] - [place] at [facility]   PATIENT NAME: Yesenia Leonard    MR#:  [id number]  DATE OF BIRTH:  08/08/1924  SUBJECTIVE:  CHIEF COMPLAINT:   Chief Complaint  Patient presents with  . Altered Mental Status   Sent from NH with more confusion, have UTI.  REVIEW OF SYSTEMS:   Pt have confusion, not able to give details. ROS  DRUG ALLERGIES:  No Known Allergies  VITALS:  Blood pressure 103/68, pulse 83, temperature 97.9 F (36.6 C), resp. rate 18, height  (1.676 m), weight 69.9 kg (154 lb), SpO2 90 %.  PHYSICAL EXAMINATION:  GENERAL:  83 y.o.-year-old patient lying in the bed with no acute distress.  EYES: Pupils equal, round, reactive to light and accommodation. No scleral icterus. Extraocular muscles intact.  HEENT: Head atraumatic, normocephalic. Oropharynx and nasopharynx clear.  NECK:  Supple, no jugular venous distention. No thyroid enlargement, no tenderness.  LUNGS: Normal breath sounds bilaterally, no wheezing, rales,rhonchi or crepitation. No use of accessory muscles of respiration.  CARDIOVASCULAR: S1, S2 normal. No murmurs, rubs, or gallops.  ABDOMEN: Soft, nontender, nondistended. Bowel sounds present. No organomegaly or mass.  EXTREMITIES: No pedal edema, cyanosis, or clubbing.  NEUROLOGIC: Cranial nerves II through XII are intact. Muscle strength 3-4/5 in all extremities. Sensation intact. Gait not checked.  PSYCHIATRIC: The patient is alert and oriented x 1.  SKIN: No obvious rash, lesion, or ulcer.   Physical Exam LABORATORY PANEL:   CBC  Recent Labs Lab 08/19/17 0321  WBC 10.6  HGB 14.3  HCT 42.0  PLT 133*   ------------------------------------------------------------------------------------------------------------------  Chemistries   Recent Labs Lab 08/19/17 0321  NA 142  K 3.5  CL 102  CO2 31  GLUCOSE 182*  BUN 17  CREATININE 0.80  CALCIUM 10.5*  AST 27  ALT 14  ALKPHOS 93  BILITOT 0.5    ------------------------------------------------------------------------------------------------------------------  Cardiac Enzymes  Recent Labs Lab 08/19/17 0321 08/19/17 0931  TROPONINI 0.05* 0.06*   ------------------------------------------------------------------------------------------------------------------  RADIOLOGY:  Ct Head Wo Contrast  Result Date: 08/19/2017 CLINICAL DATA:  Altered level of consciousness. Patient fell earlier tonight. EXAM: CT HEAD WITHOUT CONTRAST TECHNIQUE: Contiguous axial images were obtained from the base of the skull through the vertex without intravenous contrast. COMPARISON:  06/18/2017 FINDINGS: Brain: Diffuse cerebral atrophy. Ventricular dilatation consistent with central atrophy. Low-attenuation changes in the deep white matter consistent with small vessel ischemia. No mass effect or midline shift. No abnormal extra-axial fluid collections. Gray-white matter junctions are distinct. Basal cisterns are not effaced. No acute intracranial hemorrhage. Vascular: Vascular calcifications are present in the internal carotid arteries. Skull: Calvarium appears intact. Sinuses/Orbits: Paranasal sinuses are clear. Partial opacification of the right mastoid air cells. Other: No significant changes since previous study. IMPRESSION: No acute intracranial abnormalities. Chronic atrophy and small vessel ischemic changes. Right mastoid effusion. Electronically Signed   By: Burman Nieves M.D.   On: 08/19/2017 04:21   Dg Chest Port 1 View  Result Date: 08/19/2017 CLINICAL DATA:  Altered mental status. Patient fell earlier in the night. EXAM: PORTABLE CHEST 1 VIEW COMPARISON:  06/14/2017 FINDINGS: Shallow inspiration. Heart size and pulmonary vascularity are normal. Linear scarring in the left lung base is unchanged since prior study. No airspace disease or consolidation in the lungs. No pneumothorax. No pleural effusions. Calcification of the aorta. Degenerative  changes in the spine and shoulders. IMPRESSION: No evidence of active pulmonary disease.  Aortic atherosclerosis. Electronically Signed   By: Marisa Cyphers.D.  On: 08/19/2017 03:33    ASSESSMENT AND PLAN:   Active Problems:   Altered mental status  1. Urinary tract infection- ROCEPHIN, FOLLOW UR CX 2. Delirium secondary to UTI- altered mental status     Monitor with rx of UTI 3. Hypertension- cont coreg, lisinopril. 4. Abnormal troponin- stable on further follow up, likely stress induced. No further work ups.    All the records are reviewed and case discussed with Care Management/Social Workerr. Management plans discussed with the patient, family and they are in agreement.  CODE STATUS: Full.  TOTAL TIME TAKING CARE OF THIS PATIENT: 35 minutes.     POSSIBLE D/C IN 1-2 DAYS, DEPENDING ON CLINICAL CONDITION.   Altamese Dilling M.D on 08/19/2017   Between 7am to 6pm - Pager - [phone]  After 6pm go to [email or web address] - Social research officer, government  [facility] [place] [facility]  Office  [phone]  CC: Primary care physician; Center, [facility]  Note: This dictation was prepared with Dragon dictation along with smaller phrase technology. Any transcriptional errors that result from this process are unintentional.

## 2017-08-19 NOTE — ED Notes (Signed)
Assisted patient up to bathroom, with 2 people.  Notice abrasion in center of back.

## 2017-08-19 NOTE — Progress Notes (Signed)
Pt continues to be confused and agitated/ has pulled out multiple IVs/ MD paged/ ok to leave out IV/ abx switched to PO

## 2017-08-19 NOTE — ED Triage Notes (Signed)
Patient to [facility] via EMS from local nursing facility.  Per EMS staff reported that patient would not calm down, would not sleep and was tearing up her room and they were concerned she might have a UTI.  Patient also had a fall earlier in the night and has small skin tear on top of left foot, no active bleeding at this time.

## 2017-08-19 NOTE — H&P (Signed)
[facility] - [place] at [facility]   PATIENT NAME: Yesenia Leonard    MR#:  [id number]  DATE OF BIRTH:  1924/10/11  DATE OF ADMISSION:  08/19/2017  PRIMARY CARE PHYSICIAN: Center, [facility]   REQUESTING/REFERRING PHYSICIAN:   CHIEF COMPLAINT:   Chief Complaint  Patient presents with  . Altered Mental Status    HISTORY OF PRESENT ILLNESS: Yesenia Leonard  is a 83 y.o. female with a known history of Alzheimers dementia, hypercalcemia, hypertension, osteoporosis and is a resident of a [facility] was referred to the emergency room for agitation and confusion. Patient was agitated and not sleeping and tearing up in her room. She was concerned that she has urinary tract infection. She also had a fall last night and had a small skin tear on the top of the left foot. Patient does not have any pain. He is awake and alert and responds to verbal commands. She was evaluated was found to have urinary tract infection and was given IV Rocephin antibiotic in the emergency room. She was worked up with CT head which showed no acute abnormality. Patient's troponin was borderline. No complaints of any chest pain, shortness of breath.  PAST MEDICAL HISTORY:   Past Medical History:  Diagnosis Date  . Dementia   . Hypercalcemia   . Hypertension   . Hypokalemia   . Osteoporosis   . PPD positive   . Pre-diabetes     PAST SURGICAL HISTORY: Past Surgical History:  Procedure Laterality Date  . APPENDECTOMY    . INTRAMEDULLARY (IM) NAIL INTERTROCHANTERIC Right 12/19/2016   Procedure: INTRAMEDULLARY (IM) NAIL INTERTROCHANTRIC;  Surgeon: Christena Flake, MD;  Location: [facility] ORS;  Service: Orthopedics;  Laterality: Right;    SOCIAL HISTORY:  Social History  Substance Use Topics  . Smoking status: Never Smoker  . Smokeless tobacco: Never Used  . Alcohol use No    FAMILY HISTORY:  Family History  Problem Relation Age of Onset  . Hypertension Mother      DRUG ALLERGIES: No Known Allergies  REVIEW OF SYSTEMS:   CONSTITUTIONAL: No fever, has weakness.  EYES: No blurred or double vision.  EARS, NOSE, AND THROAT: No tinnitus or ear pain.  RESPIRATORY: No cough, shortness of breath, wheezing or hemoptysis.  CARDIOVASCULAR: No chest pain, orthopnea, edema.  GASTROINTESTINAL: No nausea, vomiting, diarrhea or abdominal pain.  GENITOURINARY: Has dysuria, no hematuria.  ENDOCRINE: No polyuria, nocturia,  HEMATOLOGY: No anemia, easy bruising or bleeding SKIN: No rash or lesion. MUSCULOSKELETAL: No joint pain or arthritis.   NEUROLOGIC: No tingling, numbness, weakness.  PSYCHIATRY: could not be assessed  MEDICATIONS AT HOME:  Prior to Admission medications   Medication Sig Start Date End Date Taking? Authorizing Provider  acetaminophen (TYLENOL) 500 MG tablet Take 500 mg by mouth every 6 (six) hours as needed.    [provider]  alendronate (FOSAMAX) 70 MG tablet Take 70 mg by mouth once a week. Take with a full glass of water on an empty stomach.    [provider]  aspirin 81 MG chewable tablet Chew 81 mg by mouth daily.    [provider]  carvedilol (COREG) 3.125 MG tablet Take 3.125 mg by mouth 2 (two) times daily with a meal.    [provider]  cholecalciferol (VITAMIN D) 1000 units tablet Take 1,000 Units by mouth daily.    [provider]  donepezil (ARICEPT) 5 MG tablet Take 5 mg by mouth at bedtime.  [provider]  ferrous sulfate 325 (65 FE) MG tablet Take 1 tablet (325 mg total) by mouth 3 (three) times daily with meals. Patient not taking: Reported on 06/13/2017 12/22/16   [name], MD  HYDROcodone-acetaminophen (NORCO/VICODIN) 5-325 MG tablet Take 1 tablet by mouth every 6 (six) hours as needed for moderate pain. Patient not taking: Reported on 06/13/2017 12/23/16   [name], MD  latanoprost (XALATAN) 0.005 % ophthalmic solution Place 1 drop into both eyes at  bedtime. 11/28/16   [provider]  LORazepam (ATIVAN) 1 MG tablet Take 1 tablet (1 mg total) by mouth every 8 (eight) hours as needed (agitation). Patient taking differently: Take 1 mg by mouth 2 (two) times daily as needed (agitation).  12/23/16 12/23/17  [name], MD  mirtazapine (REMERON) 7.5 MG tablet Take 7.5 mg by mouth at bedtime.    [provider]  OLANZapine (ZYPREXA) 2.5 MG tablet Take 1 tablet (2.5 mg total) by mouth daily. 06/22/17   Myrna Blazer, MD  OLANZapine (ZYPREXA) 5 MG tablet Take 1 tablet (5 mg total) by mouth at bedtime. 06/22/17 06/22/18  Myrna Blazer, MD      PHYSICAL EXAMINATION:   VITAL SIGNS: Blood pressure (!) 199/112, pulse (!) 112, temperature 97.8 F (36.6 C), temperature source Oral, resp. rate 20, height  (1.676 m), weight 75 kg (165 lb 7 oz), SpO2 96 %.  GENERAL:  83 y.o.-year-old patient lying in the bed with no acute distress.  EYES: Pupils equal, round, reactive to light and accommodation. No scleral icterus. Extraocular muscles intact.  HEENT: Head atraumatic, normocephalic. Oropharynx and nasopharynx clear.  NECK:  Supple, no jugular venous distention. No thyroid enlargement, no tenderness.  LUNGS: Normal breath sounds bilaterally, no wheezing, rales,rhonchi or crepitation. No use of accessory muscles of respiration.  CARDIOVASCULAR: S1, S2 normal. No murmurs, rubs, or gallops.  ABDOMEN: Soft, nontender, nondistended. Bowel sounds present. No organomegaly or mass.  EXTREMITIES: 1 plus pedal edema,  No cyanosis, or clubbing.  NEUROLOGIC: Cranial nerves II through XII are intact. Muscle strength 5/5 in all extremities. Sensation intact. Gait not checked. Awake, oriented to self and place. PSYCHIATRIC: periods of agitation SKIN: No obvious rash, lesion, or ulcer.   LABORATORY PANEL:   CBC  Recent Labs Lab 08/19/17 0321  WBC 10.6  HGB 14.3  HCT 42.0  PLT 133*  MCV 89.2  MCH 30.3  MCHC 34.0  RDW  14.0  LYMPHSABS 0.8*  MONOABS 1.0*  EOSABS 0.0  BASOSABS 0.1   ------------------------------------------------------------------------------------------------------------------  Chemistries   Recent Labs Lab 08/19/17 0321  NA 142  K 3.5  CL 102  CO2 31  GLUCOSE 182*  BUN 17  CREATININE 0.80  CALCIUM 10.5*  AST 27  ALT 14  ALKPHOS 93  BILITOT 0.5   ------------------------------------------------------------------------------------------------------------------ estimated creatinine clearance is 47.4 mL/min (by C-G formula based on SCr of 0.8 mg/dL). ------------------------------------------------------------------------------------------------------------------ No results for input(s): TSH, T4TOTAL, T3FREE, THYROIDAB in the last 72 hours.  Invalid input(s): FREET3   Coagulation profile No results for input(s): INR, PROTIME in the last 168 hours. ------------------------------------------------------------------------------------------------------------------- No results for input(s): DDIMER in the last 72 hours. -------------------------------------------------------------------------------------------------------------------  Cardiac Enzymes  Recent Labs Lab 08/19/17 0321  TROPONINI 0.05*   ------------------------------------------------------------------------------------------------------------------ Invalid input(s): POCBNP  ---------------------------------------------------------------------------------------------------------------  Urinalysis    Component Value Date/Time   COLORURINE STRAW (A) 08/19/2017 0321   APPEARANCEUR CLEAR (A) 08/19/2017 0321   LABSPEC 1.011 08/19/2017 0321   PHURINE 7.0 08/19/2017 0321   GLUCOSEU  150 (A) 08/19/2017 0321   HGBUR MODERATE (A) 08/19/2017 0321   BILIRUBINUR NEGATIVE 08/19/2017 0321   KETONESUR NEGATIVE 08/19/2017 0321   PROTEINUR 100 (A) 08/19/2017 0321   NITRITE NEGATIVE 08/19/2017 0321   LEUKOCYTESUR  MODERATE (A) 08/19/2017 0321     RADIOLOGY: Ct Head Wo Contrast  Result Date: 08/19/2017 CLINICAL DATA:  Altered level of consciousness. Patient fell earlier tonight. EXAM: CT HEAD WITHOUT CONTRAST TECHNIQUE: Contiguous axial images were obtained from the base of the skull through the vertex without intravenous contrast. COMPARISON:  06/18/2017 FINDINGS: Brain: Diffuse cerebral atrophy. Ventricular dilatation consistent with central atrophy. Low-attenuation changes in the deep white matter consistent with small vessel ischemia. No mass effect or midline shift. No abnormal extra-axial fluid collections. Gray-white matter junctions are distinct. Basal cisterns are not effaced. No acute intracranial hemorrhage. Vascular: Vascular calcifications are present in the internal carotid arteries. Skull: Calvarium appears intact. Sinuses/Orbits: Paranasal sinuses are clear. Partial opacification of the right mastoid air cells. Other: No significant changes since previous study. IMPRESSION: No acute intracranial abnormalities. Chronic atrophy and small vessel ischemic changes. Right mastoid effusion. Electronically Signed   By: Burman Nieves M.D.   On: 08/19/2017 04:21   Dg Chest Port 1 View  Result Date: 08/19/2017 CLINICAL DATA:  Altered mental status. Patient fell earlier in the night. EXAM: PORTABLE CHEST 1 VIEW COMPARISON:  06/14/2017 FINDINGS: Shallow inspiration. Heart size and pulmonary vascularity are normal. Linear scarring in the left lung base is unchanged since prior study. No airspace disease or consolidation in the lungs. No pneumothorax. No pleural effusions. Calcification of the aorta. Degenerative changes in the spine and shoulders. IMPRESSION: No evidence of active pulmonary disease.  Aortic atherosclerosis. Electronically Signed   By: Burman Nieves M.D.   On: 08/19/2017 03:33    EKG: Orders placed or performed during the hospital encounter of 08/19/17  . EKG 12-Lead  . EKG 12-Lead     IMPRESSION AND PLAN: 83 year old female patient with history of alzheimers dementia, hypertension, osteoporosis presented to the emergency room with periods of agitation and confusion and dysuria. Admitting diagnosis 1. Urinary tract infection 2. Delirium secondary to UTI 3. Hypertension 4. Abnormal troponin Treatment plan Admit patient to medical floor Start patient on IV Rocephin antibiotic Gentle IV fluid hydration Elevated troponins could be secondary to demand ischemia Cycle troponin Follow-up cultures  All the records are reviewed and case discussed with ED provider. Management plans discussed with the patient, family and they are in agreement.  CODE STATUS:FULL CODE Code Status History    Date Active Date Inactive Code Status Order ID Comments User Context   12/19/2016  6:59 PM 12/19/2016  6:59 PM Full Code [id number]  Auburn Bilberry, MD Inpatient   12/19/2016  6:59 PM 12/23/2016 10:45 PM Full Code [id number]  Poggi, Excell Seltzer, MD Inpatient       TOTAL TIME TAKING CARE OF THIS PATIENT: 50 minutes.    Ihor [place] M.D on 08/19/2017 at 4:54 AM  Between 7am to 6pm - Pager - [phone]  After 6pm go to [email or web address] - password [email or web address] [facility]  [facility] [place] [facility]  Office  [phone]  CC: Primary care physician; Center, [facility]

## 2017-08-19 NOTE — Clinical Social Work Note (Addendum)
CSW contacted patient's legal guardian Kylie through DSS, contact number is [phone].  Patient is from [facility] unit, and plan is to return once she is medically ready for discharge and orders have been received.  Formal assessment to follow.  [name]. Nafis Farnan, MSW, Theresia Majors [phone]  08/19/2017 5:18 PM

## 2017-08-20 MED ORDER — HALOPERIDOL LACTATE 5 MG/ML IJ SOLN
2.0000 mg | Freq: Once | INTRAMUSCULAR | Status: AC
  Administered 2017-08-21: 2 mg via INTRAMUSCULAR
  Filled 2017-08-20: qty 1

## 2017-08-20 NOTE — Progress Notes (Signed)
Patient refusing to wear telemetry box, attempted to educated patient on importance of wearing box, patient is confused, and continues to refuse. MD Gouru made aware. No new orders at this time. Will continue to monitor.   Mayra Neer M

## 2017-08-20 NOTE — Progress Notes (Signed)
[facility] - [place] at [facility]   PATIENT NAME: Yesenia Leonard    MR#:  [id number]  DATE OF BIRTH:  December 21, 1923  SUBJECTIVE:  CHIEF COMPLAINT:   Chief Complaint  Patient presents with  . Altered Mental Status  Patient confused  but appears to be at baseline  REVIEW OF SYSTEMS:   Pt have confusion, Has dementia ROS  DRUG ALLERGIES:  No Known Allergies  VITALS:  Blood pressure (!) 156/76, pulse 90, temperature 98.4 F (36.9 C), temperature source Oral, resp. rate 18, height  (1.676 m), weight 154 lb (69.9 kg), SpO2 99 %.  PHYSICAL EXAMINATION:  GENERAL:  83 y.o.-year-old patient lying in the bed with no acute distress.  EYES: Pupils equal, round, reactive to light and accommodation. No scleral icterus. Extraocular muscles intact.  HEENT: Head atraumatic, normocephalic. Oropharynx and nasopharynx clear.  NECK:  Supple, no jugular venous distention. No thyroid enlargement, no tenderness.  LUNGS: Normal breath sounds bilaterally, no wheezing, rales,rhonchi or crepitation. No use of accessory muscles of respiration.  CARDIOVASCULAR: S1, S2 normal. No murmurs, rubs, or gallops.  ABDOMEN: Soft, nontender, nondistended. Bowel sounds present. No organomegaly or mass.  EXTREMITIES: No pedal edema, cyanosis, or clubbing.  NEUROLOGIC: Cranial nerves II through XII are intact. Muscle strength 3-4/5 in all extremities. Sensation intact. Gait not checked.  PSYCHIATRIC: The patient is alert and oriented x 1.  SKIN: No obvious rash, lesion, or ulcer.   Physical Exam LABORATORY PANEL:   CBC  Recent Labs Lab 08/19/17 0321  WBC 10.6  HGB 14.3  HCT 42.0  PLT 133*   ------------------------------------------------------------------------------------------------------------------  Chemistries   Recent Labs Lab 08/19/17 0321  NA 142  K 3.5  CL 102  CO2 31  GLUCOSE 182*  BUN 17  CREATININE 0.80  CALCIUM 10.5*  AST 27  ALT 14  ALKPHOS 93  BILITOT 0.5    ------------------------------------------------------------------------------------------------------------------  Cardiac Enzymes  Recent Labs Lab 08/19/17 1519 08/19/17 2138  TROPONINI 0.05* 0.04*   ------------------------------------------------------------------------------------------------------------------  RADIOLOGY:  Ct Head Wo Contrast  Result Date: 08/19/2017 CLINICAL DATA:  Altered level of consciousness. Patient fell earlier tonight. EXAM: CT HEAD WITHOUT CONTRAST TECHNIQUE: Contiguous axial images were obtained from the base of the skull through the vertex without intravenous contrast. COMPARISON:  06/18/2017 FINDINGS: Brain: Diffuse cerebral atrophy. Ventricular dilatation consistent with central atrophy. Low-attenuation changes in the deep white matter consistent with small vessel ischemia. No mass effect or midline shift. No abnormal extra-axial fluid collections. Gray-white matter junctions are distinct. Basal cisterns are not effaced. No acute intracranial hemorrhage. Vascular: Vascular calcifications are present in the internal carotid arteries. Skull: Calvarium appears intact. Sinuses/Orbits: Paranasal sinuses are clear. Partial opacification of the right mastoid air cells. Other: No significant changes since previous study. IMPRESSION: No acute intracranial abnormalities. Chronic atrophy and small vessel ischemic changes. Right mastoid effusion. Electronically Signed   By: Burman Nieves M.D.   On: 08/19/2017 04:21   Dg Chest Port 1 View  Result Date: 08/19/2017 CLINICAL DATA:  Altered mental status. Patient fell earlier in the night. EXAM: PORTABLE CHEST 1 VIEW COMPARISON:  06/14/2017 FINDINGS: Shallow inspiration. Heart size and pulmonary vascularity are normal. Linear scarring in the left lung base is unchanged since prior study. No airspace disease or consolidation in the lungs. No pneumothorax. No pleural effusions. Calcification of the aorta. Degenerative  changes in the spine and shoulders. IMPRESSION: No evidence of active pulmonary disease.  Aortic atherosclerosis. Electronically Signed   By: Marisa Cyphers.D.  On: 08/19/2017 03:33    ASSESSMENT AND PLAN:   Active Problems:   Altered mental status  1. Urinary tract infection Gram-negative rods-continue antibiotics and await urine cultures 2.  acute encephalopathy due to UTI now improving 3. Hypertension- cont coreg, lisinopril. 4. Abnormal troponin- due to demand ischemia.    All the records are reviewed and case discussed with Care Management/Social Workerr. Management plans discussed with the patient, family and they are in agreement.  CODE STATUS: Full.  TOTAL TIME TAKING CARE OF THIS PATIENT: 35 minutes.     POSSIBLE D/C IN 1-2 DAYS, DEPENDING ON CLINICAL CONDITION.   Auburn Bilberry M.D on 08/20/2017   Between 7am to 6pm - Pager - [phone]  After 6pm go to [email or web address] - Social research officer, government  [facility] [place] [facility]  Office  [phone]  CC: Primary care physician; Center, [facility]  Note: This dictation was prepared with Dragon dictation along with smaller phrase technology. Any transcriptional errors that result from this process are unintentional.

## 2017-08-21 DIAGNOSIS — R4182 Altered mental status, unspecified: Secondary | ICD-10-CM | POA: Diagnosis present

## 2017-08-21 MED ORDER — HYDROCODONE-ACETAMINOPHEN 5-325 MG PO TABS: 1.0000 | ORAL_TABLET | Freq: Four times a day (QID) | ORAL | 0 refills | Status: AC | PRN

## 2017-08-21 MED ORDER — HALOPERIDOL LACTATE 5 MG/ML IJ SOLN: 4.0000 mg | Freq: Once | INTRAMUSCULAR | Status: DC

## 2017-08-21 MED ORDER — CEPHALEXIN 500 MG PO CAPS: 500.0000 mg | ORAL_CAPSULE | Freq: Two times a day (BID) | ORAL | 0 refills | Status: DC

## 2017-08-21 MED ORDER — LORAZEPAM 0.5 MG PO TABS: 0.5000 mg | ORAL_TABLET | Freq: Three times a day (TID) | ORAL | 0 refills | Status: AC

## 2017-08-21 MED ORDER — LORAZEPAM 1 MG PO TABS
1.0000 mg | ORAL_TABLET | Freq: Once | ORAL | Status: AC
  Administered 2017-08-21: 1 mg via ORAL
  Filled 2017-08-21: qty 1

## 2017-08-21 MED ORDER — CEFDINIR 300 MG PO CAPS
300.0000 mg | ORAL_CAPSULE | Freq: Two times a day (BID) | ORAL | 0 refills | Status: AC
Start: 2017-08-21 — End: 2017-08-26

## 2017-08-21 NOTE — Clinical Social Work Note (Signed)
Clinical Social Work Assessment  Patient Details  Name: Yesenia Leonard MRN: [id number] Date of Birth: 1923/12/13  Date of referral:  08/21/17               Reason for consult:  Facility Placement                Permission sought to share information with:  Facility Medical sales representative, Family Supports Permission granted to share information::  Yes, Release of Information Signed  Name::     Yesenia Leonard,Yesenia Leonard Legal Guardian [phone] or Adaira, [place] Daughter [phone]  [phone] or Elpidio Galea [phone] or  Hall,Lachaeta Sister [phone]   Agency::  [facility]  Relationship::     Contact Information:     Housing/Transportation Living arrangements for the past 2 months:  Assisted Living Facility Source of Information:  Facility Patient Interpreter Needed:  None Criminal Activity/Legal Involvement Pertinent to Current Situation/Hospitalization:  No - Comment as needed Significant Relationships:  Merchandiser, retail, Other Family Members Lives with:  Facility Resident Do you feel safe going back to the place where you live?  Yes Need for family participation in patient care:  Yes (Comment)  Care giving concerns:  Patient's guardian does not have any issues with returning back to ALF.   Social Worker assessment / plan:  Patient is a 83 year old female who is alert and oriented x1.  Patient is from [facility] memory care, due to dementia, assessment was completed by speaking with patient's legal guardian and chart review.  Per patient's legal guardian, she has been pleased with the care at [facility] for patient.  Patient's legal guardian states patient has been at [facility] for a few years.  Patient's guardian prefers that patient return back to [facility], Patient's legal guardian did not have any other questions or concerns.  Employment status:  Retired Health and safety inspector:  Medicare PT Recommendations:  Not assessed at this  time Information / Referral to community resources:  Other (Comment Required) ([facility] care ALF)  Patient/Family's Response to care:  Patient's legal guardian is in agreement to have patient return back to [facility] memory care.  Patient/Family's Understanding of and Emotional Response to Diagnosis, Current Treatment, and Prognosis:  Patient's guardian is aware of current treatment plan and prognosis.  Emotional Assessment Appearance:  Appears stated age Attitude/Demeanor/Rapport:    Affect (typically observed):  Appropriate, Restless, Stable Orientation:  Oriented to Self Alcohol / Substance use:  Not Applicable Psych involvement (Current and /or in the community):  No (Comment)  Discharge Needs  Concerns to be addressed:  Care Coordination Readmission within the last 30 days:  No Current discharge risk:  Cognitively Impaired Barriers to Discharge:  No Barriers Identified   Darleene Cleaver, LCSWA 08/21/2017, 2:03 PM

## 2017-08-21 NOTE — Progress Notes (Signed)
Patient discharging back to facility. Packet prepared by Minerva Areola, SW. AVS added to packet as patient is confused due to dementia. Report called to Victorino Dike RN at facility and EMS called for transport.

## 2017-08-21 NOTE — Progress Notes (Signed)
Berenice RN was able to speak with DSS regarding patient refusing to leave by EMS. DSS is going to try to see if [facility] can come pick her up. She will call back and let us know. DSS informed us that patient typically takes ativan at facility. Dr. Allena Katz notified - instructed to change haldol order to  PO ativan once for prior to discharge transportation.

## 2017-08-21 NOTE — Care Management Obs Status (Signed)
MEDICARE OBSERVATION STATUS NOTIFICATION   Patient Details  Name: Yesenia Leonard MRN: [id number] Date of Birth: 06-Jul-1924   Medicare Observation Status Notification Given:  Yes Called legal guardian- Kylie Morrow-Jennings and explained code 58.  Faxed it to her for signature.  Recevied confirmation that fax was completed but at time of this note- a signed copy has not been returned   Eber Hong, RN 08/21/2017, 1:40 PM

## 2017-08-21 NOTE — Clinical Social Work Note (Addendum)
11:30 am  CSW spoke to [facility] memory care and informed them that patient will be ready for discharge today.  [facility] requested discharge summary and FL2 to be faxed to [phone].  CSW faxed requested information.  CSW left message for Max Fickle DSS legal guardian.  4:15pm CSW was informed that Lincoln Maxin is DSS legal guardian, [phone]    Discharge was originally scheduled for today, however CSW was informed it has to be cancelled for today.  CSW was informed by Clide Cliff at [facility] memory care and DSS guardian, that they have to assess patient before she is able to return back to ALF because she has been out of ALF for more than 1 day.  CSW spoke to [place] DSS legal guardian [phone] who said [facility] will be arriving on Friday morning at 8am to assess patient and they will transport her back if they are able to.  If [employer] can not transport patient, DSS will arrange transportation.    CSW was also informed by [facility] DSS guardian that patient is only supposed to have DSS supervised visits with patient's daughter Burna Mortimer.   Windell Moulding, MSW, Theresia Majors [phone]

## 2017-08-21 NOTE — NC FL2 (Signed)
[place] MEDICAID FL2 LEVEL OF CARE SCREENING TOOL     IDENTIFICATION  Patient Name: Yesenia Leonard Birthdate: 26-Jun-1924 Sex: female Admission Date (Current Location): 08/19/2017  [facility] and [place] Number:  Chiropodist and Address:  [facility], [address] [place], [place] [address]      Provider Number: [id number]  Attending Physician Name and Address:  Auburn Bilberry, MD  Relative Name and Phone Number:     Reece,Tina Legal Guardian [phone]   Mellany, [place] Daughter [phone]  [phone]   Elpidio Galea [phone]   Hall,Lachaeta Sister [phone]         Current Level of Care: Hospital Recommended Level of Care: Memory Care [facility]) Prior Approval Number:    Date Approved/Denied:   PASRR Number:    Discharge Plan: Other (Comment) [facility])    Current Diagnoses: Patient Active Problem List   Diagnosis Date Noted  . Altered mental state 08/21/2017  . Altered mental status 08/19/2017  . S/P ORIF (open reduction internal fixation) fracture 12/22/2016  . Hypotension 12/22/2016  . Acute posthemorrhagic anemia 12/22/2016  . H/O transfusion of packed red blood cells 12/22/2016  . Dizziness 12/22/2016  . Orthostatic hypotension 12/22/2016  . Thrombocytopenia (HCC) 12/22/2016  . Hip fracture (HCC) 12/19/2016  . Alzheimer's dementia 12/04/2016    Orientation RESPIRATION BLADDER Height & Weight     Self  Normal Incontinent Weight: 151 lb (68.5 kg) Height:   (167.6 cm)  BEHAVIORAL SYMPTOMS/MOOD NEUROLOGICAL BOWEL NUTRITION STATUS   Yelling   Continent Diet  AMBULATORY STATUS COMMUNICATION OF NEEDS Skin   Supervision Verbally Normal                       Personal Care Assistance Level of Assistance  Bathing, Feeding, Dressing Bathing Assistance: Limited assistance Feeding assistance: Independent Dressing Assistance: Limited assistance      Functional Limitations Info  Sight, Hearing, Speech Sight Info: Adequate Hearing Info: Adequate Speech Info: Adequate    SPECIAL CARE FACTORS FREQUENCY                       Contractures Contractures Info: Not present    Additional Factors Info  Code Status, Allergies, Psychotropic Code Status Info: Full Code Allergies Info: NKA Psychotropic Info: divalproex (DEPAKOTE SPRINKLE) capsule 250 mg and mirtazapine (REMERON) tablet 7.5 mg and risperiDONE (RISPERDAL) tablet 0.5 mg         Current Medications (08/21/2017):  This is the current hospital active medication list Current Facility-Administered Medications  Medication Dose Route Frequency Provider Last Rate Last Dose  . acetaminophen (TYLENOL) tablet 650 mg  650 mg Oral Q6H PRN Ihor Austin, MD       Or  . acetaminophen (TYLENOL) suppository 650 mg  650 mg Rectal Q6H PRN Pyreddy, Vivien Rota, MD      . aspirin chewable tablet 81 mg  81 mg Oral Daily Pyreddy, Pavan, MD   81 mg at 08/21/17 1019  . carvedilol (COREG) tablet 3.125 mg  3.125 mg Oral BID WC Pyreddy, Vivien Rota, MD   3.125 mg at 08/21/17 0820  . cephALEXin (KEFLEX) capsule 500 mg  500 mg Oral Q12H Altamese Dilling, MD   500 mg at 08/21/17 1019  . cholecalciferol (VITAMIN D) tablet 1,000 Units  1,000 Units Oral Daily Ihor Austin, MD   1,000 Units at 08/21/17 1020  . divalproex (DEPAKOTE SPRINKLE) capsule 250 mg  250 mg Oral Q12H  Altamese Dilling, MD   250 mg at 08/21/17 1020  . donepezil (ARICEPT) tablet 5 mg  5 mg Oral QHS Ihor Austin, MD   5 mg at 08/20/17 2102  . enoxaparin (LOVENOX) injection 40 mg  40 mg Subcutaneous Q24H Ihor Austin, MD   40 mg at 08/20/17 2102  . hydrALAZINE (APRESOLINE) injection 10 mg  10 mg Intravenous Q4H PRN Pyreddy, Pavan, MD      . latanoprost (XALATAN) 0.005 % ophthalmic solution 1 drop  1 drop Both Eyes QHS Pyreddy, Vivien Rota, MD   1 drop at 08/20/17 2102  . lisinopril (PRINIVIL,ZESTRIL) tablet 10 mg  10 mg Oral Daily  Altamese Dilling, MD   10 mg at 08/21/17 1019  . mirtazapine (REMERON) tablet 7.5 mg  7.5 mg Oral QHS Altamese Dilling, MD   7.5 mg at 08/20/17 2102  . ondansetron (ZOFRAN) tablet 4 mg  4 mg Oral Q6H PRN Ihor Austin, MD       Or  . ondansetron (ZOFRAN) injection 4 mg  4 mg Intravenous Q6H PRN Pyreddy, Pavan, MD      . risperiDONE (RISPERDAL) tablet 0.5 mg  0.5 mg Oral QHS Altamese Dilling, MD   0.5 mg at 08/20/17 2102  . senna-docusate (Senokot-S) tablet 1 tablet  1 tablet Oral QHS PRN Ihor Austin, MD         Discharge Medications: Please see discharge summary for a list of discharge medications.  TAKE these medications   acetaminophen 500 MG tablet Commonly known as:  TYLENOL Take 500 mg by mouth every 6 (six) hours as needed.   alendronate 70 MG tablet Commonly known as:  FOSAMAX Take 70 mg by mouth once a week. Take with a full glass of water on an empty stomach.   alum & mag hydroxide-simeth 400-400-40 MG/5ML suspension Commonly known as:  MAALOX PLUS Take 10 mLs by mouth as needed for indigestion.   aspirin 81 MG chewable tablet Chew 81 mg by mouth daily.   carvedilol 6.25 MG tablet Commonly known as:  COREG Take 3.125 mg by mouth 2 (two) times daily with a meal.   cephALEXin 500 MG capsule Commonly known as:  KEFLEX Take 1 capsule (500 mg total) by mouth every 12 (twelve) hours.   cholecalciferol 1000 units tablet Commonly known as:  VITAMIN D Take 1,000 Units by mouth daily.   divalproex 125 MG capsule Commonly known as:  DEPAKOTE SPRINKLE Take 250 mg by mouth 2 (two) times daily.   docusate 50 MG/5ML liquid Commonly known as:  COLACE Take 100 mg by mouth 2 (two) times daily.   donepezil 5 MG tablet Commonly known as:  ARICEPT Take 5 mg by mouth at bedtime.   ferrous sulfate 325 (65 FE) MG tablet Take 1 tablet (325 mg total) by mouth 3 (three) times daily with meals.   guaifenesin 100 MG/5ML syrup Commonly known as:   ROBITUSSIN Take 200 mg by mouth 4 (four) times daily as needed for cough.   HYDROcodone-acetaminophen 5-325 MG tablet Commonly known as:  NORCO/VICODIN Take 1 tablet by mouth every 6 (six) hours as needed for moderate pain.   latanoprost 0.005 % ophthalmic solution Commonly known as:  XALATAN Place 1 drop into both eyes at bedtime.   lisinopril 10 MG tablet Commonly known as:  PRINIVIL,ZESTRIL Take 10 mg by mouth daily.   loperamide 2 MG capsule Commonly known as:  IMODIUM Take by mouth as needed for diarrhea or loose stools.   LORazepam 0.5 MG tablet Commonly  known as:  ATIVAN Take 1 tablet (0.5 mg total) by mouth every 8 (eight) hours. What changed:  Another medication with the same name was removed. Continue taking this medication, and follow the directions you see here.   magnesium hydroxide 400 MG/5ML suspension Commonly known as:  MILK OF MAGNESIA Take 30 mLs by mouth at bedtime as needed for mild constipation.   mirtazapine 7.5 MG tablet Commonly known as:  REMERON Take 7.5 mg by mouth at bedtime.   neomycin-bacitracin-polymyxin [phone] ointment Apply 1 application topically 4 (four) times daily.   risperiDONE 0.5 MG tablet Commonly known as:  RISPERDAL Take 0.5 mg by mouth at bedtime.     Relevant Imaging Results:  Relevant Lab Results:   Additional Information SSN [id number]  Darleene Cleaver, [place]

## 2017-08-21 NOTE — Discharge Summary (Addendum)
[facility] -  at [facility], [place] y.o., DOB Jan 15, 1924, MRN [id number]. Admission date: 08/19/2017 Discharge Date 08/21/2017 Primary MD Center, [facility] Admitting Physician Ihor Austin, MD  Admission Diagnosis  Lower urinary tract infectious disease [N39.0] Elevated troponin [R74.8] Essential hypertension [I10] Altered mental status, unspecified altered mental status type [R41.82]  Discharge Diagnosis   Active Problems: Acute encephalopathy Urinary tract infection Essential hypertension Elevated troponin due to demand ischemia Osteoporosis Unspecified dementia    Hospital Course  Yesenia Leonard  is a 83 y.o. female with a known history of Alzheimers dementia, hypercalcemia, hypertension, osteoporosis and is a resident of a [facility] was referred to the emergency room for agitation and confusion. Patient was agitated and not sleeping and tearing up in her room. Patient was seen in the emergency room had a CT scan of the head which was negative. She was noted to have a urinary tract infection therefore started on antibiotics. With the treatment with antibiotics her mental status improved. She is currently back to baseline and stable to return back to her facility.            Consults  None  Significant Tests:  See full reports for all details   Ct Head Wo Contrast  Result Date: 08/19/2017 CLINICAL DATA:  Altered level of consciousness. Patient fell earlier tonight. EXAM: CT HEAD WITHOUT CONTRAST TECHNIQUE: Contiguous axial images were obtained from the base of the skull through the vertex without intravenous contrast. COMPARISON:  06/18/2017 FINDINGS: Brain: Diffuse cerebral atrophy. Ventricular dilatation consistent with central atrophy. Low-attenuation changes in the deep white matter consistent with small vessel ischemia. No mass effect or midline shift. No abnormal extra-axial fluid collections. Gray-white matter  junctions are distinct. Basal cisterns are not effaced. No acute intracranial hemorrhage. Vascular: Vascular calcifications are present in the internal carotid arteries. Skull: Calvarium appears intact. Sinuses/Orbits: Paranasal sinuses are clear. Partial opacification of the right mastoid air cells. Other: No significant changes since previous study. IMPRESSION: No acute intracranial abnormalities. Chronic atrophy and small vessel ischemic changes. Right mastoid effusion. Electronically Signed   By: Burman Nieves M.D.   On: 08/19/2017 04:21   Dg Chest Port 1 View  Result Date: 08/19/2017 CLINICAL DATA:  Altered mental status. Patient fell earlier in the night. EXAM: PORTABLE CHEST 1 VIEW COMPARISON:  06/14/2017 FINDINGS: Shallow inspiration. Heart size and pulmonary vascularity are normal. Linear scarring in the left lung base is unchanged since prior study. No airspace disease or consolidation in the lungs. No pneumothorax. No pleural effusions. Calcification of the aorta. Degenerative changes in the spine and shoulders. IMPRESSION: No evidence of active pulmonary disease.  Aortic atherosclerosis. Electronically Signed   By: Burman Nieves M.D.   On: 08/19/2017 03:33       Today   Subjective:   Yesenia Leonard mental status improved  Objective:   Blood pressure 132/73, pulse 92, temperature 98.6 F (37 C), temperature source Oral, resp. rate 18, height  (1.676 m), weight 151 lb (68.5 kg), SpO2 96 %.  .  Intake/Output Summary (Last 24 hours) at 08/21/17 1413 Last data filed at 08/21/17 1023  Gross per 24 hour  Intake              360 ml  Output              400 ml  Net              -40 ml  Exam VITAL SIGNS: Blood pressure 132/73, pulse 92, temperature 98.6 F (37 C), temperature source Oral, resp. rate 18, height  (1.676 m), weight 151 lb (68.5 kg), SpO2 96 %.  GENERAL:  83 y.o.-year-old patient lying in the bed with no acute distress.  EYES: Pupils equal,  round, reactive to light and accommodation. No scleral icterus. Extraocular muscles intact.  HEENT: Head atraumatic, normocephalic. Oropharynx and nasopharynx clear.  NECK:  Supple, no jugular venous distention. No thyroid enlargement, no tenderness.  LUNGS: Normal breath sounds bilaterally, no wheezing, rales,rhonchi or crepitation. No use of accessory muscles of respiration.  CARDIOVASCULAR: S1, S2 normal. No murmurs, rubs, or gallops.  ABDOMEN: Soft, nontender, nondistended. Bowel sounds present. No organomegaly or mass.  EXTREMITIES: No pedal edema, cyanosis, or clubbing.  NEUROLOGIC: Cranial nerves II through XII are intact. Muscle strength 5/5 in all extremities. Sensation intact. Gait not checked.  PSYCHIATRIC: The patient is alert and oriented x 3.  SKIN: No obvious rash, lesion, or ulcer.   Data Review     CBC w Diff:  Lab Results  Component Value Date   WBC 10.6 08/19/2017   HGB 14.3 08/19/2017   HCT 42.0 08/19/2017   PLT 133 (L) 08/19/2017   LYMPHOPCT 7 08/19/2017   MONOPCT 10 08/19/2017   EOSPCT 0 08/19/2017   BASOPCT 1 08/19/2017   CMP:  Lab Results  Component Value Date   NA 142 08/19/2017   K 3.5 08/19/2017   CL 102 08/19/2017   CO2 31 08/19/2017   BUN 17 08/19/2017   CREATININE 0.80 08/19/2017   PROT 7.0 08/19/2017   ALBUMIN 3.6 08/19/2017   BILITOT 0.5 08/19/2017   ALKPHOS 93 08/19/2017   AST 27 08/19/2017   ALT 14 08/19/2017  .  Micro Results Recent Results (from the past 240 hour(s))  Urine culture     Status: Abnormal (Preliminary result)   Collection Time: 08/19/17  3:21 AM  Result Value Ref Range Status   Specimen Description URINE, RANDOM  Final   Special Requests NONE  Final   Culture (A)  Final    >=100,000 COLONIES/mL ESCHERICHIA COLI >=100,000 COLONIES/mL PROTEUS MIRABILIS SUSCEPTIBILITIES TO FOLLOW Performed at [facility], [address] [place], [place] [address]    Report Status PENDING  Incomplete        Code  Status Orders        Start     Ordered   08/19/17 0543  Full code  Continuous     08/19/17 0542    Code Status History    Date Active Date Inactive Code Status Order ID Comments User Context   12/19/2016  6:59 PM 12/19/2016  6:59 PM Full Code [id number]  Auburn Bilberry, MD Inpatient   12/19/2016  6:59 PM 12/23/2016 10:45 PM Full Code [id number]  Poggi, Excell Seltzer, MD Inpatient          Follow-up Information    Center, [facility] On 08/28/2017.   Specialty:  General Practice Why:  Appointment Time: 3:20p Contact information: [address] [place] [address] [phone]           Discharge Medications   Allergies as of 08/21/2017   No Known Allergies     Medication List    TAKE these medications   acetaminophen 500 MG tablet Commonly known as:  TYLENOL Take 500 mg by mouth every 6 (six) hours as needed.   alendronate 70 MG tablet Commonly known as:  FOSAMAX Take 70 mg  by mouth once a week. Take with a full glass of water on an empty stomach.   alum & mag hydroxide-simeth 400-400-40 MG/5ML suspension Commonly known as:  MAALOX PLUS Take 10 mLs by mouth as needed for indigestion.   aspirin 81 MG chewable tablet Chew 81 mg by mouth daily.   carvedilol 6.25 MG tablet Commonly known as:  COREG Take 3.125 mg by mouth 2 (two) times daily with a meal.   cefdinir 300 MG capsule Commonly known as:  OMNICEF Take 1 capsule (300 mg total) by mouth 2 (two) times daily.   cholecalciferol 1000 units tablet Commonly known as:  VITAMIN D Take 1,000 Units by mouth daily.   divalproex 125 MG capsule Commonly known as:  DEPAKOTE SPRINKLE Take 250 mg by mouth 2 (two) times daily.   docusate 50 MG/5ML liquid Commonly known as:  COLACE Take 100 mg by mouth 2 (two) times daily.   donepezil 5 MG tablet Commonly known as:  ARICEPT Take 5 mg by mouth at bedtime.   ferrous sulfate 325 (65 FE) MG tablet Take 1 tablet (325 mg total) by mouth 3 (three) times  daily with meals.   guaifenesin 100 MG/5ML syrup Commonly known as:  ROBITUSSIN Take 200 mg by mouth 4 (four) times daily as needed for cough.   HYDROcodone-acetaminophen 5-325 MG tablet Commonly known as:  NORCO/VICODIN Take 1 tablet by mouth every 6 (six) hours as needed for moderate pain.   latanoprost 0.005 % ophthalmic solution Commonly known as:  XALATAN Place 1 drop into both eyes at bedtime.   lisinopril 10 MG tablet Commonly known as:  PRINIVIL,ZESTRIL Take 10 mg by mouth daily.   loperamide 2 MG capsule Commonly known as:  IMODIUM Take by mouth as needed for diarrhea or loose stools.   LORazepam 0.5 MG tablet Commonly known as:  ATIVAN Take 1 tablet (0.5 mg total) by mouth every 8 (eight) hours. What changed:  Another medication with the same name was removed. Continue taking this medication, and follow the directions you see here.   magnesium hydroxide 400 MG/5ML suspension Commonly known as:  MILK OF MAGNESIA Take 30 mLs by mouth at bedtime as needed for mild constipation.   mirtazapine 7.5 MG tablet Commonly known as:  REMERON Take 7.5 mg by mouth at bedtime.   neomycin-bacitracin-polymyxin [id number] ointment Apply 1 application topically 4 (four) times daily.   risperiDONE 0.5 MG tablet Commonly known as:  RISPERDAL Take 0.5 mg by mouth at bedtime.          Total Time in preparing paper work, data evaluation and todays exam - 35 minutes  Auburn Bilberry M.D on 08/21/2017 at 2:13 [facility]  [facility]   Office  [phone]

## 2017-08-21 NOTE — Progress Notes (Signed)
Per Minerva Areola, SW, facility needs to come to assess patient tomorrow morning before they will accept her back. Dr. Allena Katz notified. Order to cancel discharge.

## 2017-08-21 NOTE — Discharge Instructions (Signed)
[facility] - [place] at [facility]  DIET:  Cardiac diet  DISCHARGE CONDITION:  Stable  ACTIVITY:  Activity as tolerated  OXYGEN:  Home Oxygen: No.   Oxygen Delivery: room air  DISCHARGE LOCATION:  [facility]   ADDITIONAL DISCHARGE INSTRUCTION:   If you experience worsening of your admission symptoms, develop shortness of breath, life threatening emergency, suicidal or homicidal thoughts you must seek medical attention immediately by calling 911 or calling your MD immediately  if symptoms less severe.  You Must read complete instructions/literature along with all the possible adverse reactions/side effects for all the Medicines you take and that have been prescribed to you. Take any new Medicines after you have completely understood and accpet all the possible adverse reactions/side effects.   Please note  You were cared for by a hospitalist during your hospital stay. If you have any questions about your discharge medications or the care you received while you were in the hospital after you are discharged, you can call the unit and asked to speak with the hospitalist on call if the hospitalist that took care of you is not available. Once you are discharged, your primary care physician will handle any further medical issues. Please note that NO REFILLS for any discharge medications will be authorized once you are discharged, as it is imperative that you return to your primary care physician (or establish a relationship with a primary care physician if you do not have one) for your aftercare needs so that they can reassess your need for medications and monitor your lab values.

## 2017-08-21 NOTE — Progress Notes (Signed)
EMS came to take patient but she refused and became agitated. Unable to get in touch with legal guardian or family. Spoke to Dr. Allena Katz - order for haldol  IM once.

## 2017-08-22 LAB — URINE CULTURE

## 2017-08-22 MED ORDER — CEFDINIR 300 MG PO CAPS
300.0000 mg | ORAL_CAPSULE | Freq: Two times a day (BID) | ORAL | Status: DC
  Administered 2017-08-22: 300 mg via ORAL
  Filled 2017-08-22: qty 1

## 2017-08-22 MED ORDER — LORAZEPAM 1 MG PO TABS
1.0000 mg | ORAL_TABLET | Freq: Four times a day (QID) | ORAL | Status: DC | PRN
  Filled 2017-08-22: qty 1

## 2017-08-22 NOTE — Discharge Summary (Signed)
[facility] - [place] at [facility], [place] y.o., DOB [birth year], MRN [id number]. Admission date: 08/19/2017 Discharge Date 08/22/2017 Primary MD Center, [facility] Admitting Physician Ihor Austin, MD  Admission Diagnosis  Lower urinary tract infectious disease [N39.0] Elevated troponin [R74.8] Essential hypertension [I10] Altered mental status, unspecified altered mental status type [R41.82]  Discharge Diagnosis   Active Problems: Acute encephalopathy Urinary tract infection Essential hypertension Elevated troponin due to demand ischemia Osteoporosis Unspecified dementia    Hospital Course  Yesenia Leonard  is a 83 y.o. female with a known history of Alzheimers dementia, hypercalcemia, hypertension, osteoporosis and is a resident of a [place] [facility] was referred to the emergency room for agitation and confusion. Patient was agitated and not sleeping and tearing up in her room. Patient was seen in the emergency room had a CT scan of the head which was negative. She was noted to have a urinary tract infection therefore started on antibiotics. With the treatment with antibiotics her mental status improved. She is currently back to baseline and stable to return back to her facility.             Consults  None  Significant Tests:  See full reports for all details   Ct Head Wo Contrast  Result Date: 08/19/2017 CLINICAL DATA:  Altered level of consciousness. Patient fell earlier tonight. EXAM: CT HEAD WITHOUT CONTRAST TECHNIQUE: Contiguous axial images were obtained from the base of the skull through the vertex without intravenous contrast. COMPARISON:  06/18/2017 FINDINGS: Brain: Diffuse cerebral atrophy. Ventricular dilatation consistent with central atrophy. Low-attenuation changes in the deep white matter consistent with small vessel ischemia. No mass effect or midline shift. No abnormal extra-axial fluid collections. Gray-white matter  junctions are distinct. Basal cisterns are not effaced. No acute intracranial hemorrhage. Vascular: Vascular calcifications are present in the internal carotid arteries. Skull: Calvarium appears intact. Sinuses/Orbits: Paranasal sinuses are clear. Partial opacification of the right mastoid air cells. Other: No significant changes since previous study. IMPRESSION: No acute intracranial abnormalities. Chronic atrophy and small vessel ischemic changes. Right mastoid effusion. Electronically Signed   By: Burman Nieves M.D.   On: 08/19/2017 04:21   Dg Chest Port 1 View  Result Date: 08/19/2017 CLINICAL DATA:  Altered mental status. Patient fell earlier in the night. EXAM: PORTABLE CHEST 1 VIEW COMPARISON:  06/14/2017 FINDINGS: Shallow inspiration. Heart size and pulmonary vascularity are normal. Linear scarring in the left lung base is unchanged since prior study. No airspace disease or consolidation in the lungs. No pneumothorax. No pleural effusions. Calcification of the aorta. Degenerative changes in the spine and shoulders. IMPRESSION: No evidence of active pulmonary disease.  Aortic atherosclerosis. Electronically Signed   By: Burman Nieves M.D.   On: 08/19/2017 03:33       Today   Subjective:   Yesenia Leonard patient's mental status improved  Objective:   Blood pressure 122/63, pulse 80, temperature 98.3 F (36.8 C), temperature source Oral, resp. rate 18, height  (1.676 m), weight 147 lb (66.7 kg), SpO2 96 %.  .  Intake/Output Summary (Last 24 hours) at 08/22/17 0945 Last data filed at 08/22/17 0500  Gross per 24 hour  Intake              600 ml  Output                0 ml  Net              600  ml    Exam VITAL SIGNS: Blood pressure 122/63, pulse 80, temperature 98.3 F (36.8 C), temperature source Oral, resp. rate 18, height  (1.676 m), weight 147 lb (66.7 kg), SpO2 96 %.  GENERAL:  83 y.o.-year-old patient lying in the bed with no acute distress.  EYES: Pupils  equal, round, reactive to light and accommodation. No scleral icterus. Extraocular muscles intact.  HEENT: Head atraumatic, normocephalic. Oropharynx and nasopharynx clear.  NECK:  Supple, no jugular venous distention. No thyroid enlargement, no tenderness.  LUNGS: Normal breath sounds bilaterally, no wheezing, rales,rhonchi or crepitation. No use of accessory muscles of respiration.  CARDIOVASCULAR: S1, S2 normal. No murmurs, rubs, or gallops.  ABDOMEN: Soft, nontender, nondistended. Bowel sounds present. No organomegaly or mass.  EXTREMITIES: No pedal edema, cyanosis, or clubbing.  NEUROLOGIC: Cranial nerves II through XII are intact. Muscle strength 5/5 in all extremities. Sensation intact. Gait not checked.  PSYCHIATRIC: The patient is alert and oriented x 3.  SKIN: No obvious rash, lesion, or ulcer.   Data Review     CBC w Diff:  Lab Results  Component Value Date   WBC 10.6 08/19/2017   HGB 14.3 08/19/2017   HCT 42.0 08/19/2017   PLT 133 (L) 08/19/2017   LYMPHOPCT 7 08/19/2017   MONOPCT 10 08/19/2017   EOSPCT 0 08/19/2017   BASOPCT 1 08/19/2017   CMP:  Lab Results  Component Value Date   NA 142 08/19/2017   K 3.5 08/19/2017   CL 102 08/19/2017   CO2 31 08/19/2017   BUN 17 08/19/2017   CREATININE 0.80 08/19/2017   PROT 7.0 08/19/2017   ALBUMIN 3.6 08/19/2017   BILITOT 0.5 08/19/2017   ALKPHOS 93 08/19/2017   AST 27 08/19/2017   ALT 14 08/19/2017  .  Micro Results Recent Results (from the past 240 hour(s))  Urine culture     Status: Abnormal   Collection Time: 08/19/17  3:21 AM  Result Value Ref Range Status   Specimen Description URINE, RANDOM  Final   Special Requests NONE  Final   Culture (A)  Final    >=100,000 COLONIES/mL ESCHERICHIA COLI >=100,000 COLONIES/mL PROTEUS MIRABILIS    Report Status 08/22/2017 FINAL  Final   Organism ID, Bacteria ESCHERICHIA COLI (A)  Final   Organism ID, Bacteria PROTEUS MIRABILIS (A)  Final      Susceptibility    Escherichia coli - MIC*    AMPICILLIN <=2 SENSITIVE Sensitive     CEFAZOLIN <=4 SENSITIVE Sensitive     CEFTRIAXONE <=1 SENSITIVE Sensitive     CIPROFLOXACIN <=0.25 SENSITIVE Sensitive     GENTAMICIN <=1 SENSITIVE Sensitive     IMIPENEM <=0.25 SENSITIVE Sensitive     NITROFURANTOIN <=16 SENSITIVE Sensitive     TRIMETH/SULFA <=20 SENSITIVE Sensitive     AMPICILLIN/SULBACTAM <=2 SENSITIVE Sensitive     PIP/TAZO <=4 SENSITIVE Sensitive     Extended ESBL NEGATIVE Sensitive     * >=100,000 COLONIES/mL ESCHERICHIA COLI   Proteus mirabilis - MIC*    AMPICILLIN <=2 SENSITIVE Sensitive     CEFAZOLIN <=4 SENSITIVE Sensitive     CEFTRIAXONE <=1 SENSITIVE Sensitive     CIPROFLOXACIN <=0.25 SENSITIVE Sensitive     GENTAMICIN <=1 SENSITIVE Sensitive     IMIPENEM 2 SENSITIVE Sensitive     NITROFURANTOIN 128 RESISTANT Resistant     TRIMETH/SULFA <=20 SENSITIVE Sensitive     AMPICILLIN/SULBACTAM <=2 SENSITIVE Sensitive     PIP/TAZO <=4 SENSITIVE Sensitive     * >=100,000  COLONIES/mL PROTEUS MIRABILIS        Code Status Orders        Start     Ordered   08/19/17 0543  Full code  Continuous     08/19/17 0542    Code Status History    Date Active Date Inactive Code Status Order ID Comments User Context   12/19/2016  6:59 PM 12/19/2016  6:59 PM Full Code [id number]  Auburn Bilberry, MD Inpatient   12/19/2016  6:59 PM 12/23/2016 10:45 PM Full Code [id number]  Poggi, Excell Seltzer, MD Inpatient          Follow-up Information    Center, [facility] On 09/08/2017.   Specialty:  General Practice Why:  Appointment Time: 3:20p Contact information: [address] [place] [address] [phone]           Discharge Medications   Allergies as of 08/22/2017   No Known Allergies     Medication List    TAKE these medications   acetaminophen 500 MG tablet Commonly known as:  TYLENOL Take 500 mg by mouth every 6 (six) hours as needed.   alendronate 70 MG tablet Commonly  known as:  FOSAMAX Take 70 mg by mouth once a week. Take with a full glass of water on an empty stomach.   alum & mag hydroxide-simeth 400-400-40 MG/5ML suspension Commonly known as:  MAALOX PLUS Take 10 mLs by mouth as needed for indigestion.   aspirin 81 MG chewable tablet Chew 81 mg by mouth daily.   carvedilol 6.25 MG tablet Commonly known as:  COREG Take 3.125 mg by mouth 2 (two) times daily with a meal.   cefdinir 300 MG capsule Commonly known as:  OMNICEF Take 1 capsule (300 mg total) by mouth 2 (two) times daily.   cholecalciferol 1000 units tablet Commonly known as:  VITAMIN D Take 1,000 Units by mouth daily.   divalproex 125 MG capsule Commonly known as:  DEPAKOTE SPRINKLE Take 250 mg by mouth 2 (two) times daily.   docusate 50 MG/5ML liquid Commonly known as:  COLACE Take 100 mg by mouth 2 (two) times daily.   donepezil 5 MG tablet Commonly known as:  ARICEPT Take 5 mg by mouth at bedtime.   ferrous sulfate 325 (65 FE) MG tablet Take 1 tablet (325 mg total) by mouth 3 (three) times daily with meals.   guaifenesin 100 MG/5ML syrup Commonly known as:  ROBITUSSIN Take 200 mg by mouth 4 (four) times daily as needed for cough.   HYDROcodone-acetaminophen 5-325 MG tablet Commonly known as:  NORCO/VICODIN Take 1 tablet by mouth every 6 (six) hours as needed for moderate pain.   latanoprost 0.005 % ophthalmic solution Commonly known as:  XALATAN Place 1 drop into both eyes at bedtime.   lisinopril 10 MG tablet Commonly known as:  PRINIVIL,ZESTRIL Take 10 mg by mouth daily.   loperamide 2 MG capsule Commonly known as:  IMODIUM Take by mouth as needed for diarrhea or loose stools.   LORazepam 0.5 MG tablet Commonly known as:  ATIVAN Take 1 tablet (0.5 mg total) by mouth every 8 (eight) hours. What changed:  Another medication with the same name was removed. Continue taking this medication, and follow the directions you see here.   magnesium hydroxide 400  MG/5ML suspension Commonly known as:  MILK OF MAGNESIA Take 30 mLs by mouth at bedtime as needed for mild constipation.   mirtazapine 7.5 MG tablet Commonly known as:  REMERON Take  7.5 mg by mouth at bedtime.   neomycin-bacitracin-polymyxin [phone] ointment Apply 1 application topically 4 (four) times daily.   risperiDONE 0.5 MG tablet Commonly known as:  RISPERDAL Take 0.5 mg by mouth at bedtime.          Total Time in preparing paper work, data evaluation and todays exam - 35 minutes  Auburn Bilberry M.D on 08/22/2017 at 9:45 AM  [facility]   Office  [phone]

## 2017-08-22 NOTE — Progress Notes (Signed)
Report called to [facility] and spoke to [place], [place].

## 2017-08-22 NOTE — Clinical Social Work Note (Addendum)
CSW attempted to contact Yesenia Leonard at [facility] [phone] to find out if she has completed assessment yet.  CSW left message awaiting for call back.  CSW contacted patient's legal guardian Yesenia Leonard at [phone] and she is going to contact [employer] as well and then call CSW back.  CSW received phone call back from Patient's legal guardian, patient will be discharging back to [facility], facility will be here to pick patient up.  Patient to be d/c'ed today to [facility].  Patient and family agreeable to plans will transport via facility transportation RN to call report.  Yesenia Leonard. Yesenia Leonard, MSW, Yesenia Leonard [phone]  08/22/2017 10:33 AM

## 2017-08-22 NOTE — Progress Notes (Signed)
[facility] - [place] at [facility]   PATIENT NAME: Yesenia Leonard    MR#:  [id number]  DATE OF BIRTH:  [birth year]  SUBJECTIVE:  CHIEF COMPLAINT:   Chief Complaint  Patient presents with  . Altered Mental Status  plan was patient to be discharged earlier when ems came pt didn't want to go so discharge held  REVIEW OF SYSTEMS:   Pt have confusion, Has dementia ROS  DRUG ALLERGIES:  No Known Allergies  VITALS:  Blood pressure 122/63, pulse 80, temperature 98.3 F (36.8 C), temperature source Oral, resp. rate 18, height  (1.676 m), weight 147 lb (66.7 kg), SpO2 96 %.  PHYSICAL EXAMINATION:  GENERAL:  83 y.o.-year-old patient lying in the bed with no acute distress.  EYES: Pupils equal, round, reactive to light and accommodation. No scleral icterus. Extraocular muscles intact.  HEENT: Head atraumatic, normocephalic. Oropharynx and nasopharynx clear.  NECK:  Supple, no jugular venous distention. No thyroid enlargement, no tenderness.  LUNGS: Normal breath sounds bilaterally, no wheezing, rales,rhonchi or crepitation. No use of accessory muscles of respiration.  CARDIOVASCULAR: S1, S2 normal. No murmurs, rubs, or gallops.  ABDOMEN: Soft, nontender, nondistended. Bowel sounds present. No organomegaly or mass.  EXTREMITIES: No pedal edema, cyanosis, or clubbing.  NEUROLOGIC: Cranial nerves II through XII are intact. Muscle strength 3-4/5 in all extremities. Sensation intact. Gait not checked.  PSYCHIATRIC: The patient is alert and oriented x 1.  SKIN: No obvious rash, lesion, or ulcer.   Physical Exam LABORATORY PANEL:   CBC  Recent Labs Lab 08/19/17 0321  WBC 10.6  HGB 14.3  HCT 42.0  PLT 133*   ------------------------------------------------------------------------------------------------------------------  Chemistries   Recent Labs Lab 08/19/17 0321  NA 142  K 3.5  CL 102  CO2 31  GLUCOSE 182*  BUN 17  CREATININE 0.80  CALCIUM 10.5*   AST 27  ALT 14  ALKPHOS 93  BILITOT 0.5   ------------------------------------------------------------------------------------------------------------------  Cardiac Enzymes  Recent Labs Lab 08/19/17 1519 08/19/17 2138  TROPONINI 0.05* 0.04*   ------------------------------------------------------------------------------------------------------------------  RADIOLOGY:  No results found.  ASSESSMENT AND PLAN:   Active Problems:   Altered mental status   Altered mental state  1. Urinary tract infection  ecoli and proteus, oral antibiotics 2.  acute encephalopathy due to UTI now improving 3. Hypertension- cont coreg, lisinopril. 4. Abnormal troponin- due to demand ischemia.    All the records are reviewed and case discussed with Care Management/Social Workerr. Management plans discussed with the patient, family and they are in agreement.  CODE STATUS: Full.  TOTAL TIME TAKING CARE OF THIS PATIENT: 35 minutes.     POSSIBLE D/C IN 1-2 DAYS, DEPENDING ON CLINICAL CONDITION.   Auburn Bilberry M.D on 08/22/2017   Between 7am to 6pm - Pager - [phone]  After 6pm go to [email or web address] - Social research officer, government  [facility]  Office  [phone]  CC: Primary care physician; Center, [facility]  Note: This dictation was prepared with Dragon dictation along with smaller phrase technology. Any transcriptional errors that result from this process are unintentional.

## 2017-08-29 ENCOUNTER — Other Ambulatory Visit: Payer: Self-pay

## 2017-08-29 ENCOUNTER — Emergency Department
Admission: EM | Admit: 2017-08-29 | Discharge: 2017-08-29 | Discharge status: Absent without leave | Payer: Medicare Other | Attending: Emergency Medicine | Admitting: Emergency Medicine

## 2017-08-29 ENCOUNTER — Inpatient Hospital Stay: Payer: Medicare Other

## 2017-08-29 ENCOUNTER — Emergency Department: Payer: Medicare Other

## 2017-08-29 ENCOUNTER — Encounter: Payer: Self-pay | Admitting: Emergency Medicine

## 2017-08-29 ENCOUNTER — Inpatient Hospital Stay
Admission: EM | Admit: 2017-08-29 | Discharge: 2017-09-18 | DRG: 064 | Disposition: E | Payer: Medicare Other | Attending: Internal Medicine | Admitting: Internal Medicine

## 2017-08-29 DIAGNOSIS — Z8249 Family history of ischemic heart disease and other diseases of the circulatory system: Secondary | ICD-10-CM

## 2017-08-29 DIAGNOSIS — R001 Bradycardia, unspecified: Secondary | ICD-10-CM | POA: Diagnosis present

## 2017-08-29 DIAGNOSIS — R402212 Coma scale, best verbal response, none, at arrival to emergency department: Secondary | ICD-10-CM | POA: Diagnosis present

## 2017-08-29 DIAGNOSIS — Z7189 Other specified counseling: Secondary | ICD-10-CM

## 2017-08-29 DIAGNOSIS — F0280 Dementia in other diseases classified elsewhere without behavioral disturbance: Secondary | ICD-10-CM | POA: Diagnosis present

## 2017-08-29 DIAGNOSIS — E872 Acidosis: Secondary | ICD-10-CM | POA: Diagnosis present

## 2017-08-29 DIAGNOSIS — G9341 Metabolic encephalopathy: Secondary | ICD-10-CM | POA: Diagnosis present

## 2017-08-29 DIAGNOSIS — E876 Hypokalemia: Secondary | ICD-10-CM | POA: Diagnosis present

## 2017-08-29 DIAGNOSIS — I248 Other forms of acute ischemic heart disease: Secondary | ICD-10-CM | POA: Diagnosis present

## 2017-08-29 DIAGNOSIS — M810 Age-related osteoporosis without current pathological fracture: Secondary | ICD-10-CM | POA: Diagnosis present

## 2017-08-29 DIAGNOSIS — N179 Acute kidney failure, unspecified: Secondary | ICD-10-CM | POA: Diagnosis not present

## 2017-08-29 DIAGNOSIS — J9600 Acute respiratory failure, unspecified whether with hypoxia or hypercapnia: Secondary | ICD-10-CM | POA: Diagnosis present

## 2017-08-29 DIAGNOSIS — R778 Other specified abnormalities of plasma proteins: Secondary | ICD-10-CM

## 2017-08-29 DIAGNOSIS — R402312 Coma scale, best motor response, none, at arrival to emergency department: Secondary | ICD-10-CM | POA: Diagnosis present

## 2017-08-29 DIAGNOSIS — F0390 Unspecified dementia without behavioral disturbance: Secondary | ICD-10-CM | POA: Diagnosis present

## 2017-08-29 DIAGNOSIS — Z7982 Long term (current) use of aspirin: Secondary | ICD-10-CM

## 2017-08-29 DIAGNOSIS — R7989 Other specified abnormal findings of blood chemistry: Secondary | ICD-10-CM

## 2017-08-29 DIAGNOSIS — J9601 Acute respiratory failure with hypoxia: Secondary | ICD-10-CM | POA: Diagnosis present

## 2017-08-29 DIAGNOSIS — I498 Other specified cardiac arrhythmias: Secondary | ICD-10-CM | POA: Diagnosis not present

## 2017-08-29 DIAGNOSIS — R402112 Coma scale, eyes open, never, at arrival to emergency department: Secondary | ICD-10-CM | POA: Diagnosis present

## 2017-08-29 DIAGNOSIS — I340 Nonrheumatic mitral (valve) insufficiency: Secondary | ICD-10-CM | POA: Diagnosis not present

## 2017-08-29 DIAGNOSIS — I639 Cerebral infarction, unspecified: Secondary | ICD-10-CM | POA: Diagnosis present

## 2017-08-29 DIAGNOSIS — Z66 Do not resuscitate: Secondary | ICD-10-CM | POA: Diagnosis not present

## 2017-08-29 DIAGNOSIS — J690 Pneumonitis due to inhalation of food and vomit: Secondary | ICD-10-CM | POA: Diagnosis present

## 2017-08-29 DIAGNOSIS — D696 Thrombocytopenia, unspecified: Secondary | ICD-10-CM | POA: Diagnosis present

## 2017-08-29 DIAGNOSIS — R739 Hyperglycemia, unspecified: Secondary | ICD-10-CM | POA: Diagnosis present

## 2017-08-29 DIAGNOSIS — T17908A Unspecified foreign body in respiratory tract, part unspecified causing other injury, initial encounter: Secondary | ICD-10-CM

## 2017-08-29 DIAGNOSIS — Z515 Encounter for palliative care: Secondary | ICD-10-CM | POA: Diagnosis not present

## 2017-08-29 DIAGNOSIS — Z7983 Long term (current) use of bisphosphonates: Secondary | ICD-10-CM | POA: Diagnosis not present

## 2017-08-29 DIAGNOSIS — I10 Essential (primary) hypertension: Secondary | ICD-10-CM | POA: Diagnosis present

## 2017-08-29 DIAGNOSIS — R748 Abnormal levels of other serum enzymes: Secondary | ICD-10-CM | POA: Diagnosis present

## 2017-08-29 DIAGNOSIS — Z931 Gastrostomy status: Secondary | ICD-10-CM

## 2017-08-29 DIAGNOSIS — R7303 Prediabetes: Secondary | ICD-10-CM | POA: Diagnosis present

## 2017-08-29 DIAGNOSIS — G309 Alzheimer's disease, unspecified: Secondary | ICD-10-CM | POA: Diagnosis present

## 2017-08-29 DIAGNOSIS — R4182 Altered mental status, unspecified: Secondary | ICD-10-CM

## 2017-08-29 HISTORY — DX: Orthostatic hypotension: I95.1

## 2017-08-29 HISTORY — DX: Anemia, unspecified: D64.9

## 2017-08-29 HISTORY — DX: Personal history of (healed) traumatic fracture: Z87.81

## 2017-08-29 LAB — COMPREHENSIVE METABOLIC PANEL
ALT: 15 U/L (ref 14–54)
AST: 28 U/L (ref 15–41)
Albumin: 3.3 g/dL — ABNORMAL LOW (ref 3.5–5.0)
Alkaline Phosphatase: 91 U/L (ref 38–126)
Anion gap: 11 (ref 5–15)
BILIRUBIN TOTAL: 0.9 mg/dL (ref 0.3–1.2)
BUN: 30 mg/dL — AB (ref 6–20)
CO2: 28 mmol/L (ref 22–32)
CREATININE: 1.3 mg/dL — AB (ref 0.44–1.00)
Calcium: 11.2 mg/dL — ABNORMAL HIGH (ref 8.9–10.3)
Chloride: 102 mmol/L (ref 101–111)
GFR calc Af Amer: 40 mL/min — ABNORMAL LOW (ref >60)
GFR, EST NON AFRICAN AMERICAN: 35 mL/min — AB (ref >60)
Glucose, Bld: 242 mg/dL — ABNORMAL HIGH (ref 65–99)
POTASSIUM: 4 mmol/L (ref 3.5–5.1)
Sodium: 141 mmol/L (ref 135–145)
TOTAL PROTEIN: 7.1 g/dL (ref 6.5–8.1)

## 2017-08-29 LAB — LACTIC ACID, PLASMA
Lactic Acid, Venous: 3.1 mmol/L (ref 0.5–1.9)
Lactic Acid, Venous: 3.6 mmol/L (ref 0.5–1.9)

## 2017-08-29 LAB — TSH: TSH: 1.747 u[IU]/mL (ref 0.350–4.500)

## 2017-08-29 LAB — BLOOD GAS, ARTERIAL
ACID-BASE DEFICIT: 1.3 mmol/L (ref 0.0–2.0)
Bicarbonate: 23.7 mmol/L (ref 20.0–28.0)
FIO2: 1
O2 SAT: 100 %
PCO2 ART: 40 mmHg (ref 32.0–48.0)
PEEP: 5 cmH2O
PH ART: 7.38 (ref 7.350–7.450)
PO2 ART: 456 mmHg — AB (ref 83.0–108.0)
Patient temperature: 37
RATE: 16 resp/min
VT: 450 mL

## 2017-08-29 LAB — GLUCOSE, CAPILLARY: GLUCOSE-CAPILLARY: 154 mg/dL — AB (ref 65–99)

## 2017-08-29 LAB — CBC WITH DIFFERENTIAL/PLATELET
Basophils Absolute: 0.1 10*3/uL (ref 0–0.1)
Basophils Relative: 1 %
EOS ABS: 0.1 10*3/uL (ref 0–0.7)
EOS PCT: 1 %
HCT: 43.9 % (ref 35.0–47.0)
Hemoglobin: 14.2 g/dL (ref 12.0–16.0)
LYMPHS ABS: 1.8 10*3/uL (ref 1.0–3.6)
Lymphocytes Relative: 12 %
MCH: 29.9 pg (ref 26.0–34.0)
MCHC: 32.4 g/dL (ref 32.0–36.0)
MCV: 92.4 fL (ref 80.0–100.0)
MONO ABS: 0.8 10*3/uL (ref 0.2–0.9)
MONOS PCT: 6 %
Neutro Abs: 11.6 10*3/uL — ABNORMAL HIGH (ref 1.4–6.5)
Neutrophils Relative %: 80 %
PLATELETS: 217 10*3/uL (ref 150–440)
RBC: 4.76 MIL/uL (ref 3.80–5.20)
RDW: 14.2 % (ref 11.5–14.5)
WBC: 14.4 10*3/uL — ABNORMAL HIGH (ref 3.6–11.0)

## 2017-08-29 LAB — LIPID PANEL
CHOL/HDL RATIO: 4.2 ratio
Cholesterol: 171 mg/dL (ref 0–200)
HDL: 41 mg/dL (ref >40)
LDL Cholesterol: 109 mg/dL — ABNORMAL HIGH (ref 0–99)
Triglycerides: 103 mg/dL (ref <150)
VLDL: 21 mg/dL (ref 0–40)

## 2017-08-29 LAB — TROPONIN I
TROPONIN I: 0.08 ng/mL — AB (ref <0.03)
TROPONIN I: 0.04 ng/mL — AB (ref <0.03)

## 2017-08-29 LAB — MRSA PCR SCREENING: MRSA by PCR: NEGATIVE

## 2017-08-29 LAB — PROTIME-INR
INR: 1.09
PROTHROMBIN TIME: 14 s (ref 11.4–15.2)

## 2017-08-29 LAB — PROCALCITONIN: PROCALCITONIN: 0.42 ng/mL

## 2017-08-29 MED ORDER — PIPERACILLIN-TAZOBACTAM 3.375 G IVPB 30 MIN
3.3750 g | Freq: Once | INTRAVENOUS | Status: AC
  Administered 2017-08-29: 3.375 g via INTRAVENOUS
  Filled 2017-08-29: qty 50

## 2017-08-29 MED ORDER — VANCOMYCIN HCL IN DEXTROSE 1-5 GM/200ML-% IV SOLN
1000.0000 mg | Freq: Once | INTRAVENOUS | Status: AC
  Administered 2017-08-29: 1000 mg via INTRAVENOUS
  Filled 2017-08-29: qty 200

## 2017-08-29 MED ORDER — ENOXAPARIN SODIUM 40 MG/0.4ML ~~LOC~~ SOLN: 40.0000 mg | SUBCUTANEOUS | Status: DC

## 2017-08-29 MED ORDER — FENTANYL CITRATE (PF) 100 MCG/2ML IJ SOLN
50.0000 ug | Freq: Once | INTRAMUSCULAR | Status: AC
  Administered 2017-08-29: 50 ug via INTRAVENOUS

## 2017-08-29 MED ORDER — ENOXAPARIN SODIUM 30 MG/0.3ML ~~LOC~~ SOLN: 30.0000 mg | SUBCUTANEOUS | Status: DC

## 2017-08-29 MED ORDER — HEPARIN (PORCINE) IN NACL 100-0.45 UNIT/ML-% IJ SOLN
12.0000 [IU]/kg/h | INTRAMUSCULAR | Status: DC
  Administered 2017-08-29: 12 [IU]/kg/h via INTRAVENOUS
  Filled 2017-08-29: qty 250

## 2017-08-29 MED ORDER — FENTANYL BOLUS VIA INFUSION
25.0000 ug | INTRAVENOUS | Status: DC | PRN
  Administered 2017-08-29: 25 ug via INTRAVENOUS
  Filled 2017-08-29: qty 25

## 2017-08-29 MED ORDER — SODIUM CHLORIDE 0.9 % IV SOLN
0.5000 mg/h | INTRAVENOUS | Status: DC
  Administered 2017-08-29: 0.5 mg/h via INTRAVENOUS
  Administered 2017-08-30: 1.5 mg/h via INTRAVENOUS
  Administered 2017-09-01: 0.5 mg/h via INTRAVENOUS
  Filled 2017-08-29 (×3): qty 10

## 2017-08-29 MED ORDER — DIVALPROEX SODIUM 125 MG PO CSDR
250.0000 mg | DELAYED_RELEASE_CAPSULE | Freq: Two times a day (BID) | ORAL | Status: DC
  Filled 2017-08-29 (×2): qty 2

## 2017-08-29 MED ORDER — SODIUM CHLORIDE 0.9 % IV SOLN
INTRAVENOUS | Status: DC
  Administered 2017-08-29: 100 mL/h via INTRAVENOUS
  Administered 2017-08-30 – 2017-09-01 (×6): via INTRAVENOUS

## 2017-08-29 MED ORDER — SODIUM CHLORIDE 0.9 % IV SOLN: 10.0000 ug/h | INTRAVENOUS | Status: DC

## 2017-08-29 MED ORDER — VANCOMYCIN HCL IN DEXTROSE 750-5 MG/150ML-% IV SOLN
750.0000 mg | INTRAVENOUS | Status: DC
  Filled 2017-08-29: qty 150

## 2017-08-29 MED ORDER — ONDANSETRON HCL 4 MG/2ML IJ SOLN: 4.0000 mg | Freq: Four times a day (QID) | INTRAMUSCULAR | Status: DC | PRN

## 2017-08-29 MED ORDER — CHLORHEXIDINE GLUCONATE 0.12% ORAL RINSE (MEDLINE KIT)
15.0000 mL | Freq: Two times a day (BID) | OROMUCOSAL | Status: DC
  Administered 2017-08-29 – 2017-09-01 (×7): 15 mL via OROMUCOSAL

## 2017-08-29 MED ORDER — FENTANYL CITRATE (PF) 100 MCG/2ML IJ SOLN
INTRAMUSCULAR | Status: AC
  Filled 2017-08-29: qty 2

## 2017-08-29 MED ORDER — FENTANYL 2500MCG IN NS 250ML (10MCG/ML) PREMIX INFUSION
10.0000 ug/h | INTRAVENOUS | Status: DC
  Administered 2017-08-29: 10 ug/h via INTRAVENOUS
  Filled 2017-08-29: qty 250

## 2017-08-29 MED ORDER — FENTANYL 2500MCG IN NS 250ML (10MCG/ML) PREMIX INFUSION
200.0000 ug/h | INTRAVENOUS | Status: DC
  Administered 2017-08-29: 150 ug/h via INTRAVENOUS
  Administered 2017-08-30: 200 ug/h via INTRAVENOUS
  Administered 2017-08-30: 20 ug/h via INTRAVENOUS
  Administered 2017-08-31: 100 ug/h via INTRAVENOUS
  Administered 2017-08-31: 200 ug/h via INTRAVENOUS
  Filled 2017-08-29 (×5): qty 250

## 2017-08-29 MED ORDER — ACETAMINOPHEN 325 MG PO TABS
650.0000 mg | ORAL_TABLET | Freq: Four times a day (QID) | ORAL | Status: DC | PRN
Start: 2017-08-29 — End: 2017-09-01
  Administered 2017-09-01: 650 mg via ORAL
  Filled 2017-08-29: qty 2

## 2017-08-29 MED ORDER — PIPERACILLIN-TAZOBACTAM 3.375 G IVPB
3.3750 g | Freq: Three times a day (TID) | INTRAVENOUS | Status: DC
  Administered 2017-08-29 – 2017-09-01 (×8): 3.375 g via INTRAVENOUS
  Filled 2017-08-29 (×9): qty 50

## 2017-08-29 MED ORDER — ACETAMINOPHEN 650 MG RE SUPP
650.0000 mg | Freq: Four times a day (QID) | RECTAL | Status: DC | PRN
Start: 2017-08-29 — End: 2017-09-02

## 2017-08-29 MED ORDER — ORAL CARE MOUTH RINSE
15.0000 mL | OROMUCOSAL | Status: DC
  Administered 2017-08-29 – 2017-09-01 (×26): 15 mL via OROMUCOSAL

## 2017-08-29 MED ORDER — ONDANSETRON HCL 4 MG PO TABS: 4.0000 mg | ORAL_TABLET | Freq: Four times a day (QID) | ORAL | Status: DC | PRN

## 2017-08-29 NOTE — ED Provider Notes (Signed)
[facility] _________________________________________  I have reviewege vital signs and the triage nursing note.  HISTORY  Chief Complaint Respiratory Arrest   Historian Level 5 Caveat History Limited by unresponsive/critical illness History obtained from EMS at the nursing home  HPI Yesenia Leonard is a 83 y.o. female brought in by EMS after being called for unresponsive and finding patient with inadequate breathing slumped in her wheelchair at the medial table with evidence of fair amount of emesis and likely aspiration.  Patient had poor pulses, and a ate that was bradycardic. She was intubated at the scene. She was started on cardiacing and brought to the emergency department.    Past Medical History:  Diagnosis Date  . Dementia   . Hypercalcemia   . Hypertension   . Hypokalemia   . Osteoporosis   . PPD positive   . Pre-diabetes     Patient Active Problem List   Diagnosis Date Noted  . Altered mental state 08/21/2017  . Altered mental status 08/19/2017  . S/P ORIF (open reduction internal fixation) fracture 12/22/2016  . Hypotension 12/22/2016  . Acute posthemorrhagic anemia 12/22/2016  . H/O transfusion of packed red blood cells 12/22/2016  . Dizziness 12/22/2016  . Orthostatic hypotension 12/22/2016  . Thrombocytopenia (HCC) 12/22/2016  . Hip fracture (HCC) 12/19/2016  . Alzheimer's dementia 12/04/2016    Past Surgical History:  Procedure Laterality Date  . APPENDECTOMY    . INTRAMEDULLARY (IM) NAIL INTERTROCHANTERIC Right 12/19/2016   Procedure: INTRAMEDULLARY (IM) NAIL INTERTROCHANTRIC;  Surgeon: Christena Flake, MD;  Location: [facility] ORS;  Service: Orthopedics;  Laterality: Right;    Prior to Admission medications   Medication Sig Start Date End Date Taking? Authorizing Provider  acetaminophen (TYLENOL) 500 MG tablet Take 500 mg by mouth every 6 (six) hours as needed.    [provider]  alendronate (FOSAMAX) 70 MG  tablet Take 70 mg by mouth once a week. Take with a full glass of water on an empty stomach.    [provider]  alum & mag hydroxide-simeth (MAALOX PLUS) 400-400-40 MG/5ML suspension Take 10 mLs by mouth as needed for indigestion.    [provider]  aspirin 81 MG chewable tablet Chew 81 mg by mouth daily.    [provider]  carvedilol (COREG) 6.25 MG tablet Take 3.125 mg by mouth 2 (two) times daily with a meal.    [provider]  cholecalciferol (VITAMIN D) 1000 units tablet Take 1,000 Units by mouth daily.    [provider]  divalproex (DEPAKOTE SPRINKLE) 125 MG capsule Take 250 mg by mouth 2 (two) times daily.    [provider]  docusate (COLACE) 50 MG/5ML liquid Take 100 mg by mouth 2 (two) times daily.    [provider]  donepezil (ARICEPT) 5 MG tablet Take 5 mg by mouth at bedtime.    [provider]  ferrous sulfate 325 (65 FE) MG tablet Take 1 tablet (325 mg total) by mouth 3 (three) times daily with meals. 12/22/16   [name], MD  guaifenesin (ROBITUSSIN) 100 MG/5ML syrup Take 200 mg by mouth 4 (four) times daily as needed for cough.    [provider]  HYDROcodone-acetaminophen (NORCO/VICODIN) 5-325 MG tablet Take 1 tablet by mouth every 6 (six) hours as needed for moderate pain. 08/21/17   Auburn Bilberry, MD  latanoprost (XALATAN) 0.005 % ophthalmic solution Place 1 drop into both eyes at bedtime. 11/28/16   [provider]  lisinopril (PRINIVIL,ZESTRIL)  10 MG tablet Take 10 mg by mouth daily.    [provider]  loperamide (IMODIUM) 2 MG capsule Take by mouth as needed for diarrhea or loose stools.    [provider]  LORazepam (ATIVAN) 0.5 MG tablet Take 1 tablet (0.5 mg total) by mouth every 8 (eight) hours. 08/21/17   Auburn Bilberry, MD  magnesium hydroxide (MILK OF MAGNESIA) 400 MG/5ML suspension Take 30 mLs by mouth at bedtime as needed for mild constipation.     [provider]  mirtazapine (REMERON) 7.5 MG tablet Take 7.5 mg by mouth at bedtime.    [provider]  neomycin-bacitracin-polymyxin (NEOSPORIN) [phone] ointment Apply 1 application topically 4 (four) times daily.    [provider]  risperiDONE (RISPERDAL) 0.5 MG tablet Take 0.5 mg by mouth at bedtime.    [provider]    No Known Allergies  Family History  Problem Relation Age of Onset  . Hypertension Mother     Social History Social History  Substance Use Topics  . Smoking status: Never Smoker  . Smokeless tobacco: Never Used  . Alcohol use No    Review of Systems unable to obtain due to altered mental status and critical illness ____________________________________________   PHYSICAL EXAM:  VITAL SIGNS: ED Triage Vitals  Enc Vitals Group     BP      Pulse      Resp      Temp      Temp src      SpO2      Weight      Height      Head Circumference      Peak Flow      Pain Score      Pain Loc      Pain Edu?      Excl. in GC?      Constitutional: intubated and being ventilated with a bag. HEENT   Head: Normocephalic and atraumatic.      Eyes: Conjunctivae are normal. Pupils equal and round, small, 2 mm to 1 mm.      Ears:         Nose: No congestion/rhinnorhea.   Mouth/Throat: Mucous membranes are moist.emesis on her face and chest.   Neck: No stridor. Cardiovascular/Chest: paced rhythm around 70, regular rhythm.  No murmurs, rubs, or gallops. Respiratory: equal breath sounds bilaterally with significant rhonchi especially at the bases. Bag ventilated. Gastrointestinal: Soft. No distention, no guarding, no rebound. Nontender.   Genitourinary/rectal:Deferred.  lost stool continence. Musculoskeletal: patient does have some minimal movement of 4 extremities. Neurologic:  no obvious facial droop. Patient unresponsive to voice, appears to have some withdrawal to pain. Skin:  Skin is warm, dry and intact. No  rash noted.    ____________________________________________  LABS (pertinent positives/negatives) I, Governor Rooks, MD the attending physician have reviewed the labs noted below.  Labs Reviewed  COMPREHENSIVE METABOLIC PANEL - Abnormal; Notable for the following:       Result Value   Glucose, Bld 242 (*)    BUN 30 (*)    Creatinine, Ser 1.30 (*)    Calcium 11.2 (*)    Albumin 3.3 (*)    GFR calc non Af Amer 35 (*)    GFR calc Af Amer 40 (*)    All other components within normal limits  TROPONIN I - Abnormal; Notable for the following:    Troponin I 0.08 (*)    All other components within normal limits  LACTIC  ACID, PLASMA - Abnormal; Notable for the following:    Lactic Acid, Venous 3.1 (*)    All other components within normal limits  CBC WITH DIFFERENTIAL/PLATELET - Abnormal; Notable for the following:    WBC 14.4 (*)    Neutro Abs 11.6 (*)    All other components within normal limits  BLOOD GAS, ARTERIAL - Abnormal; Notable for the following:    pO2, Arterial 456 (*)    All other components within normal limits  PROTIME-INR  LACTIC ACID, PLASMA    ____________________________________________    EKG I, Governor Rooks, MD, the attending physician have personally viewed and interpreted all ECGs.  65 beats per minute. Normal sinus rhythm. Narrow QRS. Normal axis. Nonspecific ST and T-wave. ____________________________________________  RADIOLOGY All Xrays were viewed by me.  Imaging interpreted by Radiologist, and I, Governor Rooks, MD the attending physician have reviewed the radiologist interpretation noted below.  chest x-ray portable: IMPRESSION: Interval placement of endotracheal tube which terminates at the clavicular heads, approximately 9.6 cm above the carina.  Defibrillator pads on the chest wall.  CT head without contrast: Pending __________________________________________  PROCEDURES  Procedure(s) performed: None  Critical Care performed:  CRITICAL CARE Performed by: Governor Rooks   Total critical care time: 60 minutes  Critical care time was exclusive of separately billable procedures and treating other patients.  Critical care was necessary to treat or prevent imminent or life-threatening deterioration.  Critical care was time spent personally by me on the following activities: development of treatment plan with patient and/or surrogate as well as nursing, discussions with consultants, evaluation of patient's response to treatment, examination of patient, obtaining history from patient or surrogate, ordering and performing treatments and interventions, ordering and review of laboratory studies, ordering and review of radiographic studies, pulse oximetry and re-evaluation of patient's condition.   ____________________________________________  No current facility-administered medications on file prior to encounter.    Current Outpatient Prescriptions on File Prior to Encounter  Medication Sig Dispense Refill  . acetaminophen (TYLENOL) 500 MG tablet Take 500 mg by mouth every 6 (six) hours as needed.    Marland Kitchen alendronate (FOSAMAX) 70 MG tablet Take 70 mg by mouth once a week. Take with a full glass of water on an empty stomach.    Marland Kitchen alum & mag hydroxide-simeth (MAALOX PLUS) 400-400-40 MG/5ML suspension Take 10 mLs by mouth as needed for indigestion.    Marland Kitchen aspirin 81 MG chewable tablet Chew 81 mg by mouth daily.    . carvedilol (COREG) 6.25 MG tablet Take 3.125 mg by mouth 2 (two) times daily with a meal.    . cholecalciferol (VITAMIN D) 1000 units tablet Take 1,000 Units by mouth daily.    . divalproex (DEPAKOTE SPRINKLE) 125 MG capsule Take 250 mg by mouth 2 (two) times daily.    Marland Kitchen docusate (COLACE) 50 MG/5ML liquid Take 100 mg by mouth 2 (two) times daily.    Marland Kitchen donepezil (ARICEPT) 5 MG tablet Take 5 mg by mouth at bedtime.    . ferrous sulfate 325 (65 FE) MG tablet Take 1 tablet (325 mg total) by mouth 3 (three) times daily with  meals. 90 tablet 0  . guaifenesin (ROBITUSSIN) 100 MG/5ML syrup Take 200 mg by mouth 4 (four) times daily as needed for cough.    Marland Kitchen HYDROcodone-acetaminophen (NORCO/VICODIN) 5-325 MG tablet Take 1 tablet by mouth every 6 (six) hours as needed for moderate pain. 12 tablet 0  . latanoprost (XALATAN) 0.005 % ophthalmic solution Place 1 drop  into both eyes at bedtime.  5  . lisinopril (PRINIVIL,ZESTRIL) 10 MG tablet Take 10 mg by mouth daily.    Marland Kitchen loperamide (IMODIUM) 2 MG capsule Take by mouth as needed for diarrhea or loose stools.    Marland Kitchen LORazepam (ATIVAN) 0.5 MG tablet Take 1 tablet (0.5 mg total) by mouth every 8 (eight) hours. 30 tablet 0  . magnesium hydroxide (MILK OF MAGNESIA) 400 MG/5ML suspension Take 30 mLs by mouth at bedtime as needed for mild constipation.    . mirtazapine (REMERON) 7.5 MG tablet Take 7.5 mg by mouth at bedtime.    Marland Kitchen neomycin-bacitracin-polymyxin (NEOSPORIN) [phone] ointment Apply 1 application topically 4 (four) times daily.    . risperiDONE (RISPERDAL) 0.5 MG tablet Take 0.5 mg by mouth at bedtime.      ____________________________________________  ED COURSE / ASSESSMENT AND PLAN  Pertinent labs & imaging results that were available during my care of the patient were reviewed by me and considered in my medical decision making (see chart for details).   it's unclear to me what initially triggered the altered mental status event for which EMS found this patient in respiratory distress and respiratory failure and near cardiovascular collapse for which she was intubated and cardiac paced until she arrived here and was able to be taken off cardiac pacing in her native rhythm was in the 90s with a good blood pressure.  Is seen that perhaps she had a respiratory event or even aspiration causing the respiratory event which then led to cardiac instability which is now resolved given that adequate intubation and oxygenation at this point.  Patient was being cleaned up  and ready for CT scan to evaluate for possible neurologic source for the initial altered mental status event, however patient dropped her heart rate into the 30s, on the monitor it looked like complete heart block, EKG was not obtained as the patient was being readied for external cardiac pacing. Patient was captured for just a few seconds and then patient was back in her own and sinusrhythm in the 90s.  heart rate dropped into sinus 80s., 70s and 50s over next 45 minutes with adequate blood pressure.  Patient sedated on minimal fentanyl and versed drips.  Was biting tube, now not.  I reviewed patients demographic, and patient is ward of DSS.  I left message with on call administrator at [phone], no answer.  I spoke with on call cardiology regarding what now appears to be more of a primary cardiac arrhythmia/block as the initiating event.   Since came back around with short transcutaneous pacing, recommending this approach, certainly as we try to attempt to find the ultimate wishes of the guardian.  DIFFERENTIAL DIAGNOSIS: Differential diagnosis includes, but is not limited to, alcohol, illicit or prescription medications, or other toxic ingestion; intracranial pathology such as stroke or intracerebral hemorrhage; fever or infectious causes including sepsis; hypoxemia and/or hypercarbia; uremia; trauma; endocrine related disorders such as diabetes, hypoglycemia, and thyroid-related diseases; hypertensive encephalopathy; etc.   CONSULTATIONS:  Hospitalist, Dr. Elpidio Anis for admission.  Cardiology Dr. Mariah Milling, recommends as needed transcutaneous pacing as needed, hopefully may not need temp pacer or permanent pacer.  Will consult, patient to be admitted to the hospitalist service.   Patient / Family / Caregiver informed of clinical course, medical decision-making process, and agree with plan.  ___________________________________________   FINAL CLINICAL IMPRESSION(S) / ED DIAGNOSES   Final  diagnoses:  Altered mental status, unspecified altered mental status type  Acute respiratory failure with hypoxia (  HCC)  Aspiration into airway, initial encounter  Troponin level elevated              Note: This dictation was prepared with Dragon dictation. Any transcriptional errors that result from this process are unintentional    Governor Rooks, MD 08/23/2017 [phone]

## 2017-08-29 NOTE — ED Notes (Signed)
Pt heart rate went to 30's, paced her for 20 seconds and her heart began to climb into the 70's again.

## 2017-08-29 NOTE — Consult Note (Signed)
Cardiology Consult    Patient ID: BURNETT LIEBER MRN: [id number], DOB/AGE: 81-Apr-1925   Admit date: 28-Sep-2017 Date of Consult: 09-28-17  Primary Physician: [facility], [facility] Primary Cardiologist: New - T. Mariah Milling, MD  Requesting Provider: S. Allena Katz, MD  Patient Profile    Yesenia Leonard is a 83 y.o. female with a history of dementia, HTN, osteoporosis, and elevated troponin, who is being seen today for the evaluation of resp failure and jxnl bradycardia at the request of Dr. Allena Katz.  Past Medical History   Past Medical History:  Diagnosis Date  . Anemia    a. 12/2016 s/p PRBCs - following hip surgery.  . Dementia   . Hypercalcemia   . Hypertension   . Hypokalemia   . Orthostatic hypotension   . Osteoporosis   . PPD positive   . Pre-diabetes   . S/P right hip fracture    a. 12/2016 s/p IM Nail.    Past Surgical History:  Procedure Laterality Date  . APPENDECTOMY    . INTRAMEDULLARY (IM) NAIL INTERTROCHANTERIC Right 12/19/2016   Procedure: INTRAMEDULLARY (IM) NAIL INTERTROCHANTRIC;  Surgeon: Christena Flake, MD;  Location: [facility] ORS;  Service: Orthopedics;  Laterality: Right;     Allergies  No Known Allergies  History of Present Illness    83 y/o ? with the above PMH including dementia, HTN, and osteoporosis.  She is under the legal guardianship of an assigned Child psychotherapist and lives @ [facility].  She is currently intubated and sedated and unable to provide any history.  Chart reviewed in detail.  She was recently admitted to [facility] in the setting of UTI, AMS/encephalopathy, and mild troponin elevation (peak 0.08)  Following abx treatment and return to baseline mental status, she was d/c'd back to [facility] on 10/5.  Per records, her baseline level of activity is limited.  Today, she was found unresponsive, slumped in her wheelchair.  She was apparently covered in emesis and there was concern for aspiration.  EMS was called and pt taken to  the [facility] ED.  Here, she was noted to be bradycardic with thready pulses.  TC pacer pads placed.  She was also intubated.  She was noted to have intermittent jxnl rhythm, down into the mid 30's.  Per nsg staff, she was intermittently paced x 2 but never lost a pulse.  Lowest recorded bp was 91/59.  Currently, bp 155/81.  She is in sinus rhythm in the 50's to 60's.  She is on coreg and aricept @ home.  Home Medications    Prior to Admission medications   Medication Sig Start Date End Date Taking? Authorizing Provider  alendronate (FOSAMAX) 70 MG tablet Take 70 mg by mouth once a week. Take with a full glass of water on an empty stomach.   Yes [provider]  alum & mag hydroxide-simeth (MAALOX PLUS) 400-400-40 MG/5ML suspension Take 10 mLs by mouth as needed for indigestion.   Yes [provider]  aspirin 81 MG chewable tablet Chew 81 mg by mouth daily.   Yes [provider]  carvedilol (COREG) 6.25 MG tablet Take 3.125 mg by mouth 2 (two) times daily with a meal.   Yes [provider]  cholecalciferol (VITAMIN D) 1000 units tablet Take 1,000 Units by mouth daily.   Yes [provider]  divalproex (DEPAKOTE SPRINKLE) 125 MG capsule Take 250 mg by mouth 2 (two) times daily.   Yes [provider]  docusate (COLACE)  50 MG/5ML liquid Take 100 mg by mouth 2 (two) times daily.   Yes [provider]  donepezil (ARICEPT) 5 MG tablet Take 5 mg by mouth at bedtime.   Yes [provider]  ferrous sulfate 325 (65 FE) MG tablet Take 1 tablet (325 mg total) by mouth 3 (three) times daily with meals. 12/22/16  Yes [name], MD  guaifenesin (ROBITUSSIN) 100 MG/5ML syrup Take 200 mg by mouth 4 (four) times daily as needed for cough.   Yes [provider]  hydrochlorothiazide (HYDRODIURIL) 12.5 MG tablet Take 12.5 mg by mouth daily.   Yes [provider]  HYDROcodone-acetaminophen (NORCO/VICODIN) 5-325 MG tablet Take 1 tablet  by mouth every 6 (six) hours as needed for moderate pain. 08/21/17  Yes Auburn Bilberry, MD  latanoprost (XALATAN) 0.005 % ophthalmic solution Place 1 drop into both eyes at bedtime. 11/28/16  Yes [provider]  lisinopril (PRINIVIL,ZESTRIL) 10 MG tablet Take 10 mg by mouth daily.   Yes [provider]  LORazepam (ATIVAN) 0.5 MG tablet Take 1 tablet (0.5 mg total) by mouth every 8 (eight) hours. 08/21/17  Yes Auburn Bilberry, MD  LORazepam (ATIVAN) 1 MG tablet Take 1 mg by mouth every 8 (eight) hours as needed for anxiety.   Yes [provider]  mirtazapine (REMERON) 7.5 MG tablet Take 7.5 mg by mouth at bedtime.   Yes [provider]  risperiDONE (RISPERDAL) 0.5 MG tablet Take 0.5 mg by mouth at bedtime.   Yes [provider]  acetaminophen (TYLENOL) 500 MG tablet Take 500 mg by mouth every 6 (six) hours as needed.    [provider]  loperamide (IMODIUM) 2 MG capsule Take by mouth as needed for diarrhea or loose stools.    [provider]  magnesium hydroxide (MILK OF MAGNESIA) 400 MG/5ML suspension Take 30 mLs by mouth at bedtime as needed for mild constipation.    [provider]  neomycin-bacitracin-polymyxin (NEOSPORIN) [phone] ointment Apply 1 application topically 4 (four) times daily.    [provider]      Family History - obtained from [facility] records as pt is intubated and sedated.    Family History  Problem Relation Age of Onset  . Hypertension Mother     Social History - obtained from [facility] records as pt is intubated and sedated.    Social History   Social History  . Marital status: Widowed    Spouse name: N/A  . Number of children: N/A  . Years of education: N/A   Occupational History  . retired    Social History Main Topics  . Smoking status: Never Smoker  . Smokeless tobacco: Never Used  . Alcohol use No  . Drug use: No  . Sexual activity: No   Other Topics Concern  . Not on file     Social History Narrative   Lives @ [facility]     Review of Systems    *Unable to obtain ROS 2/2 critical illness req intubation and sedation.  Physical Exam    Blood pressure (!) 155/81, pulse 64, temperature 98.1 F (36.7 C), temperature source Rectal, resp. rate (!) 22, height  (1.676 m), weight 147 lb (66.7 kg), SpO2 100 %.  General: Pt intubated and sedated. Psych: Sedated. Neuro: Sedated - will not follow commands. HEENT: Normal  Neck: Supple without bruits or JVD. Lungs:  Resp regular and unlabored, coarse breath sounds throughout. Heart: RRR, distant, no s3, s4, or murmurs. Abdomen:  Soft, non-tender, non-distended, BS + x 4.  Extremities: No clubbing, cyanosis.  2+ LE edema. DP/PT/Radials 2+ and equal bilaterally.  Labs     Recent Labs  08/28/2017 1241  TROPONINI 0.08*   Lab Results  Component Value Date   WBC 14.4 (H) 09/13/2017   HGB 14.2 08/25/2017   HCT 43.9 09/08/2017   MCV 92.4 08/23/2017   PLT 217 08/26/2017     Recent Labs Lab 08/27/2017 1241  NA 141  K 4.0  CL 102  CO2 28  BUN 30*  CREATININE 1.30*  CALCIUM 11.2*  PROT 7.1  BILITOT 0.9  ALKPHOS 91  ALT 15  AST 28  GLUCOSE 242*    Radiology Studies    Ct Head Wo Contrast  Result Date: 08/19/2017 CLINICAL DATA:  Altered level of consciousness. Patient fell earlier tonight. EXAM: CT HEAD WITHOUT CONTRAST TECHNIQUE: Contiguous axial images were obtained from the base of the skull through the vertex without intravenous contrast. COMPARISON:  06/18/2017 FINDINGS: Brain: Diffuse cerebral atrophy. Ventricular dilatation consistent with central atrophy. Low-attenuation changes in the deep white matter consistent with small vessel ischemia. No mass effect or midline shift. No abnormal extra-axial fluid collections. Gray-white matter junctions are distinct. Basal cisterns are not effaced. No acute intracranial hemorrhage. Vascular: Vascular calcifications are present in the internal  carotid arteries. Skull: Calvarium appears intact. Sinuses/Orbits: Paranasal sinuses are clear. Partial opacification of the right mastoid air cells. Other: No significant changes since previous study. IMPRESSION: No acute intracranial abnormalities. Chronic atrophy and small vessel ischemic changes. Right mastoid effusion. Electronically Signed   By: Burman Nieves M.D.   On: 08/19/2017 04:21   Dg Chest Port 1 View  Result Date: 09/03/2017 CLINICAL DATA:  83 year old female with a history of unresponsiveness EXAM: PORTABLE CHEST 1 VIEW COMPARISON:  08/19/2017 FINDINGS: Cardiomediastinal silhouette unchanged in size and contour. Defibrillator pads on the chest wall. Endotracheal tube has been placed, which terminates at the clavicular heads, approximately 9.6 cm above the carina. No confluent airspace disease or pneumothorax. IMPRESSION: Interval placement of endotracheal tube which terminates at the clavicular heads, approximately 9.6 cm above the carina. Defibrillator pads on the chest wall. Electronically Signed   By: Gilmer Mor D.O.   On: 08/19/2017 13:02   Dg Chest Port 1 View  Result Date: 08/19/2017 CLINICAL DATA:  Altered mental status. Patient fell earlier in the night. EXAM: PORTABLE CHEST 1 VIEW COMPARISON:  06/14/2017 FINDINGS: Shallow inspiration. Heart size and pulmonary vascularity are normal. Linear scarring in the left lung base is unchanged since prior study. No airspace disease or consolidation in the lungs. No pneumothorax. No pleural effusions. Calcification of the aorta. Degenerative changes in the spine and shoulders. IMPRESSION: No evidence of active pulmonary disease.  Aortic atherosclerosis. Electronically Signed   By: Burman Nieves M.D.   On: 08/19/2017 03:33    ECG & Cardiac Imaging    Sinus tach, 111, LAD, LAFB, LVH  Assessment & Plan    1.  Acute resp failure:  Pt found unresponsive with emesis on her clothing.  Concern for aspiration.  Intubated, sedated.   Afebrile.  WBC elevated.  Vent mgmt, abx per IM/CCM.  Will need to address Code status and goals of care.  2.  Junctional Bradycardia:  In setting of above.  Pt developed jxnl brady with rates in 30's while in ED.  She was briefly transcutaneously paced.  Per nsg staff, she never lost a pulse.  Lowest recorded BP 91/59.  Currently sinus bradycardia.  She is on coreg @ home.  Aricept is also known to potentially contribute to bradycardia.  Would hold both going forward.   If recurrent bradycardia w/ hypotension, can consider dopamine.  Likely not a good candidate for pacing.  As above, will need to address Code status and goals of care.  Consider echo.  3.  AKI:  In setting of above.  Follow.  4.  Elevated Troponin:  Mild trop elevation in setting of above - 0.08.  She also had mild, flat troponin elevation on last admission a week ago.  Doubt ACS.  Cont to cycle.  Check echo if in line with goals of care.  5.  Lower ext edema:  ? Chronicity. No edema mentioned in 10/4 progress note. As above, consider echo.  Signed, Nicolasa Ducking, NP Sep 20, 2017, 4:40 PM  For questions or updates, please contact   Please consult [email or web address] for contact info under Cardiology/STEMI.

## 2017-08-29 NOTE — Progress Notes (Signed)
ANTICOAGULATION CONSULT NOTE   Pharmacy Consult for Heparin Indication: stroke  No Known Allergies  Patient Measurements: Height:  (167.6 cm) Weight: 154 lb 12.2 oz (70.2 kg) IBW/kg (Calculated) : 59.3 Heparin Dosing Weight: 70.2 kg  Vital Signs: Temp: 98.9 F (37.2 C) (10/12 1730) Temp Source: Oral (10/12 1730) BP: 150/74 (10/12 1900) Pulse Rate: 64 (10/12 1455)  Labs:  Recent Labs  08/25/2017 1241 09/01/2017 1740  HGB 14.2  --   HCT 43.9  --   PLT 217  --   LABPROT 14.0  --   INR 1.09  --   CREATININE 1.30*  --   TROPONINI 0.08* 0.04*    Estimated Creatinine Clearance: 26.4 mL/min (A) (by C-G formula based on SCr of 1.3 mg/dL (H)).   Medical History: Past Medical History:  Diagnosis Date  . Anemia    a. 12/2016 s/p PRBCs - following hip surgery.  . Dementia   . Hypercalcemia   . Hypertension   . Hypokalemia   . Orthostatic hypotension   . Osteoporosis   . PPD positive   . Pre-diabetes   . S/P right hip fracture    a. 12/2016 s/p IM Nail.    Assessment: 83 y/o F admitted with acute encephalopathy and respiratory failure s/p CVA.   Goal of Therapy:  Heparin level 0.3-0.5 units/ml Monitor platelets by anticoagulation protocol: Yes   Plan:  Start heparin infusion at 850 units/hr Check anti-Xa level in 8 hours and daily while on heparin Continue to monitor H&H and platelets  Luisa Hart D 09/04/2017,7:47 PM

## 2017-08-29 NOTE — H&P (Addendum)
[employer] - [place] at [facility]   PATIENT NAME: Yesenia Leonard    MR#:  [id number]  DATE OF BIRTH:  Sep 26, 1924  DATE OF ADMISSION:  08/31/2017  PRIMARY CARE PHYSICIAN: [facility]   REQUESTING/REFERRING PHYSICIAN: Governor Rooks MD  CHIEF COMPLAINT:   Chief Complaint  Patient presents with  . Respiratory Arrest    HISTORY OF PRESENT ILLNESS: Yesenia Leonard  is a 83 y.o. female with a known history of  Dementia, essential hypertension, osteoporosis, who was admitted recently for agitation and was noted to have a urinary tract infection. Patient's mental status improved and was back to baseline and therefore was discharged back to her assisted living facility.patient was found unresponsive and had a negative adequate breathing slumped in her wheelchair at the facility. With emesis and questionable aspiration. Patient in the emergency room was noted to have poor pulses and was bradycardic. She was intubated. She had to be paced twice. The ED physician has contacted the ICU physician as well as the cardiologist. Patient currently on sedation  PAST MEDICAL HISTORY:   Past Medical History:  Diagnosis Date  . Dementia   . Hypercalcemia   . Hypertension   . Hypokalemia   . Osteoporosis   . PPD positive   . Pre-diabetes     PAST SURGICAL HISTORY: Past Surgical History:  Procedure Laterality Date  . APPENDECTOMY    . INTRAMEDULLARY (IM) NAIL INTERTROCHANTERIC Right 12/19/2016   Procedure: INTRAMEDULLARY (IM) NAIL INTERTROCHANTRIC;  Surgeon: Christena Flake, MD;  Location: [facility] ORS;  Service: Orthopedics;  Laterality: Right;    SOCIAL HISTORY:  Social History  Substance Use Topics  . Smoking status: Never Smoker  . Smokeless tobacco: Never Used  . Alcohol use No    FAMILY HISTORY:  Family History  Problem Relation Age of Onset  . Hypertension Mother     DRUG ALLERGIES: No Known Allergies  REVIEW OF SYSTEMS:   CONSTITUTIONAL:  intubated  MEDICATIONS AT HOME:  Prior to Admission medications   Medication Sig Start Date End Date Taking? Authorizing Provider  alendronate (FOSAMAX) 70 MG tablet Take 70 mg by mouth once a week. Take with a full glass of water on an empty stomach.   Yes [provider]  alum & mag hydroxide-simeth (MAALOX PLUS) 400-400-40 MG/5ML suspension Take 10 mLs by mouth as needed for indigestion.   Yes [provider]  aspirin 81 MG chewable tablet Chew 81 mg by mouth daily.   Yes [provider]  carvedilol (COREG) 6.25 MG tablet Take 3.125 mg by mouth 2 (two) times daily with a meal.   Yes [provider]  cholecalciferol (VITAMIN D) 1000 units tablet Take 1,000 Units by mouth daily.   Yes [provider]  divalproex (DEPAKOTE SPRINKLE) 125 MG capsule Take 250 mg by mouth 2 (two) times daily.   Yes [provider]  docusate (COLACE) 50 MG/5ML liquid Take 100 mg by mouth 2 (two) times daily.   Yes [provider]  donepezil (ARICEPT) 5 MG tablet Take 5 mg by mouth at bedtime.   Yes [provider]  ferrous sulfate 325 (65 FE) MG tablet Take 1 tablet (325 mg total) by mouth 3 (three) times daily with meals. 12/22/16  Yes [name], MD  guaifenesin (ROBITUSSIN) 100 MG/5ML syrup Take 200 mg by mouth 4 (four) times daily as needed for cough.   Yes [provider]  hydrochlorothiazide (HYDRODIURIL) 12.5 MG tablet Take 12.5 mg by mouth  daily.   Yes [provider]  HYDROcodone-acetaminophen (NORCO/VICODIN) 5-325 MG tablet Take 1 tablet by mouth every 6 (six) hours as needed for moderate pain. 08/21/17  Yes Auburn Bilberry, MD  latanoprost (XALATAN) 0.005 % ophthalmic solution Place 1 drop into both eyes at bedtime. 11/28/16  Yes [provider]  lisinopril (PRINIVIL,ZESTRIL) 10 MG tablet Take 10 mg by mouth daily.   Yes [provider]  LORazepam (ATIVAN) 0.5 MG tablet Take 1 tablet (0.5 mg total) by  mouth every 8 (eight) hours. 08/21/17  Yes Auburn Bilberry, MD  LORazepam (ATIVAN) 1 MG tablet Take 1 mg by mouth every 8 (eight) hours as needed for anxiety.   Yes [provider]  mirtazapine (REMERON) 7.5 MG tablet Take 7.5 mg by mouth at bedtime.   Yes [provider]  risperiDONE (RISPERDAL) 0.5 MG tablet Take 0.5 mg by mouth at bedtime.   Yes [provider]  acetaminophen (TYLENOL) 500 MG tablet Take 500 mg by mouth every 6 (six) hours as needed.    [provider]  loperamide (IMODIUM) 2 MG capsule Take by mouth as needed for diarrhea or loose stools.    [provider]  magnesium hydroxide (MILK OF MAGNESIA) 400 MG/5ML suspension Take 30 mLs by mouth at bedtime as needed for mild constipation.    [provider]  neomycin-bacitracin-polymyxin (NEOSPORIN) [phone] ointment Apply 1 application topically 4 (four) times daily.    [provider]      PHYSICAL EXAMINATION:   VITAL SIGNS: Blood pressure 99/72, pulse 64, temperature 98.1 F (36.7 C), temperature source Rectal, resp. rate 16, height  (1.676 m), weight 147 lb (66.7 kg), SpO2 100 %.  GENERAL:  83 y.o.-year-old patient lying in the bed critically ill EYES: Pupils equal, round, reactive to light and accommodation. No scleral icterus. Extraocular muscles intact.  HEENT: Head atraumatic, normocephalic. Oropharynx and nasopharynx clear.  NECK:  Supple, no jugular venous distention. No thyroid enlargement, no tenderness.  LUNGS: Normal breath sounds bilaterally, no wheezing, rales,rhonchi or crepitation. No use of accessory muscles of respiration ET tube in place.  CARDIOVASCULAR: S1, S2 normal. No murmurs, rubs, or gallops.  ABDOMEN: Soft, nontender, nondistended. Bowel sounds present. No organomegaly or mass.  EXTREMITIES: 2+ pedal edema, cyanosis, or clubbing.  NEUROLOGIC: sedated PSYCHIATRIC: sedated SKIN: No obvious rash, lesion, or ulcer.   LABORATORY  PANEL:   CBC  Recent Labs Lab Sep 03, 2017 1241  WBC 14.4*  HGB 14.2  HCT 43.9  PLT 217  MCV 92.4  MCH 29.9  MCHC 32.4  RDW 14.2  LYMPHSABS 1.8  MONOABS 0.8  EOSABS 0.1  BASOSABS 0.1   ------------------------------------------------------------------------------------------------------------------  Chemistries   Recent Labs Lab 09/03/2017 1241  NA 141  K 4.0  CL 102  CO2 28  GLUCOSE 242*  BUN 30*  CREATININE 1.30*  CALCIUM 11.2*  AST 28  ALT 15  ALKPHOS 91  BILITOT 0.9   ------------------------------------------------------------------------------------------------------------------ estimated creatinine clearance is 26.4 mL/min (A) (by C-G formula based on SCr of 1.3 mg/dL (H)). ------------------------------------------------------------------------------------------------------------------ No results for input(s): TSH, T4TOTAL, T3FREE, THYROIDAB in the last 72 hours.  Invalid input(s): FREET3   Coagulation profile  Recent Labs Lab 03-Sep-2017 1241  INR 1.09   ------------------------------------------------------------------------------------------------------------------- No results for input(s): DDIMER in the last 72 hours. -------------------------------------------------------------------------------------------------------------------  Cardiac Enzymes  Recent Labs Lab 2017-09-03 1241  TROPONINI 0.08*   ------------------------------------------------------------------------------------------------------------------ Invalid input(s): POCBNP  ---------------------------------------------------------------------------------------------------------------  Urinalysis    Component Value Date/Time   COLORURINE STRAW (A)  08/19/2017 0321   APPEARANCEUR CLEAR (A) 08/19/2017 0321   LABSPEC 1.011 08/19/2017 0321   PHURINE 7.0 08/19/2017 0321   GLUCOSEU 150 (A) 08/19/2017 0321   HGBUR MODERATE (A) 08/19/2017 0321   BILIRUBINUR NEGATIVE 08/19/2017 0321    KETONESUR NEGATIVE 08/19/2017 0321   PROTEINUR 100 (A) 08/19/2017 0321   NITRITE NEGATIVE 08/19/2017 0321   LEUKOCYTESUR MODERATE (A) 08/19/2017 0321     RADIOLOGY: Dg Chest Port 1 View  Result Date: 08/28/2017 CLINICAL DATA:  83 year old female with a history of unresponsiveness EXAM: PORTABLE CHEST 1 VIEW COMPARISON:  08/19/2017 FINDINGS: Cardiomediastinal silhouette unchanged in size and contour. Defibrillator pads on the chest wall. Endotracheal tube has been placed, which terminates at the clavicular heads, approximately 9.6 cm above the carina. No confluent airspace disease or pneumothorax. IMPRESSION: Interval placement of endotracheal tube which terminates at the clavicular heads, approximately 9.6 cm above the carina. Defibrillator pads on the chest wall. Electronically Signed   By: Gilmer Mor D.O.   On: 08/31/2017 13:02    EKG: Orders placed or performed during the hospital encounter of 08/19/17  . EKG 12-Lead  . EKG 12-Lead    IMPRESSION AND PLAN: Patient is a 83 year old found unresponsive now intubated  1. Acute respiratory failure in setting of unresponsiveness Etiology unclear Continue ventilator for respiratory support There was some concern of aspiration pneumonia so we'll treat with IV antibiotics with vancomycin and Zosyn  2. Bradycardia currently she is in normal sinus rhythm heart rate stable cardiology recommends dopamine if the heart rate drops further Will obtain echocardiogram of the heart follow troponin levels  3. Essential hypertension O blood pressure medication  4. Elevated troponin likely due to demand ischemia  5. Unspecified dementia hold her medications  6. CODE STATUS patient is a full code she is a ward of the state   All the records are reviewed and case discussed with ED provider. Management plans discussed with the patient, family and they are in agreement.  CODE STATUS: Code Status History    Date Active Date Inactive Code  Status Order ID Comments User Context   08/19/2017  5:42 AM 08/22/2017  3:54 PM Full Code [id number]  Ihor Austin, MD Inpatient   12/19/2016  6:59 PM 12/19/2016  6:59 PM Full Code [id number]  Auburn Bilberry, MD Inpatient   12/19/2016  6:59 PM 12/23/2016 10:45 PM Full Code [id number]  Poggi, Excell Seltzer, MD Inpatient       TOTAL TIME TAKING CARE OF THIS PATIENT: 55 minutes critical care time spent   Auburn Bilberry M.D on 08/25/2017 at 4:01 PM  Between 7am to 6pm - Pager - [phone]  After 6pm go to [email or web address] - password [email or web address] [facility]  [facility]  Office  [phone]  CC: Primary care physician; [facility], [facility]

## 2017-08-29 NOTE — Progress Notes (Signed)
Day shift RN reported that Dr. Mariah Milling was in to see patient around 45 and he does not want patient to be externally paced. Would prefer patient to be placed on a low dose dopamine gtt if HR sustaining in the 30s. Only externally pace if dopamine does not work, and if the patient needs temp pacer, will have to call on call cardiologist to come in to place pacer wires. Will monitor progress.

## 2017-08-29 NOTE — Consult Note (Signed)
PULMONARY / CRITICAL CARE MEDICINE   Name: Yesenia Leonard MRN: [id number] DOB: 1924/10/07    ADMISSION DATE:  08/26/2017 CONSULTATION DATE: 09/10/2017  REFERRING MD: Dr. Reita Cliche  CHIEF COMPLAINT: Unresponsive   HISTORY OF PRESENT ILLNESS:   This is a 83 yo female with a PMH of Pre-diabetes, PPD positive, Osteoporosis, Hypokalemia, Hypercalcemia, HTN, and Dementia.  She presented to [facility] ER 10/12 via EMS from [facility] after being found in the dining room unresponsive with inadequate respirations with large amount of emesis likely aspirated.  Upon EMS arrival her heart rate was in the 40's with a thready pulse requiring cardiac pacing, therefore EMS intubated at the scene.  In the ER she was in NSR and no longer required pacing.  Lab results revealed creatinine 1.30, calcium 11.2, troponin 0.08, and wbc 14.4.  She was subsequently admitted to ICU by hospitalist team for further workup and treatment PCCM consulted for vent management.    PAST MEDICAL HISTORY :  She  has a past medical history of Dementia; Hypercalcemia; Hypertension; Hypokalemia; Osteoporosis; PPD positive; and Pre-diabetes.  PAST SURGICAL HISTORY: She  has a past surgical history that includes Appendectomy and Intramedullary (im) nail intertrochanteric (Right, 12/19/2016).  No Known Allergies  No current facility-administered medications on file prior to encounter.    Current Outpatient Prescriptions on File Prior to Encounter  Medication Sig  . acetaminophen (TYLENOL) 500 MG tablet Take 500 mg by mouth every 6 (six) hours as needed.  Marland Kitchen alendronate (FOSAMAX) 70 MG tablet Take 70 mg by mouth once a week. Take with a full glass of water on an empty stomach.  Marland Kitchen alum & mag hydroxide-simeth (MAALOX PLUS) 400-400-40 MG/5ML suspension Take 10 mLs by mouth as needed for indigestion.  Marland Kitchen aspirin 81 MG chewable tablet Chew 81 mg by mouth daily.  . carvedilol (COREG) 6.25 MG tablet Take 3.125 mg by mouth 2 (two) times daily with a  meal.  . cholecalciferol (VITAMIN D) 1000 units tablet Take 1,000 Units by mouth daily.  . divalproex (DEPAKOTE SPRINKLE) 125 MG capsule Take 250 mg by mouth 2 (two) times daily.  Marland Kitchen docusate (COLACE) 50 MG/5ML liquid Take 100 mg by mouth 2 (two) times daily.  Marland Kitchen donepezil (ARICEPT) 5 MG tablet Take 5 mg by mouth at bedtime.  . ferrous sulfate 325 (65 FE) MG tablet Take 1 tablet (325 mg total) by mouth 3 (three) times daily with meals.  Marland Kitchen guaifenesin (ROBITUSSIN) 100 MG/5ML syrup Take 200 mg by mouth 4 (four) times daily as needed for cough.  Marland Kitchen HYDROcodone-acetaminophen (NORCO/VICODIN) 5-325 MG tablet Take 1 tablet by mouth every 6 (six) hours as needed for moderate pain.  Marland Kitchen latanoprost (XALATAN) 0.005 % ophthalmic solution Place 1 drop into both eyes at bedtime.  Marland Kitchen lisinopril (PRINIVIL,ZESTRIL) 10 MG tablet Take 10 mg by mouth daily.  Marland Kitchen loperamide (IMODIUM) 2 MG capsule Take by mouth as needed for diarrhea or loose stools.  Marland Kitchen LORazepam (ATIVAN) 0.5 MG tablet Take 1 tablet (0.5 mg total) by mouth every 8 (eight) hours.  . magnesium hydroxide (MILK OF MAGNESIA) 400 MG/5ML suspension Take 30 mLs by mouth at bedtime as needed for mild constipation.  . mirtazapine (REMERON) 7.5 MG tablet Take 7.5 mg by mouth at bedtime.  Marland Kitchen neomycin-bacitracin-polymyxin (NEOSPORIN) [phone] ointment Apply 1 application topically 4 (four) times daily.  . risperiDONE (RISPERDAL) 0.5 MG tablet Take 0.5 mg by mouth at bedtime.    FAMILY HISTORY:  Her indicated that her mother is deceased. She  indicated that her father is deceased.    SOCIAL HISTORY: She  reports that she has never smoked. She has never used smokeless tobacco. She reports that she does not drink alcohol or use drugs.  REVIEW OF SYSTEMS:   Unable to assess pt intubated   SUBJECTIVE:  Unable to assess pt intubated   VITAL SIGNS: BP 121/85   Pulse 62   Resp (!) 21   Ht 5' 6"  (1.676 m)   Wt 66.7 kg (147 lb)   SpO2 100%   BMI 23.73 kg/m    HEMODYNAMICS:    VENTILATOR SETTINGS: Vent Mode: AC FiO2 (%):  [50 %-100 %] 50 % Set Rate:  [16 bmp] 16 bmp Vt Set:  [450 mL] 450 mL PEEP:  [5 cmH20] 5 cmH20  INTAKE / OUTPUT: No intake/output data recorded.  PHYSICAL EXAMINATION: General: frail AA female, NAD  Neuro: sedated, biting ET tube not following commands, PERRL  HEENT: supple, no JVD  Cardiovascular: nsr with pvc's, s1s2,no M/R/G Lungs: rhonchi throughout, even, non labored mechanically intubated  Abdomen: +BS x4, soft, non tender, non distended  Musculoskeletal: normal tone, 2+ bilateral lower extremity pitting edema  Skin: left foot skin tear, reddened blanchable area of sacral spine  LABS:  BMET  Recent Labs Lab 09/07/2017 1241  NA 141  K 4.0  CL 102  CO2 28  BUN 30*  CREATININE 1.30*  GLUCOSE 242*    Electrolytes  Recent Labs Lab 08/22/2017 1241  CALCIUM 11.2*    CBC  Recent Labs Lab 08/18/2017 1241  WBC 14.4*  HGB 14.2  HCT 43.9  PLT 217    Coag's  Recent Labs Lab 08/28/2017 1241  INR 1.09    Sepsis Markers  Recent Labs Lab 08/19/2017 1241  LATICACIDVEN 3.1*    ABG  Recent Labs Lab 08/25/2017 1246  PHART 7.38  PCO2ART 40  PO2ART 456*    Liver Enzymes  Recent Labs Lab 08/23/2017 1241  AST 28  ALT 15  ALKPHOS 91  BILITOT 0.9  ALBUMIN 3.3*    Cardiac Enzymes  Recent Labs Lab 09/08/2017 1241  TROPONINI 0.08*    Glucose No results for input(s): GLUCAP in the last 168 hours.  Imaging Dg Chest Port 1 View  Result Date: 09/03/2017 CLINICAL DATA:  83 year old female with a history of unresponsiveness EXAM: PORTABLE CHEST 1 VIEW COMPARISON:  08/19/2017 FINDINGS: Cardiomediastinal silhouette unchanged in size and contour. Defibrillator pads on the chest wall. Endotracheal tube has been placed, which terminates at the clavicular heads, approximately 9.6 cm above the carina. No confluent airspace disease or pneumothorax. IMPRESSION: Interval placement of endotracheal  tube which terminates at the clavicular heads, approximately 9.6 cm above the carina. Defibrillator pads on the chest wall. Electronically Signed   By: Corrie Mckusick D.O.   On: 09/03/2017 13:02   STUDIES:  CT Head 10/12>>Interval development of right occipital infarct extending into to the right mesial temporal lobe. Interval development of small left occipital infarct. No associated hemorrhage. Atrophy and small vessel disease. Suspect remote infarct of the right inferior cerebellum  CULTURES: Urine 10/12>>  ANTIBIOTICS: Zosyn 10/12>> Vancomycin 10/12>>  SIGNIFICANT EVENTS: 10/12-Pt admitted to ICU mechanically intubated   LINES/TUBES: ETT 10/12>>  ASSESSMENT / PLAN:  PULMONARY A: Acute respiratory failure likely secondary to aspiration pneumonia  Mechanical Intubation for airway protection  P:   Full vent support wean as tolerated  SBT once all parameter's met Repeat CXR in am  Prn ABG's  VAP bundle  CARDIOVASCULAR A:  Mildly elevated troponin's likely secondary to demand ischemia  Bradycardia  Hx: Hypertension  P:  Continuous telemetry monitoring  Echo pending  Trend troponin's Cardiology consulted appreciate input  Will allow for permissive hypertension in the setting of ischemic stroke  Lipid panel pending   RENAL A:   Acute Renal Failure  Lactic Acidosis  P:   Trend BMP Replace electrolytes as indicated  Monitor UOP NS @100  ml/hr   GASTROINTESTINAL A:   Emesis  P:   Protonix for SUP Keep NPO for now  Aspiration precautions  Prn zofran  HEMATOLOGIC A:   Guaiac positive   P:  Trend CBC Monitor for s/sx of bleeding Transfuse for hgb <7 SCD's for VTE prophylaxis, avoid chemical prophylaxis for now   INFECTIOUS A:   Leukocytosis  Probable aspiration pneumonia  P:   Trend WBC and monitor fever curve Trend PCT and lactic acid  Continue abx as listed above Follow cultures   ENDOCRINE A:   Hyperglycemia  P:   CBG's q4hrs SSI  TSH  pending   NEUROLOGIC A:   Acute encephalopathy secondary to acute CVA Hx: Dementia  P:   RASS goal: 0 to -1 Prn fentanyl and versed gtt to maintain RASS goal and for pain management  WUA daily   Neurology consulted appreciate input    FAMILY  - Updates: No family at bedside to update at this time 08/30/2017  - Inter-disciplinary family meet or Palliative Care meeting due by: 09/05/2017   Marda Stalker, [place] Pager [phone] (please enter 7 digits) PCCM Consult Pager [phone] (please enter 7 digits)

## 2017-08-29 NOTE — Progress Notes (Signed)
Anticoagulation monitoring(Lovenox):  83 yo female ordered Lovenox 40 mg Q24h  Filed Weights   2017/09/14 1249  Weight: 147 lb (66.7 kg)   BMI    Lab Results  Component Value Date   CREATININE 1.30 (H) 09/14/2017   CREATININE 0.80 08/19/2017   CREATININE 0.79 06/18/2017   Estimated Creatinine Clearance: 26.4 mL/min (A) (by C-G formula based on SCr of 1.3 mg/dL (H)). Hemoglobin & Hematocrit     Component Value Date/Time   HGB 14.2 2017-09-14 1241   HCT 43.9 2017/09/14 1241     Per Protocol for Patient with estCrcl < 30 ml/min and BMI < 40, will transition to Lovenox 30 mg Q24h.

## 2017-08-29 NOTE — ED Triage Notes (Signed)
Patient present to the ED via EMS, coming from [facility] pt was found unresponsive in emesis in the dinning room. Her heart rate was in the 40's pulses thready. EMS paced pt. Once she arrived to the ER she was  able maintain a NSR with pulse no longer requiring pacing.

## 2017-08-29 NOTE — Progress Notes (Signed)
Pharmacy Antibiotic Note  Yesenia Leonard is a 83 y.o. female admitted on 09-27-2017 with pneumonia.  Pharmacy has been consulted for vancomycin and Zosyn dosing.  Plan: Hopefully, can d/c vancomycin with negative MRSA PCR.  Vancomycin 1000 mg iv once followed by 750 mg iv q 24 hours with stacked dosing. Will check levels/adjust dosing as necessary to target a goal trough of 15-20 mcg/ml.  Zosyn 3.375g IV q8h (4 hour infusion).  Height:  (167.6 cm) Weight: 154 lb 12.2 oz (70.2 kg) IBW/kg (Calculated) : 59.3  Temp (24hrs), Avg:98.5 F (36.9 C), Min:98.1 F (36.7 C), Max:98.9 F (37.2 C)   Recent Labs Lab September 27, 2017 1241 09/27/2017 1740  WBC 14.4*  --   CREATININE 1.30*  --   LATICACIDVEN 3.1* 3.6*    Estimated Creatinine Clearance: 26.4 mL/min (A) (by C-G formula based on SCr of 1.3 mg/dL (H)).    No Known Allergies  Antimicrobials this admission: 10/12 Zosyn >>  10/12 vancomycin >>   Dose adjustments this admission:   Microbiology results: UCx: sent  10/12 MRSA PCR: negative  Thank you for allowing pharmacy to be a part of this patient's care.  Valentina Gu 2017/09/27 7:43 PM

## 2017-08-30 ENCOUNTER — Inpatient Hospital Stay (HOSPITAL_COMMUNITY)
Admit: 2017-08-30 | Discharge: 2017-08-30 | Disposition: A | Discharge status: Home / Self Care | Payer: Medicare Other | Attending: Internal Medicine | Admitting: Internal Medicine

## 2017-08-30 DIAGNOSIS — R4182 Altered mental status, unspecified: Secondary | ICD-10-CM

## 2017-08-30 DIAGNOSIS — R001 Bradycardia, unspecified: Secondary | ICD-10-CM

## 2017-08-30 DIAGNOSIS — I340 Nonrheumatic mitral (valve) insufficiency: Secondary | ICD-10-CM

## 2017-08-30 LAB — URINALYSIS, COMPLETE (UACMP) WITH MICROSCOPIC
BILIRUBIN URINE: NEGATIVE
Glucose, UA: NEGATIVE mg/dL
KETONES UR: NEGATIVE mg/dL
NITRITE: NEGATIVE
Protein, ur: 30 mg/dL — AB
Specific Gravity, Urine: 1.017 (ref 1.005–1.030)
pH: 5 (ref 5.0–8.0)

## 2017-08-30 LAB — BASIC METABOLIC PANEL
Anion gap: 6 (ref 5–15)
BUN: 36 mg/dL — ABNORMAL HIGH (ref 6–20)
CALCIUM: 9.4 mg/dL (ref 8.9–10.3)
CO2: 29 mmol/L (ref 22–32)
CREATININE: 1.23 mg/dL — AB (ref 0.44–1.00)
Chloride: 110 mmol/L (ref 101–111)
GFR, EST AFRICAN AMERICAN: 43 mL/min — AB (ref >60)
GFR, EST NON AFRICAN AMERICAN: 37 mL/min — AB (ref >60)
Glucose, Bld: 110 mg/dL — ABNORMAL HIGH (ref 65–99)
Potassium: 3.5 mmol/L (ref 3.5–5.1)
SODIUM: 145 mmol/L (ref 135–145)

## 2017-08-30 LAB — HEMOGLOBIN AND HEMATOCRIT, BLOOD
HCT: 39.6 % (ref 35.0–47.0)
HEMOGLOBIN: 13 g/dL (ref 12.0–16.0)

## 2017-08-30 LAB — CBC
HEMATOCRIT: 37.3 % (ref 35.0–47.0)
HEMOGLOBIN: 12.4 g/dL (ref 12.0–16.0)
MCH: 30.3 pg (ref 26.0–34.0)
MCHC: 33.2 g/dL (ref 32.0–36.0)
MCV: 91.2 fL (ref 80.0–100.0)
PLATELETS: 163 10*3/uL (ref 150–440)
RBC: 4.09 MIL/uL (ref 3.80–5.20)
RDW: 14.5 % (ref 11.5–14.5)
WBC: 9.5 10*3/uL (ref 3.6–11.0)

## 2017-08-30 LAB — TROPONIN I: Troponin I: 0.04 ng/mL (ref <0.03)

## 2017-08-30 LAB — LACTIC ACID, PLASMA: Lactic Acid, Venous: 1.8 mmol/L (ref 0.5–1.9)

## 2017-08-30 LAB — HEPARIN LEVEL (UNFRACTIONATED): Heparin Unfractionated: 0.23 IU/mL — ABNORMAL LOW (ref 0.30–0.70)

## 2017-08-30 MED ORDER — HEPARIN (PORCINE) IN NACL 100-0.45 UNIT/ML-% IJ SOLN
1000.0000 [IU]/h | INTRAMUSCULAR | Status: DC
  Administered 2017-08-30: 1000 [IU]/h via INTRAVENOUS

## 2017-08-30 MED ORDER — HEPARIN BOLUS VIA INFUSION
1050.0000 [IU] | Freq: Once | INTRAVENOUS | Status: AC
  Administered 2017-08-30: 1050 [IU] via INTRAVENOUS
  Filled 2017-08-30: qty 1050

## 2017-08-30 MED ORDER — ASPIRIN 325 MG PO TABS
325.0000 mg | ORAL_TABLET | Freq: Every day | ORAL | Status: DC
  Administered 2017-08-30 – 2017-09-01 (×3): 325 mg via ORAL
  Filled 2017-08-30 (×3): qty 1

## 2017-08-30 MED ORDER — DIVALPROEX SODIUM 250 MG PO DR TAB
250.0000 mg | DELAYED_RELEASE_TABLET | Freq: Two times a day (BID) | ORAL | Status: DC
  Administered 2017-08-30 – 2017-09-01 (×4): 250 mg via ORAL
  Filled 2017-08-30 (×5): qty 1

## 2017-08-30 MED ORDER — SODIUM CHLORIDE 0.9 % IV BOLUS (SEPSIS)
500.0000 mL | Freq: Once | INTRAVENOUS | Status: AC
  Administered 2017-08-30: 500 mL via INTRAVENOUS

## 2017-08-30 NOTE — Progress Notes (Signed)
Informed NP that patient lactic acid was 3.6 at beginning of shift, but now pt has decreased urine output, blood pressure has been below 100 systolic with MAP occ less than 65. Requested NS bolus. Order given.

## 2017-08-30 NOTE — Progress Notes (Signed)
Progress Note  Patient Name: Yesenia Leonard Date of Encounter: 08/30/2017  Subjective   No history obtainable  Inpatient Medications    Scheduled Meds: . aspirin  325 mg Oral Daily  . chlorhexidine gluconate (MEDLINE KIT)  15 mL Mouth Rinse BID  . divalproex  250 mg Oral Q12H  . mouth rinse  15 mL Mouth Rinse 10 times per day   Continuous Infusions: . sodium chloride 100 mL/hr at 08/30/17 0400  . fentaNYL infusion INTRAVENOUS 200 mcg/hr (08/30/17 0401)  . midazolam (VERSED) infusion 1.5 mg/hr (08/30/17 1001)  . piperacillin-tazobactam (ZOSYN)  IV Stopped (08/30/17 1001)   PRN Meds: acetaminophen **OR** acetaminophen, fentaNYL, ondansetron **OR** ondansetron (ZOFRAN) IV   Vital Signs    Vitals:   08/30/17 0700 08/30/17 0732 08/30/17 0800 08/30/17 1115  BP: (!) 98/51  127/89   Pulse: 88  100   Resp: 18  (!) 27   Temp:   100.1 F (37.8 C)   TempSrc:   Oral   SpO2: 100% 100% 99% 99%  Weight:      Height:        Intake/Output Summary (Last 24 hours) at 08/30/17 1432 Last data filed at 08/30/17 0600  Gross per 24 hour  Intake          1657.88 ml  Output              151 ml  Net          1506.88 ml   Filed Weights   09/05/2017 1249 08/30/2017 1730  Weight: 147 lb (66.7 kg) 154 lb 12.2 oz (70.2 kg)    Telemetry    Sinus rhythm with PAC's - Personally Reviewed  Physical Exam  Elderly frail appearing woman GEN: intubated Neck: No JVD Cardiac: RRR, no murmurs  Respiratory: Clear to auscultation bilaterally. GI: Soft, non-distended  MS: No edema Neuro:  Nonfocal  Psych: Normal affect   Labs    Chemistry Recent Labs Lab 08/23/2017 1241 08/30/17 0433  NA 141 145  K 4.0 3.5  CL 102 110  CO2 28 29  GLUCOSE 242* 110*  BUN 30* 36*  CREATININE 1.30* 1.23*  CALCIUM 11.2* 9.4  PROT 7.1  --   ALBUMIN 3.3*  --   AST 28  --   ALT 15  --   ALKPHOS 91  --   BILITOT 0.9  --   GFRNONAA 35* 37*  GFRAA 40* 43*  ANIONGAP 11 6     Hematology Recent  Labs Lab 09/17/2017 1241 08/30/17 0037 08/30/17 0433  WBC 14.4*  --  9.5  RBC 4.76  --  4.09  HGB 14.2 13.0 12.4  HCT 43.9 39.6 37.3  MCV 92.4  --  91.2  MCH 29.9  --  30.3  MCHC 32.4  --  33.2  RDW 14.2  --  14.5  PLT 217  --  163    Cardiac Enzymes Recent Labs Lab 09/17/2017 1241 08/21/2017 1740 08/30/17 0433  TROPONINI 0.08* 0.04* 0.04*   No results for input(s): TROPIPOC in the last 168 hours.   BNPNo results for input(s): BNP, PROBNP in the last 168 hours.   DDimer No results for input(s): DDIMER in the last 168 hours.   Radiology    Ct Head Wo Contrast  Result Date: 08/24/2017 CLINICAL DATA:  Yesenia Leonard is a 83 y.o. female with a known history of Dementia, essential hypertension, osteoporosis, who was admitted recently for agitation and was noted to have a urinary  tract infection. Mental status improved and she was discharged back to assisted living facility. Patient was found unresponsive in had negative adequate breathing, sloughed in her wheelchair at the facility. Emesis and questionable aspiration. In the ER, she has poor pulses and bradycardic. Intubated and paced twice. EXAM: CT HEAD WITHOUT CONTRAST TECHNIQUE: Contiguous axial images were obtained from the base of the skull through the vertex without intravenous contrast. COMPARISON:  08/19/2017 FINDINGS: Brain: There is interval subacute to acute infarct involving the right occipital lobe extending into the mesial temporal lobe. A smaller subacute to acute infarct involves the left occipital lobe. There is no associated hemorrhage. No significant mass effect. There is significant central and cortical atrophy. Periventricular white matter changes are consistent with small vessel disease. Suspect small remote infarct of the right inferior cerebellum. Vascular: Significant atherosclerotic calcification of the internal carotid arteries. Skull: Normal. Negative for fracture or focal lesion. Sinuses/Orbits: Paranasal sinuses  and orbits are unremarkable. Small right mastoid effusion is present. Other: None IMPRESSION: 1. Interval development of right occipital infarct extending into to the right mesial temporal lobe. 2. Interval development of small left occipital infarct. 3. No associated hemorrhage. 4. Atrophy and small vessel disease. 5. Suspect remote infarct of the right inferior cerebellum. 6. Critical Value/emergent results were called by telephone at the time of interpretation on 09/12/2017 at 5:11 pm to [facility], the patient's provider in the ICU, who verbally acknowledged these results. Electronically Signed   By: Nolon Nations M.D.   On: 09/05/2017 17:12   Dg Chest Port 1 View  Result Date: 09/11/2017 CLINICAL DATA:  83 year old female with a history of unresponsiveness EXAM: PORTABLE CHEST 1 VIEW COMPARISON:  08/19/2017 FINDINGS: Cardiomediastinal silhouette unchanged in size and contour. Defibrillator pads on the chest wall. Endotracheal tube has been placed, which terminates at the clavicular heads, approximately 9.6 cm above the carina. No confluent airspace disease or pneumothorax. IMPRESSION: Interval placement of endotracheal tube which terminates at the clavicular heads, approximately 9.6 cm above the carina. Defibrillator pads on the chest wall. Electronically Signed   By: Corrie Mckusick D.O.   On: 08/22/2017 13:02   Dg Abd Portable 1v  Result Date: 09/14/2017 CLINICAL DATA:  OG tube placement EXAM: PORTABLE ABDOMEN - 1 VIEW COMPARISON:  None. FINDINGS: The enteric tube extends well into the stomach with tip in the region of the distal gastric body. Visible portions of the bowel gas pattern are unremarkable. IMPRESSION: Enteric tube extends well into the stomach. Electronically Signed   By: Andreas Newport M.D.   On: 08/18/2017 20:50    Patient Profile     83 y.o. female with bradycardia in setting posterior circulation strokes  Assessment & Plan   Bradycardia, resolved: tele reviewed shows sinus  rhythm with frequent premature atrial beats. Echo currently in process. On my brief review the patient's LV function appears normal. Working on palliative care consult. No further cardiac evaluation planned. Patient now on aspirin for treatment of stroke, heparin has been discontinued. Management per medicine team. Please call if we can be of any assistance.  For questions or updates, please contact [place] Please consult [email or web address] for contact info under Cardiology/STEMI.      Deatra James, MD  08/30/2017, 2:32 PM

## 2017-08-30 NOTE — Progress Notes (Signed)
ANTICOAGULATION CONSULT NOTE   Pharmacy Consult for Heparin Indication: stroke  No Known Allergies  Patient Measurements: Height:  (167.6 cm) Weight: 154 lb 12.2 oz (70.2 kg) IBW/kg (Calculated) : 59.3 Heparin Dosing Weight: 70.2 kg  Vital Signs: Temp: 99.5 F (37.5 C) (10/13 0400) Temp Source: Axillary (10/13 0400) BP: 91/48 (10/13 0500) Pulse Rate: 83 (10/13 0500)  Labs:  Recent Labs  08/24/2017 1241 08/30/2017 1740 08/30/17 0037 08/30/17 0433  HGB 14.2  --  13.0 12.4  HCT 43.9  --  39.6 37.3  PLT 217  --   --  163  LABPROT 14.0  --   --   --   INR 1.09  --   --   --   HEPARINUNFRC  --   --   --  0.23*  CREATININE 1.30*  --   --  1.23*  TROPONINI 0.08* 0.04*  --  0.04*    Estimated Creatinine Clearance: 27.9 mL/min (A) (by C-G formula based on SCr of 1.23 mg/dL (H)).   Medical History: Past Medical History:  Diagnosis Date  . Anemia    a. 12/2016 s/p PRBCs - following hip surgery.  . Dementia   . Hypercalcemia   . Hypertension   . Hypokalemia   . Orthostatic hypotension   . Osteoporosis   . PPD positive   . Pre-diabetes   . S/P right hip fracture    a. 12/2016 s/p IM Nail.    Assessment: 83 y/o F admitted with acute encephalopathy and respiratory failure s/p CVA.   Goal of Therapy:  Heparin level 0.3-0.5 units/ml Monitor platelets by anticoagulation protocol: Yes   Plan:  Start heparin infusion at 850 units/hr Check anti-Xa level in 8 hours and daily while on heparin Continue to monitor H&H and platelets  10/13 @ 0433 HL 0.23 subtherapeutic. Will rebolus w/ heparin 1050 units IV x 1 and increase rate to 1000 units/hr and will recheck HL @ 1400 w/ CBC check w/ am labs.  Thomasene Ripple, PharmD, BCPS Clinical Pharmacist 08/30/2017

## 2017-08-30 NOTE — Consult Note (Signed)
Reason for Consult:strokes  Referring Physician: Dr. Juliene Leonard  CC: strokes.   HPI: Yesenia Leonard is an 83 y.o. 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 Patient's mental status improved and was back to baseline and therefore was discharged back to her assisted living facility. At the facility she was found unresponsive, slumped in her wheelchair at the facility. With emesis and questionable aspiration. Patient in the emergency room was noted to have poor pulses and was bradycardic. She was intubated. Currently on sedation.  Past Medical History:  Diagnosis Date  . Anemia    a. 12/2016 s/p PRBCs - following hip surgery.  . Dementia   . Hypercalcemia   . Hypertension   . Hypokalemia   . Orthostatic hypotension   . Osteoporosis   . PPD positive   . Pre-diabetes   . S/P right hip fracture    a. 12/2016 s/p IM Nail.    Past Surgical History:  Procedure Laterality Date  . APPENDECTOMY    . INTRAMEDULLARY (IM) NAIL INTERTROCHANTERIC Right 12/19/2016   Procedure: INTRAMEDULLARY (IM) NAIL INTERTROCHANTRIC;  Surgeon: Yesenia Flake, MD;  Location: [facility] ORS;  Service: Orthopedics;  Laterality: Right;    Family History  Problem Relation Age of Onset  . Hypertension Mother     Social History:  reports that she has never smoked. She has never used smokeless tobacco. She reports that she does not drink alcohol or use drugs.  No Known Allergies  Medications: I have reviewed the patient's current medications.  ROS: Unable to access as not following commands, sedated and intubated.    Physical Examination: Blood pressure 127/89, pulse 100, temperature 100.1 F (37.8 C), temperature source Oral, resp. rate (!) 27, height  (1.676 m), weight 70.2 kg (154 lb 12.2 oz), SpO2 99 %.  Sedated, intubated, not following commands.   Pupils intact, corneal's  sluggish but intact.  Cough/gag present Withdrawal from painful stimuli.   Laboratory Studies:   Basic Metabolic Panel:  Recent Labs Lab 2017/09/08 1241 08/30/17 0433  NA 141 145  K 4.0 3.5  CL 102 110  CO2 28 29  GLUCOSE 242* 110*  BUN 30* 36*  CREATININE 1.30* 1.23*  CALCIUM 11.2* 9.4    Liver Function Tests:  Recent Labs Lab September 08, 2017 1241  AST 28  ALT 15  ALKPHOS 91  BILITOT 0.9  PROT 7.1  ALBUMIN 3.3*   No results for input(s): LIPASE, AMYLASE in the last 168 hours. No results for input(s): AMMONIA in the last 168 hours.  CBC:  Recent Labs Lab 2017/09/08 1241 08/30/17 0037 08/30/17 0433  WBC 14.4*  --  9.5  NEUTROABS 11.6*  --   --   HGB 14.2 13.0 12.4  HCT 43.9 39.6 37.3  MCV 92.4  --  91.2  PLT 217  --  163    Cardiac Enzymes:  Recent Labs Lab September 08, 2017 1241 08-Sep-2017 1740 08/30/17 0433  TROPONINI 0.08* 0.04* 0.04*    BNP: Invalid input(s): POCBNP  CBG:  Recent Labs Lab 09/08/2017 1718  GLUCAP 154*    Microbiology: Results for orders placed or performed during the hospital encounter of Sep 08, 2017  MRSA PCR Screening     Status: None   Collection Time: 2017-09-08  5:26 PM  Result Value Ref Range Status   MRSA by PCR NEGATIVE NEGATIVE Final    Comment:  The GeneXpert MRSA Assay (FDA approved for NASAL specimens only), is one component of a comprehensive MRSA colonization surveillance program. It is not intended to diagnose MRSA infection nor to guide or monitor treatment for MRSA infections.     Coagulation Studies:  Recent Labs  09-28-2017 1241  LABPROT 14.0  INR 1.09    Urinalysis:  Recent Labs Lab 08/30/17 0031  COLORURINE YELLOW*  LABSPEC 1.017  PHURINE 5.0  GLUCOSEU NEGATIVE  HGBUR LARGE*  BILIRUBINUR NEGATIVE  KETONESUR NEGATIVE  PROTEINUR 30*  NITRITE NEGATIVE  LEUKOCYTESUR SMALL*    Lipid Panel:     Component Value Date/Time   CHOL 171 09/28/2017 1740   TRIG 103 09-28-17 1740   HDL 41  September 28, 2017 1740   CHOLHDL 4.2 09-28-17 1740   VLDL 21 09/28/17 1740   LDLCALC 109 (H) Sep 28, 2017 1740    HgbA1C: No results found for: HGBA1C  Urine Drug Screen:     Component Value Date/Time   LABOPIA NONE DETECTED 06/12/2017 1749   COCAINSCRNUR NONE DETECTED 06/12/2017 1749   LABBENZ POSITIVE (A) 06/12/2017 1749   AMPHETMU NONE DETECTED 06/12/2017 1749   THCU NONE DETECTED 06/12/2017 1749   LABBARB NONE DETECTED 06/12/2017 1749    Alcohol Level: No results for input(s): ETH in the last 168 hours.  Other results: EKG: normal EKG, normal sinus rhythm, unchanged from previous tracings.  Imaging: Ct Head Wo Contrast  Result Date: Sep 28, 2017 CLINICAL DATA:  Yesenia Leonard is a 83 y.o. female with a known history of Dementia, essential hypertension, osteoporosis, who was admitted recently for agitation and was noted to have a urinary tract infection. Mental status improved and she was discharged back to assisted living facility. Patient was found unresponsive in had negative adequate breathing, sloughed in her wheelchair at the facility. Emesis and questionable aspiration. In the ER, she has poor pulses and bradycardic. Intubated and paced twice. EXAM: CT HEAD WITHOUT CONTRAST TECHNIQUE: Contiguous axial images were obtained from the base of the skull through the vertex without intravenous contrast. COMPARISON:  08/19/2017 FINDINGS: Brain: There is interval subacute to acute infarct involving the right occipital lobe extending into the mesial temporal lobe. A smaller subacute to acute infarct involves the left occipital lobe. There is no associated hemorrhage. No significant mass effect. There is significant central and cortical atrophy. Periventricular white matter changes are consistent with small vessel disease. Suspect small remote infarct of the right inferior cerebellum. Vascular: Significant atherosclerotic calcification of the internal carotid arteries. Skull: Normal. Negative for  fracture or focal lesion. Sinuses/Orbits: Paranasal sinuses and orbits are unremarkable. Small right mastoid effusion is present. Other: None IMPRESSION: 1. Interval development of right occipital infarct extending into to the right mesial temporal lobe. 2. Interval development of small left occipital infarct. 3. No associated hemorrhage. 4. Atrophy and small vessel disease. 5. Suspect remote infarct of the right inferior cerebellum. 6. Critical Value/emergent results were called by telephone at the time of interpretation on 2017-09-28 at 5:11 pm to [facility], the patient's provider in the ICU, who verbally acknowledged these results. Electronically Signed   By: Norva Pavlov M.D.   On: 09-28-2017 17:12   Dg Chest Port 1 View  Result Date: September 28, 2017 CLINICAL DATA:  83 year old female with a history of unresponsiveness EXAM: PORTABLE CHEST 1 VIEW COMPARISON:  08/19/2017 FINDINGS: Cardiomediastinal silhouette unchanged in size and contour. Defibrillator pads on the chest wall. Endotracheal tube has been placed, which terminates at the clavicular heads, approximately 9.6 cm above the carina. No confluent airspace  disease or pneumothorax. IMPRESSION: Interval placement of endotracheal tube which terminates at the clavicular heads, approximately 9.6 cm above the carina. Defibrillator pads on the chest wall. Electronically Signed   By: Gilmer Mor D.O.   On: 2017-09-26 13:02   Dg Abd Portable 1v  Result Date: 09-26-2017 CLINICAL DATA:  OG tube placement EXAM: PORTABLE ABDOMEN - 1 VIEW COMPARISON:  None. FINDINGS: The enteric tube extends well into the stomach with tip in the region of the distal gastric body. Visible portions of the bowel gas pattern are unremarkable. IMPRESSION: Enteric tube extends well into the stomach. Electronically Signed   By: Ellery Plunk M.D.   On: Sep 26, 2017 20:50     Assessment/Plan:   83 y.o. 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 Patient's mental status improved and was back to baseline and therefore was discharged back to her assisted living facility. At the facility she was found unresponsive, slumped in her wheelchair at the facility. With emesis and questionable aspiration. Patient in the emergency room was noted to have poor pulses and was bradycardic. She was intubated. Currently on sedation.   Pt had repeat CTH showing bilateral posterior circulation strokes.   - Likely emboli but unclear sourche - pt is DNR and warden of state, unclear how aggressive care pt would want - pt's creat is 1.23 GFR is 43. Would consider getting a CTA to look at basilar artery if radiology would approve - CTA would generally not change management as pt is poor baseline prior to admission.  Pauletta Browns     08/30/2017, 12:18 PM

## 2017-08-30 NOTE — Progress Notes (Signed)
Quiet day. Remains sedated and ventilated. Failed weaning trial this am -unable to follow commands. Restless and agitated with sedation decreased by 50%. External catheter leaking - d/ced and patient checked for incontinence q 2 hours and prn. Bath done this am. B/P cuff changed to right calf due to IV sites and swelling in right arm. Neuro consult per Tele monitor . Heparin IV d/ced due to acute stroke on CT scan. Started on aspirin per naso gastric tube. Still no spontaneous movement or withdrawing to pain with right arm. No family and no return call from legal guardian. Consult to Child psychotherapist for help with placement upon discharge.

## 2017-08-30 NOTE — Progress Notes (Signed)
1830 Patient moved fingers on right hand when turned. Only movement of right arm all day.

## 2017-08-30 NOTE — Clinical Social Work Note (Signed)
CSW received consult that the patient is from [facility] and is a ward of the state. CSW will assess when able.  Argentina Ponder, MSW, Theresia Majors [phone]

## 2017-08-30 NOTE — Progress Notes (Signed)
Pharmacy Antibiotic Note  Yesenia Leonard is a 83 y.o. female admitted on 08/31/2017 with pneumonia.  Pharmacy has been consulted for vancomycin and Zosyn dosing.  Plan: Hopefully, can d/c vancomycin with negative MRSA PCR.  Vancomycin 1000 mg iv once followed by 750 mg iv q 24 hours with stacked dosing. Will check levels/adjust dosing as necessary to target a goal trough of 15-20 mcg/ml.  Zosyn 3.375g IV q8h (4 hour infusion).   10/12 @ 1908 MRSA PCR negative -- vanc d/c'd  Height:  (167.6 cm) Weight: 154 lb 12.2 oz (70.2 kg) IBW/kg (Calculated) : 59.3  Temp (24hrs), Avg:98.8 F (37.1 C), Min:98.1 F (36.7 C), Max:99.5 F (37.5 C)   Recent Labs Lab 09/01/2017 1241 09/17/2017 1740 08/30/17 0433  WBC 14.4*  --  9.5  CREATININE 1.30*  --  1.23*  LATICACIDVEN 3.1* 3.6*  --     Estimated Creatinine Clearance: 27.9 mL/min (A) (by C-G formula based on SCr of 1.23 mg/dL (H)).    No Known Allergies  Antimicrobials this admission: 10/12 Zosyn >>  10/12 vancomycin >>   Dose adjustments this admission:   Microbiology results: UCx: sent  10/12 MRSA PCR: negative  Thank you for allowing pharmacy to be a part of this patient's care.  Thomasene Ripple 08/30/2017 6:08 AM

## 2017-08-30 NOTE — Progress Notes (Signed)
[facility] [place] at [facility]   PATIENT NAME: Yesenia Leonard    MR#:  [id number]  DATE OF BIRTH:  10/10/1924  SUBJECTIVE:   patient intubated  REVIEW OF SYSTEMS:    Intubated sedated  Tolerating Diet: npo      DRUG ALLERGIES:  No Known Allergies  VITALS:  Blood pressure 127/89, pulse 100, temperature 100.1 F (37.8 C), temperature source Oral, resp. rate (!) 27, height  (1.676 m), weight 70.2 kg (154 lb 12.2 oz), SpO2 99 %.  PHYSICAL EXAMINATION:  Constitutional: Appears well-developed and well-nourished. No distress. HENT: Normocephalic. Marland Kitchen intubated.  Eyes: Conjunctivae are normal. Sluggish pupils, no scleral icterus.  Neck: Normal ROM. Neck supple. No JVD. No tracheal deviation. CVS: RRR, S1/S2 +, no murmurs, no gallops, no carotid bruit.  Pulmonary: Effort and breath sounds normal, no stridor, rhonchi, wheezes, rales.  Abdominal: Soft. BS +,  no distension, tenderness, rebound or guarding.  Musculoskeletal: No edema and no tenderness.  Neuro: No focal deficits. Skin: Skin is warm and dry. No rash noted. Psychiatric: sedated     LABORATORY PANEL:   CBC  Recent Labs Lab 08/30/17 0433  WBC 9.5  HGB 12.4  HCT 37.3  PLT 163   ------------------------------------------------------------------------------------------------------------------  Chemistries   Recent Labs Lab 09-23-17 1241 08/30/17 0433  NA 141 145  K 4.0 3.5  CL 102 110  CO2 28 29  GLUCOSE 242* 110*  BUN 30* 36*  CREATININE 1.30* 1.23*  CALCIUM 11.2* 9.4  AST 28  --   ALT 15  --   ALKPHOS 91  --   BILITOT 0.9  --    ------------------------------------------------------------------------------------------------------------------  Cardiac Enzymes  Recent Labs Lab 09-23-2017 1241 September 23, 2017 1740 08/30/17 0433  TROPONINI 0.08* 0.04* 0.04*    ------------------------------------------------------------------------------------------------------------------  RADIOLOGY:  Ct Head Wo Contrast  Result Date: 2017-09-23 CLINICAL DATA:  Yesenia Leonard is a 83 y.o. female with a known history of Dementia, essential hypertension, osteoporosis, who was admitted recently for agitation and was noted to have a urinary tract infection. Mental status improved and she was discharged back to assisted living facility. Patient was found unresponsive in had negative adequate breathing, sloughed in her wheelchair at the facility. Emesis and questionable aspiration. In the ER, she has poor pulses and bradycardic. Intubated and paced twice. EXAM: CT HEAD WITHOUT CONTRAST TECHNIQUE: Contiguous axial images were obtained from the base of the skull through the vertex without intravenous contrast. COMPARISON:  08/19/2017 FINDINGS: Brain: There is interval subacute to acute infarct involving the right occipital lobe extending into the mesial temporal lobe. A smaller subacute to acute infarct involves the left occipital lobe. There is no associated hemorrhage. No significant mass effect. There is significant central and cortical atrophy. Periventricular white matter changes are consistent with small vessel disease. Suspect small remote infarct of the right inferior cerebellum. Vascular: Significant atherosclerotic calcification of the internal carotid arteries. Skull: Normal. Negative for fracture or focal lesion. Sinuses/Orbits: Paranasal sinuses and orbits are unremarkable. Small right mastoid effusion is present. Other: None IMPRESSION: 1. Interval development of right occipital infarct extending into to the right mesial temporal lobe. 2. Interval development of small left occipital infarct. 3. No associated hemorrhage. 4. Atrophy and small vessel disease. 5. Suspect remote infarct of the right inferior cerebellum. 6. Critical Value/emergent results were called by  telephone at the time of interpretation on 2017-09-23 at 5:11 pm to [facility], the patient's provider in the ICU, who verbally acknowledged these results. Electronically Signed  By: Norva Pavlov M.D.   On: 08/26/2017 17:12   Dg Chest Port 1 View  Result Date: 09/09/2017 CLINICAL DATA:  83 year old female with a history of unresponsiveness EXAM: PORTABLE CHEST 1 VIEW COMPARISON:  08/19/2017 FINDINGS: Cardiomediastinal silhouette unchanged in size and contour. Defibrillator pads on the chest wall. Endotracheal tube has been placed, which terminates at the clavicular heads, approximately 9.6 cm above the carina. No confluent airspace disease or pneumothorax. IMPRESSION: Interval placement of endotracheal tube which terminates at the clavicular heads, approximately 9.6 cm above the carina. Defibrillator pads on the chest wall. Electronically Signed   By: Gilmer Mor D.O.   On: 08/26/2017 13:02   Dg Abd Portable 1v  Result Date: 08/27/2017 CLINICAL DATA:  OG tube placement EXAM: PORTABLE ABDOMEN - 1 VIEW COMPARISON:  None. FINDINGS: The enteric tube extends well into the stomach with tip in the region of the distal gastric body. Visible portions of the bowel gas pattern are unremarkable. IMPRESSION: Enteric tube extends well into the stomach. Electronically Signed   By: Ellery Plunk M.D.   On: 08/31/2017 20:50     ASSESSMENT AND PLAN:   83 y/o female with dementia andHTN who was found unresponsive at facility.  1. Acute respiratory failure secondary to aspiration pneumonia  Continue vent management as per Intensivist.  2. Aspiration PNA: Continue Zosyn  3. Acute metabolic encephalopathy in the setting ofBilateral posterior circulation strokes: Consider CT to look a basal artery Likely embolic in nature Neurology consultation appreciated Follow up on echo and carotid ultrasound Continue aspirin   4. Bradycardia: Impression is currently in normal sinus rhythm Cardiology consultation  appreciated. For any recurrent junctional rhythm could consider trying atropine or dopamine  5. Elevated troponin due to demand ischemia. Patient is ruled out for non-STMI 6. Dementia  CODE STATUS: FULL  TOTAL TIME TAKING CARE OF THIS PATIENT: 30 minutes.   Needs palliative care consult  POSSIBLE D/C ??, DEPENDING ON CLINICAL CONDITION.   Karra Pink M.D on 08/30/2017 at 1:25 PM  Between 7am to 6pm - Pager - [phone] After 6pm go to [email or web address] - Social research officer, government  [facility]  Office  [phone]  CC: Primary care physician; Center, [facility]  Note: This dictation was prepared with Dragon dictation along with smaller phrase technology. Any transcriptional errors that result from this process are unintentional.

## 2017-08-31 DIAGNOSIS — J9600 Acute respiratory failure, unspecified whether with hypoxia or hypercapnia: Secondary | ICD-10-CM

## 2017-08-31 LAB — CBC
HCT: 33.9 % — ABNORMAL LOW (ref 35.0–47.0)
HEMOGLOBIN: 11.3 g/dL — AB (ref 12.0–16.0)
MCH: 30.5 pg (ref 26.0–34.0)
MCHC: 33.3 g/dL (ref 32.0–36.0)
MCV: 91.7 fL (ref 80.0–100.0)
PLATELETS: 131 10*3/uL — AB (ref 150–440)
RBC: 3.7 MIL/uL — AB (ref 3.80–5.20)
RDW: 14.7 % — ABNORMAL HIGH (ref 11.5–14.5)
WBC: 9.9 10*3/uL (ref 3.6–11.0)

## 2017-08-31 LAB — ECHOCARDIOGRAM COMPLETE
CHL CUP MV DEC (S): 148
E decel time: 148 msec
EERAT: 14.05
FS: 33 % (ref 28–44)
HEIGHTINCHES: 66 in
IVS/LV PW RATIO, ED: 0.9
LA ID, A-P, ES: 46 mm
LA diam end sys: 46 mm
LA diam index: 2.53 cm/m2
LAVOL: 69.7 mL
LAVOLA4C: 68.4 mL
LAVOLIN: 38.3 mL/m2
LV PW d: 12.4 mm — AB (ref 0.6–1.1)
LV TDI E'LATERAL: 5.66
LV e' LATERAL: 5.66 cm/s
LVEEAVG: 14.05
LVEEMED: 14.05
Lateral S' vel: 16 cm/s
MV Peak grad: 3 mmHg
MVAP: 5.12 cm2
MVPKAVEL: 118 m/s
MVPKEVEL: 79.5 m/s
P 1/2 time: 43 ms
TAPSE: 30.7 mm
TDI e' medial: 5.87
Weight: 2476.21 oz

## 2017-08-31 LAB — MAGNESIUM
Magnesium: 1.6 mg/dL — ABNORMAL LOW (ref 1.7–2.4)
Magnesium: 1.7 mg/dL (ref 1.7–2.4)

## 2017-08-31 LAB — PHOSPHORUS
PHOSPHORUS: 2.2 mg/dL — AB (ref 2.5–4.6)
Phosphorus: 2 mg/dL — ABNORMAL LOW (ref 2.5–4.6)

## 2017-08-31 LAB — GLUCOSE, CAPILLARY
GLUCOSE-CAPILLARY: 87 mg/dL (ref 65–99)
GLUCOSE-CAPILLARY: 89 mg/dL (ref 65–99)
GLUCOSE-CAPILLARY: 89 mg/dL (ref 65–99)

## 2017-08-31 MED ORDER — VITAL HIGH PROTEIN PO LIQD: 1000.0000 mL | ORAL | Status: DC

## 2017-08-31 MED ORDER — VITAL AF 1.2 CAL PO LIQD
1000.0000 mL | ORAL | Status: DC
  Administered 2017-08-31: 1000 mL

## 2017-08-31 NOTE — Progress Notes (Signed)
[facility] - [place] at [facility]   PATIENT NAME: Yesenia Leonard    MR#:  [id number]  DATE OF BIRTH:  Apr 22, 1924  SUBJECTIVE:   patient intubated She is a warden of the state   REVIEW OF SYSTEMS:    Intubated sedated  Tolerating Diet: npo      DRUG ALLERGIES:  No Known Allergies  VITALS:  Blood pressure (!) 117/53, pulse 79, temperature 98.9 F (37.2 C), temperature source Axillary, resp. rate (!) 21, height  (1.676 m), weight 70.2 kg (154 lb 12.2 oz), SpO2 100 %.  PHYSICAL EXAMINATION:  Constitutional: Appears well-developed and well-nourished. No distress. HENT: Normocephalic. Marland Kitchen intubated.  Eyes: Conjunctivae are normal. Sluggish pupils, no scleral icterus.  Neck: Normal ROM. Neck supple. No JVD. No tracheal deviation. CVS: RRR, S1/S2 +, no murmurs, no gallops, no carotid bruit.  Pulmonary: Effort and breath sounds normal, no stridor, rhonchi, wheezes, rales.  Abdominal: Soft. BS +,  no distension, tenderness, rebound or guarding.  Musculoskeletal: No edema and no tenderness.  Neuro: No focal deficits. Skin: Skin is warm and dry. No rash noted. Psychiatric: sedated     LABORATORY PANEL:   CBC  Recent Labs Lab 08/31/17 0419  WBC 9.9  HGB 11.3*  HCT 33.9*  PLT 131*   ------------------------------------------------------------------------------------------------------------------  Chemistries   Recent Labs Lab 09-07-2017 1241 08/30/17 0433  NA 141 145  K 4.0 3.5  CL 102 110  CO2 28 29  GLUCOSE 242* 110*  BUN 30* 36*  CREATININE 1.30* 1.23*  CALCIUM 11.2* 9.4  AST 28  --   ALT 15  --   ALKPHOS 91  --   BILITOT 0.9  --    ------------------------------------------------------------------------------------------------------------------  Cardiac Enzymes  Recent Labs Lab 2017-09-07 1241 09-07-2017 1740 08/30/17 0433  TROPONINI 0.08* 0.04* 0.04*    ------------------------------------------------------------------------------------------------------------------  RADIOLOGY:  Ct Head Wo Contrast  Result Date: September 07, 2017 CLINICAL DATA:  Yesenia Leonard is a 83 y.o. female with a known history of Dementia, essential hypertension, osteoporosis, who was admitted recently for agitation and was noted to have a urinary tract infection. Mental status improved and she was discharged back to assisted living facility. Patient was found unresponsive in had negative adequate breathing, sloughed in her wheelchair at the facility. Emesis and questionable aspiration. In the ER, she has poor pulses and bradycardic. Intubated and paced twice. EXAM: CT HEAD WITHOUT CONTRAST TECHNIQUE: Contiguous axial images were obtained from the base of the skull through the vertex without intravenous contrast. COMPARISON:  08/19/2017 FINDINGS: Brain: There is interval subacute to acute infarct involving the right occipital lobe extending into the mesial temporal lobe. A smaller subacute to acute infarct involves the left occipital lobe. There is no associated hemorrhage. No significant mass effect. There is significant central and cortical atrophy. Periventricular white matter changes are consistent with small vessel disease. Suspect small remote infarct of the right inferior cerebellum. Vascular: Significant atherosclerotic calcification of the internal carotid arteries. Skull: Normal. Negative for fracture or focal lesion. Sinuses/Orbits: Paranasal sinuses and orbits are unremarkable. Small right mastoid effusion is present. Other: None IMPRESSION: 1. Interval development of right occipital infarct extending into to the right mesial temporal lobe. 2. Interval development of small left occipital infarct. 3. No associated hemorrhage. 4. Atrophy and small vessel disease. 5. Suspect remote infarct of the right inferior cerebellum. 6. Critical Value/emergent results were called by  telephone at the time of interpretation on 09-07-2017 at 5:11 pm to [facility], the patient's provider in the ICU,  who verbally acknowledged these results. Electronically Signed   By: Norva Pavlov M.D.   On: 09/04/2017 17:12   Dg Chest Port 1 View  Result Date: 08/26/2017 CLINICAL DATA:  83 year old female with a history of unresponsiveness EXAM: PORTABLE CHEST 1 VIEW COMPARISON:  08/19/2017 FINDINGS: Cardiomediastinal silhouette unchanged in size and contour. Defibrillator pads on the chest wall. Endotracheal tube has been placed, which terminates at the clavicular heads, approximately 9.6 cm above the carina. No confluent airspace disease or pneumothorax. IMPRESSION: Interval placement of endotracheal tube which terminates at the clavicular heads, approximately 9.6 cm above the carina. Defibrillator pads on the chest wall. Electronically Signed   By: Gilmer Mor D.O.   On: 08/23/2017 13:02   Dg Abd Portable 1v  Result Date: 08/26/2017 CLINICAL DATA:  OG tube placement EXAM: PORTABLE ABDOMEN - 1 VIEW COMPARISON:  None. FINDINGS: The enteric tube extends well into the stomach with tip in the region of the distal gastric body. Visible portions of the bowel gas pattern are unremarkable. IMPRESSION: Enteric tube extends well into the stomach. Electronically Signed   By: Ellery Plunk M.D.   On: 08/23/2017 20:50     ASSESSMENT AND PLAN:   83 y/o female with dementia andHTN who was found unresponsive at facility.  1. Acute respiratory failure secondary to aspiration pneumonia  Continue vent management as per Intensivist. On fentanyl and versed for sedation   2. Aspiration PNA: Continue Zosyn  3. Acute metabolic encephalopathy in the setting ofBilateral posterior circulation strokes: Consider CT to look a basal artery Likely embolic in nature Neurology consultation appreciated Follow up on echo and carotid ultrasound Continue aspirin   4. Bradycardia: SHe is currently in normal sinus  rhythm Cardiology consultation appreciated.  5. Elevated troponin due to demand ischemia. Patient is ruled out for non-STMI 6. Dementia  CODE STATUS: FULL  TOTAL TIME TAKING CARE OF THIS PATIENT: 27 minutes.   Needs palliative care consult  POSSIBLE D/C ??, DEPENDING ON CLINICAL CONDITION.   Elery Cadenhead M.D on 08/31/2017 at 11:22 AM  Between 7am to 6pm - Pager - [phone] After 6pm go to [email or web address] - Social research officer, government  [facility]  Office  [phone]  CC: Primary care physician; Center, [facility]  Note: This dictation was prepared with Dragon dictation along with smaller phrase technology. Any transcriptional errors that result from this process are unintentional.

## 2017-08-31 NOTE — Progress Notes (Signed)
Patient has been maintaining sats this shift. HR and BP within normal range. No change in neuro status since admission. Stays in fetal position with hands folded. Low grade temp x one. Incontinent of urine. Continue on NS maintenance fluid, fentanyl and Versed.

## 2017-08-31 NOTE — Clinical Social Work Note (Signed)
CSW has attempted to contact the patient's DSS guardian. The CSW left a HIPPA compliant voicemail to update the guardian as to the situation with the patient. The CSW will continue to follow to facilitate discharge or assist with goals of care.  Argentina Ponder, MSW, Theresia Majors [phone]

## 2017-08-31 NOTE — Progress Notes (Signed)
Initial Nutrition Assessment  DOCUMENTATION CODES:   Not applicable  INTERVENTION:  Recommend providing Vital AF 1.2 at 50 ml/hr (1200 ml goal daily volume) via OGT. Provides 1440 kcal, 90 grams of protein, 972 ml H2O.  Recommend providing minimum free water flush on pump of 30 ml Q4hrs as patient is receiving NS at 100 ml/hr.  NUTRITION DIAGNOSIS:   Inadequate oral intake related to inability to eat as evidenced by NPO status.  GOAL:   Provide needs based on ASPEN/SCCM guidelines  MONITOR:   Vent status, Labs, Weight trends, TF tolerance, Skin, I & O's  REASON FOR ASSESSMENT:   Ventilator    ASSESSMENT:   83 year old female with PMHx of dementia, HTN, osteoporosis, pre-diabetes, hypokalemia, hypercalcemia, PPD positive, hx of right hip fracture s/p IM nail 12/19/2016 who presented from [facility] after being found unresponsive in dining room, admitted with acute respiratory failure likely secondary to aspiration PNA and was intubated 10/12 for airway protection.   No family members or caregivers at bedside. Per review of weight history in chart patient has been 124-130 lbs this past year and was up to 147 lbs on 08/22/2017. Will continue to monitor weight trend.  Access: OGT placed 10/12; terminates in stomach per abdominal x-ray 10/12, 60 cm at corner of mouth  MAP: 64-90 mmHg  Patient is currently intubated on ventilator support MV: 6.8 L/min Temp (24hrs), Avg:98.8 F (37.1 C), Min:98.2 F (36.8 C), Max:99.7 F (37.6 C)  Propofol: N/A  Medications reviewed and include: NS @ 100 ml/hr, fentanyl gtt, Zosyn.  Labs reviewed: BUN 36, Creatinine 1.23, elevated Troponin.  Nutrition-Focused physical exam completed. Findings are no fat depletion, no muscle depletion, and mild pitting edema to bilateral lower extremities.   Patient does not meet criteria for malnutrition at this time.  Discussed with RN.  Diet Order:  Diet NPO time specified  Skin:  Wound (see  comment) (weeping old wound top of left foot)  Last BM:  08/31/2017 - small black type 6  Height:   Ht Readings from Last 1 Encounters:  09/15/2017  (1.676 m)    Weight:   Wt Readings from Last 1 Encounters:  09/11/2017 154 lb 12.2 oz (70.2 kg)    Ideal Body Weight:  59.1 kg  BMI:  Body mass index is 24.98 kg/m.  Estimated Nutritional Needs:   Kcal:  1372 (PSU 2003b w/ MSJ 1140, Ve 6.8, Tmax 37.6)  Protein:  84-98 grams (1.2-1.4 grams/kg)  Fluid:  1.7 L/day (25 ml/kg)  EDUCATION NEEDS:   No education needs identified at this time  Helane Rima, MS, RD, LDN Office: [phone] Pager: [phone] After Hours/Weekend Pager: [phone]

## 2017-09-01 DIAGNOSIS — Z515 Encounter for palliative care: Secondary | ICD-10-CM

## 2017-09-01 DIAGNOSIS — T17908A Unspecified foreign body in respiratory tract, part unspecified causing other injury, initial encounter: Secondary | ICD-10-CM

## 2017-09-01 DIAGNOSIS — Z7189 Other specified counseling: Secondary | ICD-10-CM

## 2017-09-01 LAB — COMPREHENSIVE METABOLIC PANEL
ALT: 19 U/L (ref 14–54)
ANION GAP: 8 (ref 5–15)
AST: 36 U/L (ref 15–41)
Albumin: 2.4 g/dL — ABNORMAL LOW (ref 3.5–5.0)
Alkaline Phosphatase: 58 U/L (ref 38–126)
BILIRUBIN TOTAL: 0.6 mg/dL (ref 0.3–1.2)
BUN: 22 mg/dL — AB (ref 6–20)
CHLORIDE: 114 mmol/L — AB (ref 101–111)
CO2: 25 mmol/L (ref 22–32)
Calcium: 9 mg/dL (ref 8.9–10.3)
Creatinine, Ser: 0.82 mg/dL (ref 0.44–1.00)
GFR calc Af Amer: >60 mL/min (ref >60)
Glucose, Bld: 142 mg/dL — ABNORMAL HIGH (ref 65–99)
POTASSIUM: 3.5 mmol/L (ref 3.5–5.1)
Sodium: 147 mmol/L — ABNORMAL HIGH (ref 135–145)
TOTAL PROTEIN: 5.6 g/dL — AB (ref 6.5–8.1)

## 2017-09-01 LAB — PROTIME-INR
INR: 1.18
Prothrombin Time: 14.9 seconds (ref 11.4–15.2)

## 2017-09-01 LAB — PROCALCITONIN: Procalcitonin: 0.26 ng/mL

## 2017-09-01 LAB — CBC
HEMATOCRIT: 33.8 % — AB (ref 35.0–47.0)
HEMOGLOBIN: 11.1 g/dL — AB (ref 12.0–16.0)
MCH: 30.7 pg (ref 26.0–34.0)
MCHC: 32.8 g/dL (ref 32.0–36.0)
MCV: 93.7 fL (ref 80.0–100.0)
Platelets: 127 10*3/uL — ABNORMAL LOW (ref 150–440)
RBC: 3.61 MIL/uL — AB (ref 3.80–5.20)
RDW: 14.8 % — AB (ref 11.5–14.5)
WBC: 10.9 10*3/uL (ref 3.6–11.0)

## 2017-09-01 LAB — PHOSPHORUS: PHOSPHORUS: 2.1 mg/dL — AB (ref 2.5–4.6)

## 2017-09-01 LAB — GLUCOSE, CAPILLARY
GLUCOSE-CAPILLARY: 131 mg/dL — AB (ref 65–99)
GLUCOSE-CAPILLARY: 166 mg/dL — AB (ref 65–99)
Glucose-Capillary: 119 mg/dL — ABNORMAL HIGH (ref 65–99)
Glucose-Capillary: 147 mg/dL — ABNORMAL HIGH (ref 65–99)

## 2017-09-01 LAB — LACTIC ACID, PLASMA: LACTIC ACID, VENOUS: 1.1 mmol/L (ref 0.5–1.9)

## 2017-09-01 LAB — MAGNESIUM: Magnesium: 1.5 mg/dL — ABNORMAL LOW (ref 1.7–2.4)

## 2017-09-01 MED ORDER — POLYVINYL ALCOHOL 1.4 % OP SOLN
1.0000 [drp] | Freq: Four times a day (QID) | OPHTHALMIC | Status: DC | PRN
  Filled 2017-09-01: qty 15

## 2017-09-01 MED ORDER — MORPHINE BOLUS VIA INFUSION: 2.0000 mg | INTRAVENOUS | Status: DC | PRN

## 2017-09-01 MED ORDER — HALOPERIDOL LACTATE 2 MG/ML PO CONC
0.5000 mg | ORAL | Status: DC | PRN
  Filled 2017-09-01: qty 0.3

## 2017-09-01 MED ORDER — GLYCOPYRROLATE 0.2 MG/ML IJ SOLN: 0.2000 mg | INTRAMUSCULAR | Status: DC | PRN

## 2017-09-01 MED ORDER — BIOTENE DRY MOUTH MT LIQD: 15.0000 mL | OROMUCOSAL | Status: DC | PRN

## 2017-09-01 MED ORDER — NOREPINEPHRINE BITARTRATE 1 MG/ML IV SOLN: 0.0000 ug/min | INTRAVENOUS | Status: DC

## 2017-09-01 MED ORDER — LORAZEPAM 1 MG PO TABS: 1.0000 mg | ORAL_TABLET | ORAL | Status: DC | PRN

## 2017-09-01 MED ORDER — LORAZEPAM 2 MG/ML IJ SOLN: 1.0000 mg | INTRAMUSCULAR | Status: DC | PRN

## 2017-09-01 MED ORDER — POTASSIUM PHOSPHATES 15 MMOLE/5ML IV SOLN
30.0000 mmol | Freq: Once | INTRAVENOUS | Status: AC
  Administered 2017-09-01: 30 mmol via INTRAVENOUS
  Filled 2017-09-01: qty 10

## 2017-09-01 MED ORDER — SODIUM CHLORIDE 0.9% FLUSH: 3.0000 mL | INTRAVENOUS | Status: DC | PRN

## 2017-09-01 MED ORDER — MAGNESIUM SULFATE 2 GM/50ML IV SOLN
2.0000 g | Freq: Once | INTRAVENOUS | Status: AC
  Administered 2017-09-01: 2 g via INTRAVENOUS
  Filled 2017-09-01: qty 50

## 2017-09-01 MED ORDER — SODIUM CHLORIDE 0.9% FLUSH
3.0000 mL | Freq: Two times a day (BID) | INTRAVENOUS | Status: DC
  Administered 2017-09-01: 3 mL via INTRAVENOUS

## 2017-09-01 MED ORDER — LORAZEPAM 2 MG/ML PO CONC: 1.0000 mg | ORAL | Status: DC | PRN

## 2017-09-01 MED ORDER — HALOPERIDOL LACTATE 5 MG/ML IJ SOLN: 0.5000 mg | INTRAMUSCULAR | Status: DC | PRN

## 2017-09-01 MED ORDER — GLYCOPYRROLATE 1 MG PO TABS: 1.0000 mg | ORAL_TABLET | ORAL | Status: DC | PRN

## 2017-09-01 MED ORDER — GLYCOPYRROLATE 0.2 MG/ML IJ SOLN
0.4000 mg | Freq: Once | INTRAMUSCULAR | Status: AC
  Administered 2017-09-01: 0.4 mg via INTRAVENOUS
  Filled 2017-09-01: qty 2

## 2017-09-01 MED ORDER — PHENYLEPHRINE HCL 10 MG/ML IJ SOLN
0.0000 ug/min | Freq: Once | INTRAVENOUS | Status: DC
  Filled 2017-09-01: qty 1

## 2017-09-01 MED ORDER — SODIUM CHLORIDE 0.9 % IV SOLN: 2.0000 mg/h | INTRAVENOUS | Status: DC

## 2017-09-01 MED ORDER — FENTANYL BOLUS VIA INFUSION
50.0000 ug | INTRAVENOUS | Status: DC | PRN
  Filled 2017-09-01: qty 100

## 2017-09-01 MED ORDER — SODIUM CHLORIDE 0.9 % IV SOLN
0.0000 ug/min | INTRAVENOUS | Status: DC
  Administered 2017-09-01 (×2): 20 ug/min via INTRAVENOUS
  Filled 2017-09-01: qty 1

## 2017-09-01 MED ORDER — SODIUM CHLORIDE 0.9 % IV BOLUS (SEPSIS)
1000.0000 mL | Freq: Once | INTRAVENOUS | Status: AC
  Administered 2017-09-01: 1000 mL via INTRAVENOUS

## 2017-09-01 MED ORDER — MORPHINE SULFATE (PF) 2 MG/ML IV SOLN
2.0000 mg | INTRAVENOUS | Status: DC | PRN
  Administered 2017-09-01 – 2017-09-02 (×3): 2 mg via INTRAVENOUS
  Filled 2017-09-01 (×3): qty 1

## 2017-09-01 MED ORDER — SODIUM CHLORIDE 0.9 % IV SOLN
250.0000 mL | INTRAVENOUS | Status: DC | PRN
Start: 2017-09-01 — End: 2017-09-02

## 2017-09-01 MED ORDER — HALOPERIDOL 0.5 MG PO TABS
0.5000 mg | ORAL_TABLET | ORAL | Status: DC | PRN
  Filled 2017-09-01: qty 1

## 2017-09-01 MED ORDER — SODIUM CHLORIDE 0.9 % IV BOLUS (SEPSIS): 1000.0000 mL | Freq: Once | INTRAVENOUS | Status: DC

## 2017-09-01 NOTE — Progress Notes (Signed)
[facility] [place] NAME: Yesenia Leonard    MR#:  [id number]  DATE OF BIRTH:  1924-02-13  SUBJECTIVE:   patient intubated weaning off of sedation this am  REVIEW OF SYSTEMS:    Intubated sedated  Tolerating Diet: npo      DRUG ALLERGIES:  No Known Allergies  VITALS:  Blood pressure (!) 205/127, pulse (!) 125, temperature 99.1 F (37.3 C), resp. rate 20, height 5' 6"  (1.676 m), weight 76.5 kg (168 lb 10.4 oz), SpO2 96 %.  PHYSICAL EXAMINATION:  Constitutional: Appears well-developed and well-nourished. No distress. HENT: Normocephalic. Marland Kitchen intubated.  Eyes: Conjunctivae are normal. Sluggish pupils, no scleral icterus.  Neck: Normal ROM. Neck supple. No JVD. No tracheal deviation. CVS: RRR, S1/S2 +, no murmurs, no gallops, no carotid bruit.  Pulmonary: Effort and breath sounds normal, no stridor, rhonchi, wheezes, rales.  Abdominal: Soft. BS +,  no distension, tenderness, rebound or guarding.  Musculoskeletal: No edema and no tenderness.  Neuro: No focal deficits. Skin: Skin is warm and dry. No rash noted. Psychiatric: sedated     LABORATORY PANEL:   CBC  Recent Labs Lab 09/01/17 0505  WBC 10.9  HGB 11.1*  HCT 33.8*  PLT 127*   ------------------------------------------------------------------------------------------------------------------  Chemistries   Recent Labs Lab 09/01/17 0505  NA 147*  K 3.5  CL 114*  CO2 25  GLUCOSE 142*  BUN 22*  CREATININE 0.82  CALCIUM 9.0  MG 1.5*  AST 36  ALT 19  ALKPHOS 58  BILITOT 0.6   ------------------------------------------------------------------------------------------------------------------  Cardiac Enzymes  Recent Labs Lab 09/04/2017 1241 09/04/2017 1740 08/30/17 0433  TROPONINI 0.08* 0.04* 0.04*   ------------------------------------------------------------------------------------------------------------------  RADIOLOGY:  No results  found.   ASSESSMENT AND PLAN:   83 y/o female with dementia andHTN who was found unresponsive at facility.  1. Acute respiratory failure secondary to aspiration pneumonia  Plan to wean off ofventilator today.  2. Aspiration PNA: Continue Zosyn  3. Acute metabolic encephalopathy in the setting of Bilateral posterior circulation strokes: Consider CT to look a basal artery Likely embolic in nature Neurology consultation appreciated Echo shows normal ejection fraction without source of thrombi. It does show stage I diastolic dysfunction. Carotid ultrasound unable to be obtained as patient is currently on the ventilator. This can be planned and later date Continue aspirin   4. Bradycardia: SHe is currently in normal sinus rhythm Cardiology consultation appreciated.  5. Elevated troponin due to demand ischemia. Patient is ruled out for non-STMI 6. Dementia  CODE STATUS: FULL  TOTAL TIME TAKING CARE OF THIS PATIENT: 27 minutes.   Palliative care team met with legal guardian Most form and DO NOT RESUSCITATE were completed for DSS and the courts consideration.  POSSIBLE D/C ??, DEPENDING ON CLINICAL CONDITION. Discussed with Dr. Alva Garnet  Tye Juarez M.D on 09/01/2017 at 10:52 AM  Between 7am to 6pm - Pager - [phone] After 6pm go to [email or web address] - password [email or web address] [facility]  Office  [phone]  CC: Primary care physician; [facility]  Note: This dictation was prepared with Dragon dictation along with smaller phrase technology. Any transcriptional errors that result from this process are unintentional.

## 2017-09-01 NOTE — Progress Notes (Signed)
Patient placed into 5/5 and 30% by Myra RN

## 2017-09-01 NOTE — Progress Notes (Signed)
No charge note.  Called by RN.  Patient extubated but not doing well.   Have shifted over to full comfort.   Comfort orders placed.  I spoke with family at bedside.  Chaplain called.  Norvel Richards, PA-C Palliative Medicine Pager: [phone]

## 2017-09-01 NOTE — Progress Notes (Signed)
  [facility] Critical Care Medicine Progess Note      ASSESSMENT/PLAN  Acute respiratory failure Acute CVA Suspected aspiration pneumonia Acute kidney injury Acute kidney injury Plan  ill continue full ventilator support at this time. Weaning as tolerated. On antibiotics. Minimize sedating medications. Monitor mental status closely. On aspirin. Monitor urine output and creatinine closely. We'll repeat labs in a.m. DVT/GI prophylaxis. Will need palliative care input.  Cc: 32 minutes  Dorothyann Gibbs MD PCCM   SUBJECTIVE:  Patient seen and examined at bedside. No acute events noted overnight. Patient on aspirin. Neurology recommendations noted. Patient minimally responsive.   VITAL SIGNS: Reviewed  PHYSICAL EXAMINATION: Physical Examination:   VS: BP (!) 198/84   Pulse (!) 103   Temp 99.1 F (37.3 C)   Resp (!) 23   Ht  (1.676 m)   Wt 168 lb 10.4 oz (76.5 kg)   SpO2 (!) 84%   BMI 27.22 kg/m    General Appearance: intubated Neuro: does not follow commands, winces to painful stimuli HEENT: PERRLA, EOM intact. Pulmonary: normal breath sounds   Cardiovascular: Normal S1,S2.  No m/r/g.   Abdomen: Benign, Soft, non-tender.   LABORATORY PANEL:  Reviewed  RADIOLOGY:  No results found.    Dorothyann Gibbs MD
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
## 2017-09-01 NOTE — Progress Notes (Signed)
Pt beginning to show signs of air hunger, NP made aware, order for PRN morphine obtained, administered. Call received from Legal guardian, updated. [facility] called for update but writing RN informed facility, not able to give out information without password. Informed they may contact legal guardian for update if needed.

## 2017-09-01 NOTE — Progress Notes (Signed)
Pt. Was extubated per Dr. Sung Amabile orders. This is a one way extubation.

## 2017-09-01 NOTE — Progress Notes (Signed)
Terminally extubated around 1430 today. Remains on Fentanyl at 200 per comfort care order. O2 sats remain in the 70s to 80s. Family in and out.

## 2017-09-01 NOTE — Progress Notes (Signed)
Patient has been extubated terminally and is now comfort measures only.  Billy Fischer, MD PCCM service Mobile [phone] Pager [phone] 09/01/2017 6:16 PM

## 2017-09-01 NOTE — Progress Notes (Signed)
Pt remains on comfort care with fentanyl gtt. Family at bedside. Continually evaluating comfort. Will continue to monitor and update family.

## 2017-09-02 LAB — URINE CULTURE
Culture: 40000 — AB
Culture: 10000 — AB

## 2017-09-05 ENCOUNTER — Telehealth: Payer: Self-pay | Admitting: Pulmonary Disease

## 2017-09-05 NOTE — Telephone Encounter (Signed)
Placed on DS' desk for him to sign.

## 2017-09-05 NOTE — Telephone Encounter (Signed)
Informed [employer] death cert is ready for pick up. Placed up front for pick up.

## 2017-09-05 NOTE — Telephone Encounter (Signed)
[facility] dropped off Death Certificate to be signed Placed in Nurse Box

## 2017-09-06 LAB — CULTURE, BLOOD (ROUTINE X 2)
CULTURE: NO GROWTH
Culture: NO GROWTH
SPECIAL REQUESTS: ADEQUATE
Special Requests: ADEQUATE

## 2017-09-18 NOTE — Progress Notes (Signed)
Palliative Medicine RN Note: Rec'd call at 0830 on 10/16 from sister [place]. They want to use [employer] for funeral services [phone]). Called to [facility] @ [facility].   Margret Chance Mylie Mccurley, RN, BSN, [facility] 09-Sep-2017 8:51 AM Cell [phone] 8:00-4:00 Monday-Friday Office [phone]

## 2017-09-18 NOTE — Death Summary Note (Signed)
DEATH SUMMARY   Patient Details  Name: Yesenia Leonard MRN: [id number] DOB: Jul 03, 1924  Admission/Discharge Information   Admit Date:  09-23-2017  Date of Death:  2017-09-27  Time of Death:  01:51 am   Length of Stay: 4  Referring Physician: [facility]   Reason(s) for Hospitalization  Acute Encephalopathy  Diagnoses  Preliminary cause of death: Stroke [facility]) Secondary Diagnoses (including complications and co-morbidities):  Active Problems:   Acute respiratory failure [facility])   Brief Hospital Course (including significant findings, care, treatment, and services provided and events leading to death)  [name] is a 83 y.o. year old female  with a PMH of Pre-diabetes, PPD positive, Osteoporosis, Hypokalemia, Hypercalcemia, HTN, and Dementia.  She presented to [facility] ER 10/12 via EMS from [facility] after being found in the dining room unresponsive with inadequate respirations with large amount of emesis likely aspirated.  Upon EMS arrival her heart rate was in the 40's with a thready pulse requiring cardiac pacing, therefore EMS intubated the patient at the scene.  In the ER she was in NSR and no longer required pacing.  Lab results revealed creatinine 1.30, calcium 11.2, troponin 0.08, and wbc 14.4.  A CT Head on 23-Sep-2017 revealed a right occipital infarct extending into the right mesial temporal lobe with high suspicion of a remote infarct of the right inferior cerebellum.  On 10/15 a one way extubation was performed after goals of treatment discussed with pts family with the decision to transition to comfort measures only if the pt declined post extubation.  Following extubation the pt declined requiring transition to comfort measures only, she expired on 09-27-17 at 01:51 am with family member at bedside.     Pertinent Labs and Studies  Significant Diagnostic Studies Ct Head Wo Contrast  Result Date: 09/23/2017 CLINICAL DATA:  Yesenia Leonard is a 83 y.o.  female with a known history of Dementia, essential hypertension, osteoporosis, who was admitted recently for agitation and was noted to have a urinary tract infection. Mental status improved and she was discharged back to assisted living facility. Patient was found unresponsive in had negative adequate breathing, sloughed in her wheelchair at the facility. Emesis and questionable aspiration. In the ER, she has poor pulses and bradycardic. Intubated and paced twice. EXAM: CT HEAD WITHOUT CONTRAST TECHNIQUE: Contiguous axial images were obtained from the base of the skull through the vertex without intravenous contrast. COMPARISON:  08/19/2017 FINDINGS: Brain: There is interval subacute to acute infarct involving the right occipital lobe extending into the mesial temporal lobe. A smaller subacute to acute infarct involves the left occipital lobe. There is no associated hemorrhage. No significant mass effect. There is significant central and cortical atrophy. Periventricular white matter changes are consistent with small vessel disease. Suspect small remote infarct of the right inferior cerebellum. Vascular: Significant atherosclerotic calcification of the internal carotid arteries. Skull: Normal. Negative for fracture or focal lesion. Sinuses/Orbits: Paranasal sinuses and orbits are unremarkable. Small right mastoid effusion is present. Other: None IMPRESSION: 1. Interval development of right occipital infarct extending into to the right mesial temporal lobe. 2. Interval development of small left occipital infarct. 3. No associated hemorrhage. 4. Atrophy and small vessel disease. 5. Suspect remote infarct of the right inferior cerebellum. 6. Critical Value/emergent results were called by telephone at the time of interpretation on 09/23/2017 at 5:11 pm to [facility], the patient's provider in the ICU, who verbally acknowledged these results. Electronically Signed   By: Norva Pavlov M.D.  On: 2017/09/23 17:12   Ct Head  Wo Contrast  Result Date: 08/19/2017 CLINICAL DATA:  Altered level of consciousness. Patient fell earlier tonight. EXAM: CT HEAD WITHOUT CONTRAST TECHNIQUE: Contiguous axial images were obtained from the base of the skull through the vertex without intravenous contrast. COMPARISON:  06/18/2017 FINDINGS: Brain: Diffuse cerebral atrophy. Ventricular dilatation consistent with central atrophy. Low-attenuation changes in the deep white matter consistent with small vessel ischemia. No mass effect or midline shift. No abnormal extra-axial fluid collections. Gray-white matter junctions are distinct. Basal cisterns are not effaced. No acute intracranial hemorrhage. Vascular: Vascular calcifications are present in the internal carotid arteries. Skull: Calvarium appears intact. Sinuses/Orbits: Paranasal sinuses are clear. Partial opacification of the right mastoid air cells. Other: No significant changes since previous study. IMPRESSION: No acute intracranial abnormalities. Chronic atrophy and small vessel ischemic changes. Right mastoid effusion. Electronically Signed   By: Burman Nieves M.D.   On: 08/19/2017 04:21   Dg Chest Port 1 View  Result Date: 09/23/17 CLINICAL DATA:  83 year old female with a history of unresponsiveness EXAM: PORTABLE CHEST 1 VIEW COMPARISON:  08/19/2017 FINDINGS: Cardiomediastinal silhouette unchanged in size and contour. Defibrillator pads on the chest wall. Endotracheal tube has been placed, which terminates at the clavicular heads, approximately 9.6 cm above the carina. No confluent airspace disease or pneumothorax. IMPRESSION: Interval placement of endotracheal tube which terminates at the clavicular heads, approximately 9.6 cm above the carina. Defibrillator pads on the chest wall. Electronically Signed   By: Gilmer Mor D.O.   On: 23-Sep-2017 13:02   Dg Chest Port 1 View  Result Date: 08/19/2017 CLINICAL DATA:  Altered mental status. Patient fell earlier in the night. EXAM:  PORTABLE CHEST 1 VIEW COMPARISON:  06/14/2017 FINDINGS: Shallow inspiration. Heart size and pulmonary vascularity are normal. Linear scarring in the left lung base is unchanged since prior study. No airspace disease or consolidation in the lungs. No pneumothorax. No pleural effusions. Calcification of the aorta. Degenerative changes in the spine and shoulders. IMPRESSION: No evidence of active pulmonary disease.  Aortic atherosclerosis. Electronically Signed   By: Burman Nieves M.D.   On: 08/19/2017 03:33   Dg Abd Portable 1v  Result Date: 2017/09/23 CLINICAL DATA:  OG tube placement EXAM: PORTABLE ABDOMEN - 1 VIEW COMPARISON:  None. FINDINGS: The enteric tube extends well into the stomach with tip in the region of the distal gastric body. Visible portions of the bowel gas pattern are unremarkable. IMPRESSION: Enteric tube extends well into the stomach. Electronically Signed   By: Ellery Plunk M.D.   On: 09-23-2017 20:50    Microbiology Recent Results (from the past 240 hour(s))  MRSA PCR Screening     Status: None   Collection Time: 09-23-2017  5:26 PM  Result Value Ref Range Status   MRSA by PCR NEGATIVE NEGATIVE Final    Comment:        The GeneXpert MRSA Assay (FDA approved for NASAL specimens only), is one component of a comprehensive MRSA colonization surveillance program. It is not intended to diagnose MRSA infection nor to guide or monitor treatment for MRSA infections.   Urine Culture     Status: Abnormal (Preliminary result)   Collection Time: 08/30/17 12:31 AM  Result Value Ref Range Status   Specimen Description URINE, RANDOM  Final   Special Requests NONE  Final   Culture (A)  Final    40,000 COLONIES/mL ENTEROCOCCUS FAECIUM REPEATING SENSITIVITIES Performed at [facility], [address], [place],  [place] [address]    Report Status PENDING  Incomplete  Culture, blood (Routine X 2) w Reflex to ID Panel     Status: None (Preliminary result)   Collection  Time: 09/01/17  5:06 AM  Result Value Ref Range Status   Specimen Description BLOOD BLOOD LEFT HAND  Final   Special Requests   Final    BOTTLES DRAWN AEROBIC AND ANAEROBIC Blood Culture adequate volume   Culture NO GROWTH <12 HOURS  Final   Report Status PENDING  Incomplete  Culture, blood (Routine X 2) w Reflex to ID Panel     Status: None (Preliminary result)   Collection Time: 09/01/17  5:13 AM  Result Value Ref Range Status   Specimen Description BLOOD BLOOD RIGHT HAND  Final   Special Requests   Final    BOTTLES DRAWN AEROBIC AND ANAEROBIC Blood Culture adequate volume   Culture NO GROWTH <12 HOURS  Final   Report Status PENDING  Incomplete    Lab Basic Metabolic Panel:  Recent Labs Lab 08-30-17 1241 08/30/17 0433 08/31/17 1133 08/31/17 1652 09/01/17 0505  NA 141 145  --   --  147*  K 4.0 3.5  --   --  3.5  CL 102 110  --   --  114*  CO2 28 29  --   --  25  GLUCOSE 242* 110*  --   --  142*  BUN 30* 36*  --   --  22*  CREATININE 1.30* 1.23*  --   --  0.82  CALCIUM 11.2* 9.4  --   --  9.0  MG  --   --  1.6* 1.7 1.5*  PHOS  --   --  2.0* 2.2* 2.1*   Liver Function Tests:  Recent Labs Lab 2017/08/30 1241 09/01/17 0505  AST 28 36  ALT 15 19  ALKPHOS 91 58  BILITOT 0.9 0.6  PROT 7.1 5.6*  ALBUMIN 3.3* 2.4*   No results for input(s): LIPASE, AMYLASE in the last 168 hours. No results for input(s): AMMONIA in the last 168 hours. CBC:  Recent Labs Lab 08-30-2017 1241 08/30/17 0037 08/30/17 0433 08/31/17 0419 09/01/17 0505  WBC 14.4*  --  9.5 9.9 10.9  NEUTROABS 11.6*  --   --   --   --   HGB 14.2 13.0 12.4 11.3* 11.1*  HCT 43.9 39.6 37.3 33.9* 33.8*  MCV 92.4  --  91.2 91.7 93.7  PLT 217  --  163 131* 127*   Cardiac Enzymes:  Recent Labs Lab 2017-08-30 1241 08/30/17 1740 08/30/17 0433  TROPONINI 0.08* 0.04* 0.04*   Sepsis Labs:  Recent Labs Lab 2017-08-30 1241 08/30/17 1740 08/30/17 0433 08/30/17 1100 08/31/17 0419 09/01/17 0505  PROCALCITON   --  0.42  --   --   --  0.26  WBC 14.4*  --  9.5  --  9.9 10.9  LATICACIDVEN 3.1* 3.6*  --  1.8  --  1.1    Procedures/Operations  Mechanical Intubation Aug 30, 2017 Extubation 09/01/2017  Sonda Rumble, AGNP  Pulmonary/Critical Care Pager [phone] (please enter 7 digits) PCCM Consult Pager [phone] (please enter 7 digits)

## 2017-09-18 NOTE — Progress Notes (Signed)
Pt time of death 0151, pronounced by NP. Writing RN and NP and pt sister at bedside during time of death. Comfort provided to sister. Sister called and informed other siblings who she said would not be coming tonight. [facility] Darol Destine, contacted and informed of patient passing. Sister stated patient had already made own funeral arrangements in previous years, but unaware of where they are held. House supervisor informed of situation and given [employer] contact info for planning tomorrow.

## 2017-09-18 DEATH — deceased

## 2017-10-30 DIAGNOSIS — Z7189 Other specified counseling: Secondary | ICD-10-CM

## 2017-10-30 DIAGNOSIS — T17908A Unspecified foreign body in respiratory tract, part unspecified causing other injury, initial encounter: Secondary | ICD-10-CM

## 2017-10-30 DIAGNOSIS — Z515 Encounter for palliative care: Secondary | ICD-10-CM

## 2018-08-02 IMAGING — CT CT HEAD W/O CM
4 of 5 series · 15 of 47 positions shown, 16 images · non-contrast
Comparison: 08/19/2017

CLINICAL DATA: Ourari Tiger is a 89 y.o. female with a known
history of Dementia, essential hypertension, osteoporosis, who was
admitted recently for agitation and was noted to have a urinary
tract infection.
TECHNIQUE: Contiguous axial images were obtained from the base of the skull
through the vertex without intravenous contrast.

[Series 2: head wo · axial · 0.47mm/px · z∈[+38,+128]mm · 4 of 30 slices shown, 5 images (1 of 2)]
[im 6/30  brain]
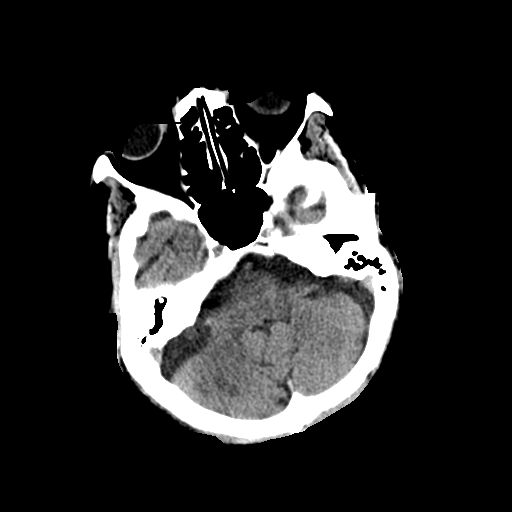
[im 6/30  bone]
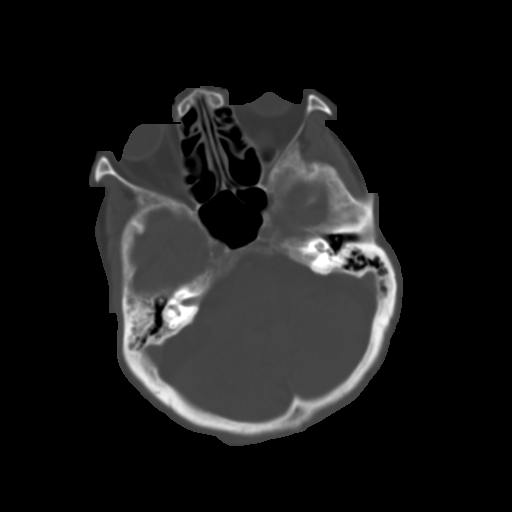
[im 12/30  brain]
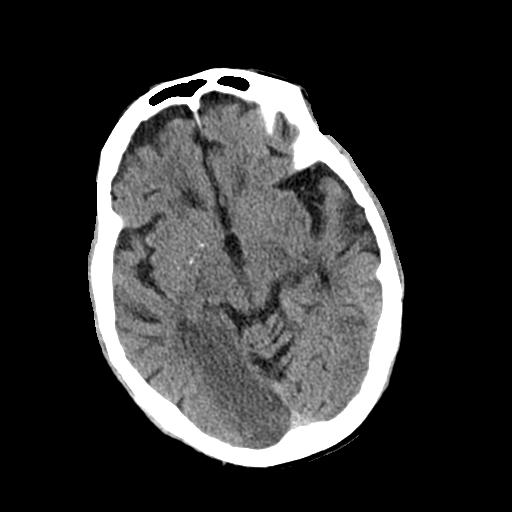
[im 18/30  brain]
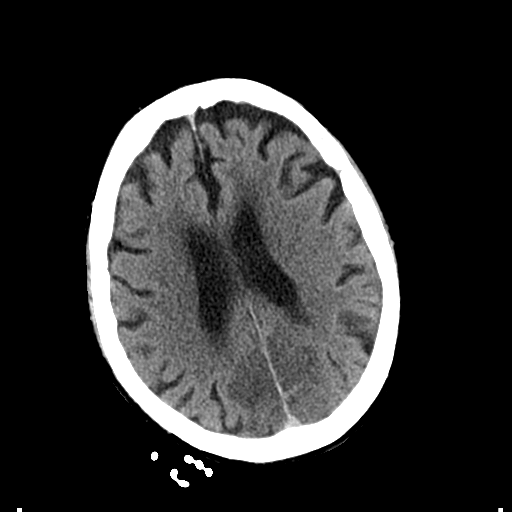
[im 24/30  brain]
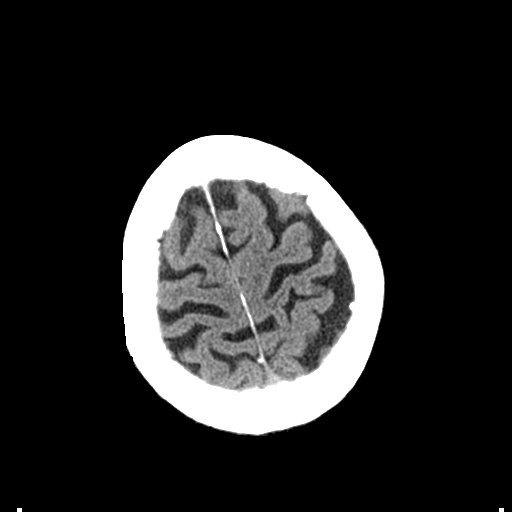

[Series 4: head wo · axial · 0.35mm/px · z∈[+54,+149]mm · 5 of 30 slices shown (2 of 2)]
[im 5/30  brain]
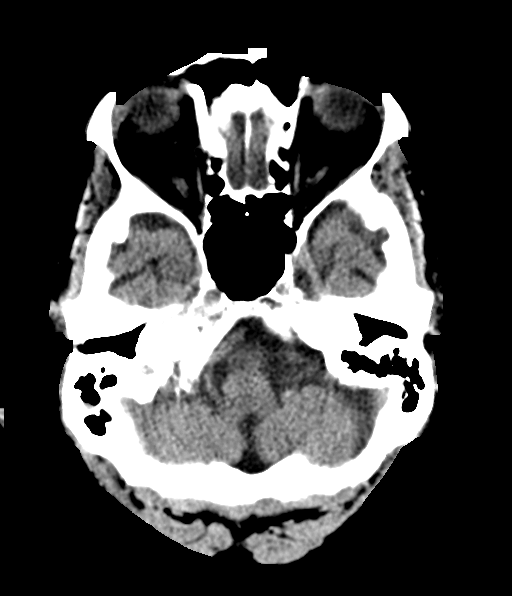
[im 10/30  brain]
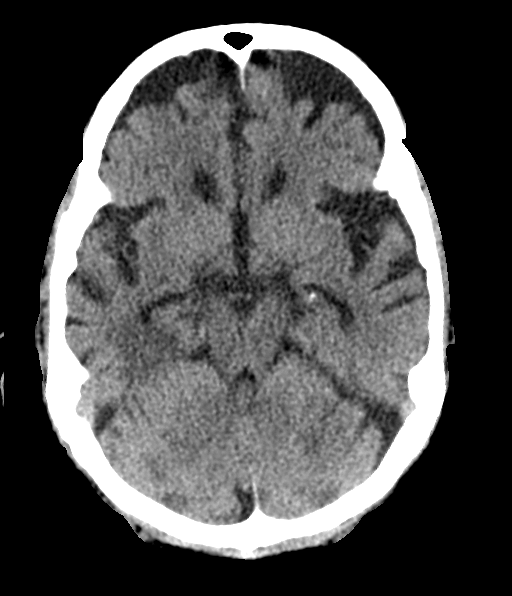
[im 15/30  brain]
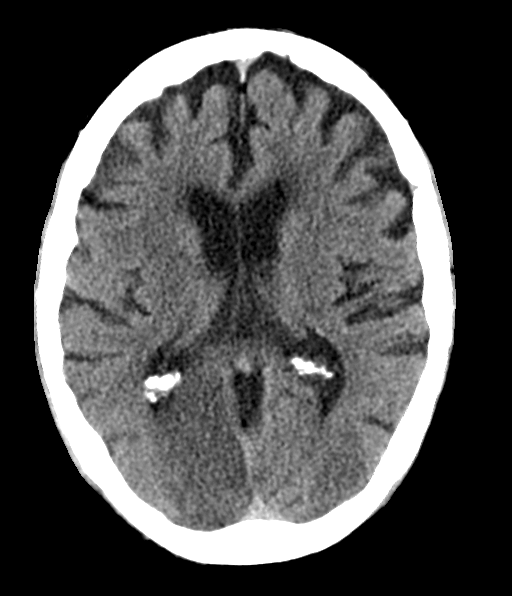
[im 20/30  brain]
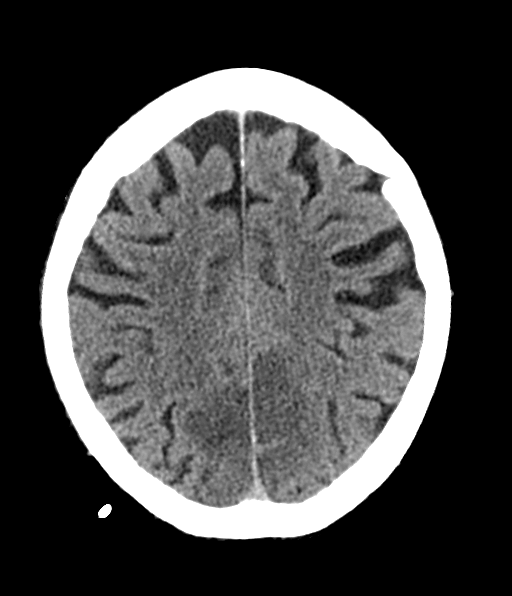
[im 25/30  brain]
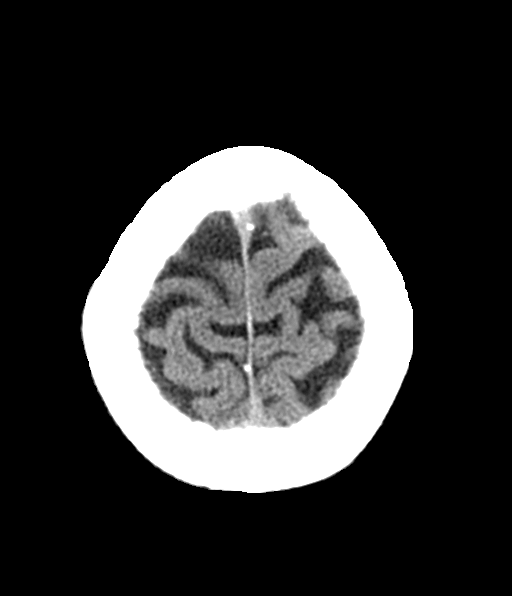

[Series 6: coronal soft tissue · coronal · 0.30mm/px · 3 of 67 slices shown]
[im 23/67  brain]
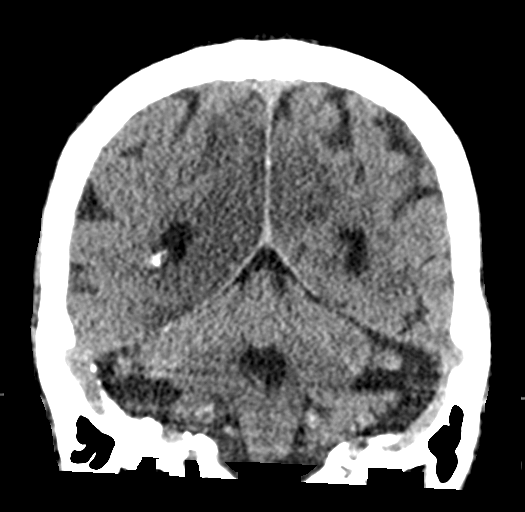
[im 30/67  brain]
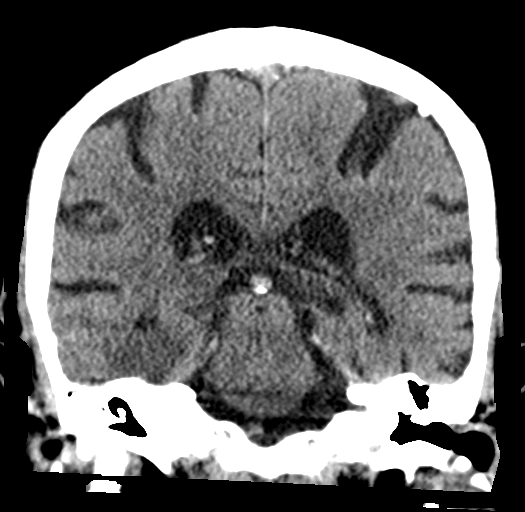
[im 37/67  brain]
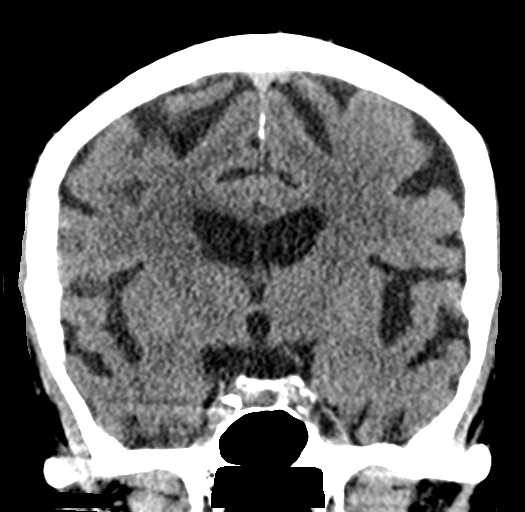

[Series 7: sagittal soft tissue · sagittal · 0.30mm/px · 3 of 55 slices shown]
[im 19/55  brain]
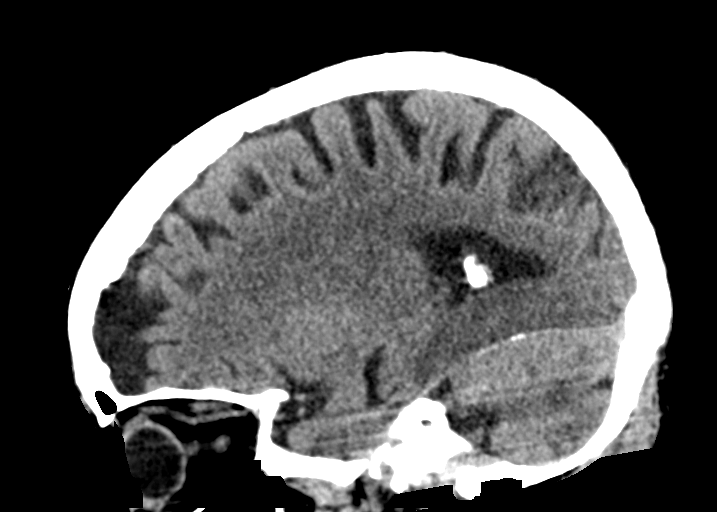
[im 28/55  brain]
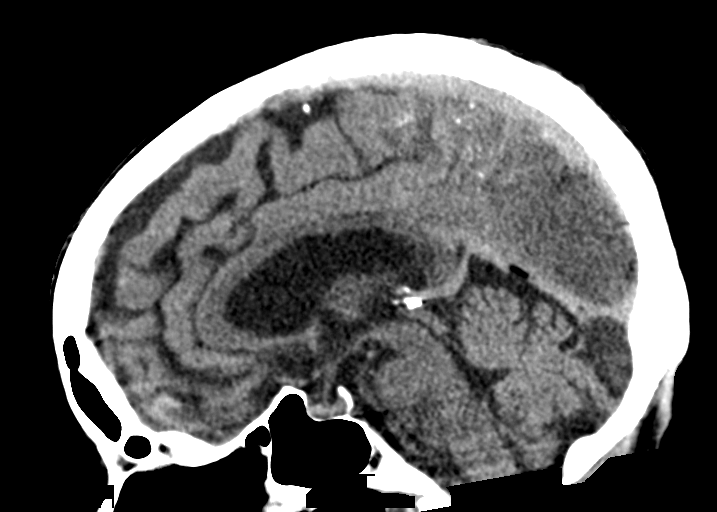
[im 37/55  brain]
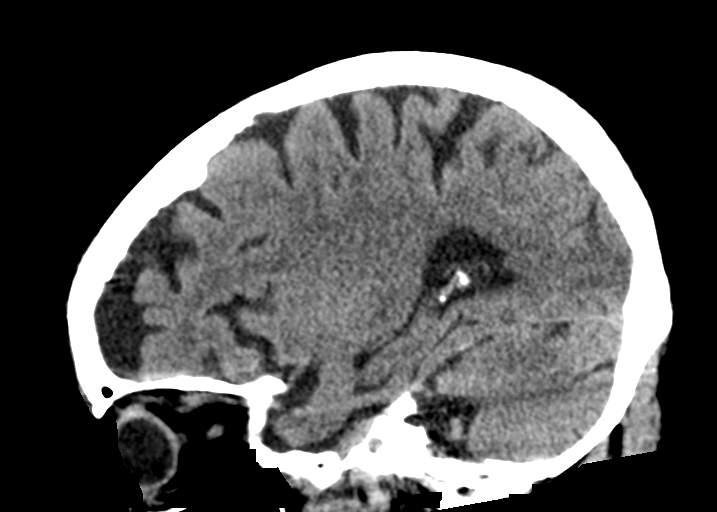

[15 of 47 positions shown; findings below may reference images not displayed]

Mental status improved and she was discharged back to [REDACTED]. Patient was found unresponsive in had negative
adequate breathing, sloughed in her wheelchair at the facility.
Emesis and questionable aspiration. In the ER, she has poor pulses
and bradycardic. Intubated and paced twice.

EXAM:
CT HEAD WITHOUT CONTRAST
FINDINGS: Brain: There is interval subacute to acute infarct involving the
right occipital lobe extending into the mesial temporal lobe. A
smaller subacute to acute infarct involves the left occipital lobe.
There is no associated hemorrhage. No significant mass effect.

There is significant central and cortical atrophy. Periventricular
white matter changes are consistent with small vessel disease.
Suspect small remote infarct of the right inferior cerebellum.

Vascular: Significant atherosclerotic calcification of the internal
carotid arteries.

Skull: Normal. Negative for fracture or focal lesion.

Sinuses/Orbits: Paranasal sinuses and orbits are unremarkable. Small
right mastoid effusion is present.

Other: None
IMPRESSION: 1. Interval development of right occipital infarct extending into to
the right mesial temporal lobe.
2. Interval development of small left occipital infarct.
3. No associated hemorrhage.
4. Atrophy and small vessel disease.
5. Suspect remote infarct of the right inferior cerebellum.
6. Critical Value/emergent results were called by telephone at the
time of interpretation on 08/29/2017 at [DATE] to [place], the
patient's provider in the ICU, who verbally acknowledged these
results.
